# Patient Record
Sex: Male | Born: 1985
Health system: Southern US, Community
[De-identification: ages and names within clinical notes are randomized; demographics above are authoritative.]

## PROBLEM LIST (undated history)

## (undated) ENCOUNTER — Ambulatory Visit: Admission: EM | Payer: BC Managed Care – PPO | Source: Home / Self Care

## (undated) DIAGNOSIS — J189 Pneumonia, unspecified organism: Secondary | ICD-10-CM

## (undated) DIAGNOSIS — F419 Anxiety disorder, unspecified: Secondary | ICD-10-CM

## (undated) DIAGNOSIS — I10 Essential (primary) hypertension: Secondary | ICD-10-CM

## (undated) DIAGNOSIS — R740 Nonspecific elevation of levels of transaminase and lactic acid dehydrogenase [LDH]: Secondary | ICD-10-CM

## (undated) DIAGNOSIS — F909 Attention-deficit hyperactivity disorder, unspecified type: Secondary | ICD-10-CM

## (undated) DIAGNOSIS — E669 Obesity, unspecified: Secondary | ICD-10-CM

## (undated) DIAGNOSIS — E781 Pure hyperglyceridemia: Secondary | ICD-10-CM

## (undated) HISTORY — DX: Anxiety disorder, unspecified: F41.9

## (undated) HISTORY — PX: LEG SURGERY: SHX1003

## (undated) HISTORY — DX: Attention-deficit hyperactivity disorder, unspecified type: F90.9

## (undated) HISTORY — DX: Obesity, unspecified: E66.9

## (undated) HISTORY — DX: Essential (primary) hypertension: I10

## (undated) HISTORY — PX: WRIST ARTHROSCOPY: SUR100

## (undated) HISTORY — DX: Nonspecific elevation of levels of transaminase and lactic acid dehydrogenase (ldh): R74.0

---
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
## 2006-08-31 HISTORY — PX: FRACTURE SURGERY: SHX138

## 2006-12-27 ENCOUNTER — Emergency Department (HOSPITAL_COMMUNITY): Admission: EM | Admit: 2006-12-27 | Discharge: 2006-12-27 | Payer: Self-pay | Admitting: Emergency Medicine

## 2014-01-25 ENCOUNTER — Encounter: Payer: Self-pay | Admitting: Emergency Medicine

## 2014-01-25 ENCOUNTER — Emergency Department (INDEPENDENT_AMBULATORY_CARE_PROVIDER_SITE_OTHER)
Admission: EM | Admit: 2014-01-25 | Discharge: 2014-01-25 | Disposition: A | Discharge status: Home / Self Care | Payer: PRIVATE HEALTH INSURANCE | Source: Home / Self Care | Attending: Emergency Medicine | Admitting: Emergency Medicine

## 2014-01-25 DIAGNOSIS — J069 Acute upper respiratory infection, unspecified: Secondary | ICD-10-CM

## 2014-01-25 MED ORDER — GUAIFENESIN-CODEINE 100-10 MG/5ML PO SYRP: 5.0000 mL | ORAL_SOLUTION | Freq: Four times a day (QID) | ORAL | Status: DC | PRN

## 2014-01-25 MED ORDER — AMOXICILLIN-POT CLAVULANATE 875-125 MG PO TABS: 1.0000 | ORAL_TABLET | Freq: Two times a day (BID) | ORAL | Status: DC

## 2014-01-25 NOTE — ED Notes (Signed)
Dry cough x 3 weeks, body aches, LBP from coughing, hoarseness

## 2014-01-25 NOTE — ED Provider Notes (Signed)
CSN: [id number]     Arrival date & time 01/25/14  1853 History   First MD Initiated Contact with Patient 01/25/14 1857     Chief Complaint  Patient presents with  . Cough   (Consider location/radiation/quality/duration/timing/severity/associated sxs/prior Treatment) HPI Luke Wilkerson is a 28 y.o. male who complains of onset of cold symptoms for 3 weeks.  The symptoms are constant and mild-moderate in severity.  No history of allergic rhinitis.  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  Lots of sick contacts recently.  Using Sudafed which helps. No sore throat + dry cough  No pleuritic pain No wheezing + nasal congestion + post-nasal drainage + hoarseness + sinus pain/pressure +/- chest congestion No itchy/red eyes No earache No hemoptysis No SOB +/- chills/sweats No fever No nausea No vomiting No abdominal pain No diarrhea No skin rashes No fatigue No myalgias No headache     History reviewed. No pertinent past medical history. Past Surgical History  Procedure Laterality Date  . Wrist arthroscopy    . Leg surgery     Family History  Problem Relation Age of Onset  . Hypertension Mother   . Diabetes Father    History  Substance Use Topics  . Smoking status: Current Every Day Smoker  . Smokeless tobacco: Not on file  . Alcohol Use: Yes    Review of Systems  All other systems reviewed and are negative.   Allergies  Review of patient's allergies indicates no known allergies.  Home Medications   Prior to Admission medications   Medication Sig Start Date End Date Taking? Authorizing Provider  amoxicillin-clavulanate (AUGMENTIN) 875-125 MG per tablet Take 1 tablet by mouth 2 (two) times daily. 01/25/14   Marlaine Hind, MD  guaiFENesin-codeine [facility]) 100-10 MG/5ML syrup Take 5 mLs by mouth 4 (four) times daily as needed for cough or congestion. 01/25/14   Marlaine Hind, MD   BP 134/92  Pulse 92  Temp(Src) 98 F (36.7 C) (Oral)  Ht  5\' 11"  (1.803 m)  Wt 228 lb (103.42 kg)  BMI 31.81 kg/m2  SpO2 98% Physical Exam  Nursing note and vitals reviewed. Constitutional: He is oriented to person, place, and time. He appears well-developed and well-nourished.  HENT:  Head: Normocephalic and atraumatic.  Right Ear: Tympanic membrane, external ear and ear canal normal.  Left Ear: Tympanic membrane, external ear and ear canal normal.  Nose: Mucosal edema and rhinorrhea present.  Mouth/Throat: Posterior oropharyngeal erythema present. No oropharyngeal exudate or posterior oropharyngeal edema.  Eyes: No scleral icterus.  Neck: Neck supple.  Cardiovascular: Regular rhythm and normal heart sounds.   Pulmonary/Chest: Effort normal and breath sounds normal. No respiratory distress. He has no decreased breath sounds. He has no wheezes. He has no rhonchi.  Neurological: He is alert and oriented to person, place, and time.  Skin: Skin is warm and dry.  Psychiatric: He has a normal mood and affect. His speech is normal.    ED Course  Procedures (including critical care time) Labs Review Labs Reviewed - No data to display  Imaging Review No results found.   MDM   1. Acute upper respiratory infections of unspecified site    1)  Take the prescribed antibiotic as instructed.  If not improving, would suggest CXR since frequent exposure to fumes.  Pt understands. 2)  Use nasal saline solution (over the counter) at least 3 times a day. 3)  Use over the counter decongestants like Zyrtec-D every 12  hours as needed to help with congestion.  If you have hypertension, do not take medicines with sudafed.  4)  Can take tylenol every 6 hours or motrin every 8 hours for pain or fever. 5)  Follow up with your primary doctor if no improvement in 5-7 days, sooner if increasing pain, fever, or new symptoms.     Marlaine HindJeffrey H Bradd Merlos, MD 01/25/14 [phone]

## 2014-02-08 DIAGNOSIS — M549 Dorsalgia, unspecified: Secondary | ICD-10-CM | POA: Insufficient documentation

## 2014-08-01 ENCOUNTER — Ambulatory Visit
Admission: RE | Admit: 2014-08-01 | Discharge: 2014-08-01 | Disposition: A | Discharge status: Home / Self Care | Payer: Worker's Compensation | Source: Ambulatory Visit | Attending: Internal Medicine | Admitting: Internal Medicine

## 2014-08-01 ENCOUNTER — Other Ambulatory Visit: Payer: Self-pay | Admitting: Internal Medicine

## 2014-08-01 DIAGNOSIS — M79645 Pain in left finger(s): Secondary | ICD-10-CM

## 2014-08-01 DIAGNOSIS — M7989 Other specified soft tissue disorders: Secondary | ICD-10-CM

## 2014-08-31 DIAGNOSIS — J189 Pneumonia, unspecified organism: Secondary | ICD-10-CM

## 2014-08-31 HISTORY — PX: FRACTURE SURGERY: SHX138

## 2014-08-31 HISTORY — DX: Pneumonia, unspecified organism: J18.9

## 2014-12-06 ENCOUNTER — Emergency Department (HOSPITAL_BASED_OUTPATIENT_CLINIC_OR_DEPARTMENT_OTHER)
Admission: EM | Admit: 2014-12-06 | Discharge: 2014-12-06 | Disposition: A | Discharge status: Home / Self Care | Payer: 59 | Attending: Emergency Medicine | Admitting: Emergency Medicine

## 2014-12-06 ENCOUNTER — Emergency Department (HOSPITAL_BASED_OUTPATIENT_CLINIC_OR_DEPARTMENT_OTHER): Payer: 59

## 2014-12-06 ENCOUNTER — Encounter (HOSPITAL_BASED_OUTPATIENT_CLINIC_OR_DEPARTMENT_OTHER): Payer: Self-pay | Admitting: Emergency Medicine

## 2014-12-06 DIAGNOSIS — Y9389 Activity, other specified: Secondary | ICD-10-CM | POA: Insufficient documentation

## 2014-12-06 DIAGNOSIS — Z720 Tobacco use: Secondary | ICD-10-CM | POA: Diagnosis not present

## 2014-12-06 DIAGNOSIS — S82832A Other fracture of upper and lower end of left fibula, initial encounter for closed fracture: Secondary | ICD-10-CM | POA: Insufficient documentation

## 2014-12-06 DIAGNOSIS — X58XXXA Exposure to other specified factors, initial encounter: Secondary | ICD-10-CM | POA: Diagnosis not present

## 2014-12-06 DIAGNOSIS — Y9289 Other specified places as the place of occurrence of the external cause: Secondary | ICD-10-CM | POA: Diagnosis not present

## 2014-12-06 DIAGNOSIS — S99922A Unspecified injury of left foot, initial encounter: Secondary | ICD-10-CM | POA: Diagnosis present

## 2014-12-06 DIAGNOSIS — Z792 Long term (current) use of antibiotics: Secondary | ICD-10-CM | POA: Insufficient documentation

## 2014-12-06 DIAGNOSIS — Y998 Other external cause status: Secondary | ICD-10-CM | POA: Diagnosis not present

## 2014-12-06 DIAGNOSIS — S82402A Unspecified fracture of shaft of left fibula, initial encounter for closed fracture: Secondary | ICD-10-CM

## 2014-12-06 MED ORDER — OXYCODONE-ACETAMINOPHEN 5-325 MG PO TABS: 1.0000 | ORAL_TABLET | Freq: Four times a day (QID) | ORAL | Status: DC | PRN

## 2014-12-06 MED ORDER — KETOROLAC TROMETHAMINE 60 MG/2ML IM SOLN
60.0000 mg | Freq: Once | INTRAMUSCULAR | Status: AC
  Administered 2014-12-06: 60 mg via INTRAMUSCULAR
  Filled 2014-12-06: qty 2

## 2014-12-06 NOTE — ED Provider Notes (Signed)
CSN: [id number]     Arrival date & time 12/06/14  0131 History   First MD Initiated Contact with Patient 12/06/14 0147     Chief Complaint  Patient presents with  . Foot Injury     (Consider location/radiation/quality/duration/timing/severity/associated sxs/prior Treatment) Patient is a 29 y.o. male presenting with foot injury. The history is provided by the patient.  Foot Injury Location:  Ankle Time since incident:  1 hour Injury: yes   Mechanism of injury comment:  Slipped in mud twisting left ankle Ankle location:  L ankle Pain details:    Quality:  Aching   Radiates to:  Does not radiate   Severity:  Severe   Onset quality:  Sudden   Timing:  Constant   Progression:  Unchanged Chronicity:  New Dislocation: no   Foreign body present:  No foreign bodies Prior injury to area:  No Relieved by:  Nothing Worsened by:  Nothing tried Ineffective treatments:  None tried Associated symptoms: swelling   Associated symptoms: no back pain, no muscle weakness, no numbness, no stiffness and no tingling   Risk factors: no concern for non-accidental trauma     History reviewed. No pertinent past medical history. Past Surgical History  Procedure Laterality Date  . Wrist arthroscopy    . Leg surgery     Family History  Problem Relation Age of Onset  . Hypertension Mother   . Diabetes Father    History  Substance Use Topics  . Smoking status: Current Every Day Smoker -- 1.00 packs/day  . Smokeless tobacco: Not on file  . Alcohol Use: Yes    Review of Systems  Musculoskeletal: Negative for back pain and stiffness.  All other systems reviewed and are negative.     Allergies  Review of patient's allergies indicates no known allergies.  Home Medications   Prior to Admission medications   Medication Sig Start Date End Date Taking? Authorizing Provider  amoxicillin-clavulanate (AUGMENTIN) 875-125 MG per tablet Take 1 tablet by mouth 2 (two) times daily. 01/25/14   Marlaine HindJeffrey  H Henderson, MD  guaiFENesin-codeine [facility](ROBITUSSIN AC) 100-10 MG/5ML syrup Take 5 mLs by mouth 4 (four) times daily as needed for cough or congestion. 01/25/14   Marlaine HindJeffrey H Henderson, MD  oxyCODONE-acetaminophen (PERCOCET) 5-325 MG per tablet Take 1 tablet by mouth every 6 (six) hours as needed. 12/06/14   Meela Wareing, MD   BP 137/69 mmHg  Pulse 95  Temp(Src) 98.5 F (36.9 C) (Oral)  Resp 18  Ht 5\' 11"  (1.803 m)  Wt 215 lb (97.523 kg)  BMI 30.00 kg/m2  SpO2 100% Physical Exam  Constitutional: He is oriented to person, place, and time. He appears well-developed and well-nourished. No distress.  HENT:  Head: Normocephalic and atraumatic.  Mouth/Throat: Oropharynx is clear and moist.  Eyes: Conjunctivae are normal. Pupils are equal, round, and reactive to light.  Neck: Normal range of motion. Neck supple.  Cardiovascular: Normal rate, regular rhythm and intact distal pulses.   Pulmonary/Chest: Effort normal and breath sounds normal. No respiratory distress. He has no wheezes. He has no rales.  Abdominal: Soft. Bowel sounds are normal. There is no tenderness. There is no rebound and no guarding.  Musculoskeletal: Normal range of motion.       Left ankle: He exhibits normal range of motion, no swelling, no ecchymosis, no deformity, no laceration and normal pulse. Tenderness. Lateral malleolus tenderness found. No medial malleolus, no AITFL, no CF ligament, no posterior TFL, no head of 5th metatarsal and  no proximal fibula tenderness found. Achilles tendon normal.  Neurological: He is alert and oriented to person, place, and time. He has normal reflexes.  Skin: Skin is warm and dry.  Psychiatric: He has a normal mood and affect.    ED Course  Procedures (including critical care time) Labs Review Labs Reviewed - No data to display  Imaging Review Dg Ankle Complete Left  12/06/2014   CLINICAL DATA:  Fall into [place] with left ankle pain and swelling. Initial encounter.  EXAM: LEFT ANKLE  COMPLETE - 3+ VIEW  COMPARISON:  None.  FINDINGS: There is a coronal oblique fracture of the distal fibula which is essentially nondisplaced. The fracture is above the level of the ankle joint. There is no medial malleolus fracture or medial clear space widening. Hindfoot alignment is normal.  IMPRESSION: 1. Nondisplaced distal fibular fracture. 2. No medial malleolus fracture or ankle mortise widening.   Electronically Signed   By: Marnee Spring M.D.   On: 12/06/2014 02:08   Dg Foot Complete Left  12/06/2014   CLINICAL DATA:  Fall into [place] with left ankle pain and swelling. Initial encounter.  EXAM: LEFT FOOT - COMPLETE 3+ VIEW  COMPARISON:  None currently available  FINDINGS: There is a coronal oblique fracture of the distal fibula which is above the level of the ankle joint.  No evidence of foot fracture or malalignment.  IMPRESSION: 1. Distal fibular fracture, reference ankle imaging. 2. No foot fracture or malalignment.   Electronically Signed   By: Marnee Spring M.D.   On: 12/06/2014 02:06     EKG Interpretation None      MDM   Final diagnoses:  Left fibular fracture, closed, initial encounter   Patient has been drinking this evening.  Will write for percocet as patient states norco does not work, patient is not allowed to drink alcohol, drive or operate heavy machinery on this medication.  Expresses understanding.  Referral to orthopedics.    Cy Blamer, MD 12/06/14 1610

## 2014-12-06 NOTE — Discharge Instructions (Signed)

## 2014-12-06 NOTE — ED Notes (Signed)
Slipped and twisted left ankle  Heard pop  Swelling to ankle  Ice applied

## 2014-12-06 NOTE — ED Notes (Signed)
Pt [place] that [place] and heard pop in left foot

## 2014-12-17 ENCOUNTER — Ambulatory Visit
Admission: RE | Admit: 2014-12-17 | Discharge: 2014-12-17 | Disposition: A | Discharge status: Home / Self Care | Payer: PRIVATE HEALTH INSURANCE | Source: Ambulatory Visit | Attending: Orthopedic Surgery | Admitting: Orthopedic Surgery

## 2014-12-17 ENCOUNTER — Other Ambulatory Visit: Payer: Self-pay | Admitting: Orthopedic Surgery

## 2014-12-17 DIAGNOSIS — Z77018 Contact with and (suspected) exposure to other hazardous metals: Secondary | ICD-10-CM

## 2017-03-12 ENCOUNTER — Ambulatory Visit (INDEPENDENT_AMBULATORY_CARE_PROVIDER_SITE_OTHER): Payer: PRIVATE HEALTH INSURANCE | Admitting: Physician Assistant

## 2017-03-12 ENCOUNTER — Encounter: Payer: Self-pay | Admitting: Physician Assistant

## 2017-03-12 VITALS — BP 145/90 | HR 76 | Ht 71.0 in | Wt 239.0 lb

## 2017-03-12 DIAGNOSIS — Z789 Other specified health status: Secondary | ICD-10-CM | POA: Insufficient documentation

## 2017-03-12 DIAGNOSIS — F909 Attention-deficit hyperactivity disorder, unspecified type: Secondary | ICD-10-CM | POA: Diagnosis not present

## 2017-03-12 DIAGNOSIS — Z9189 Other specified personal risk factors, not elsewhere classified: Secondary | ICD-10-CM

## 2017-03-12 DIAGNOSIS — F4010 Social phobia, unspecified: Secondary | ICD-10-CM

## 2017-03-12 DIAGNOSIS — Z7689 Persons encountering health services in other specified circumstances: Secondary | ICD-10-CM

## 2017-03-12 DIAGNOSIS — R030 Elevated blood-pressure reading, without diagnosis of hypertension: Secondary | ICD-10-CM

## 2017-03-12 DIAGNOSIS — Z131 Encounter for screening for diabetes mellitus: Secondary | ICD-10-CM

## 2017-03-12 DIAGNOSIS — Z720 Tobacco use: Secondary | ICD-10-CM | POA: Diagnosis not present

## 2017-03-12 DIAGNOSIS — R0683 Snoring: Secondary | ICD-10-CM | POA: Diagnosis not present

## 2017-03-12 DIAGNOSIS — F331 Major depressive disorder, recurrent, moderate: Secondary | ICD-10-CM

## 2017-03-12 DIAGNOSIS — Z113 Encounter for screening for infections with a predominantly sexual mode of transmission: Secondary | ICD-10-CM

## 2017-03-12 DIAGNOSIS — Z13220 Encounter for screening for lipoid disorders: Secondary | ICD-10-CM

## 2017-03-12 MED ORDER — AMPHETAMINE-DEXTROAMPHETAMINE 20 MG PO TABS: 20.0000 mg | ORAL_TABLET | ORAL | 0 refills | Status: DC

## 2017-03-12 MED ORDER — ESCITALOPRAM OXALATE 20 MG PO TABS: ORAL_TABLET | ORAL | 1 refills | Status: DC

## 2017-03-12 NOTE — Patient Instructions (Addendum)
For your blood pressure: - Check blood pressure at home for the next 2 weeks - Check around the same time each day in a relaxed setting  - Limit salt. Follow DASH eating plan - Follow-up in 2 weeks  For mood: - start escitalopram (Lexapro) 1/2 tablet every morning for 1 week. Then increase to the full tablet every morning - cut back on alcohol to no more than 2 standard sized drinks per day - follow-up in 4 weeks  For ADHD: - Adderall 20mg  every morning. Monitor your blood pressure closely. Stop if you develop any symptoms of hypertensive emergency such as headache, vision change, chest pain, difficulty breathing    DASH Eating Plan DASH stands for "Dietary Approaches to Stop Hypertension." The DASH eating plan is a healthy eating plan that has been shown to reduce high blood pressure (hypertension). It may also reduce your risk for type 2 diabetes, heart disease, and stroke. The DASH eating plan may also help with weight loss. What are tips for following this plan? General guidelines  Avoid eating more than 2,300 mg (milligrams) of salt (sodium) a day. If you have hypertension, you may need to reduce your sodium intake to 1,500 mg a day.  Limit alcohol intake to no more than 1 drink a day for nonpregnant women and 2 drinks a day for men. One drink equals 12 oz of beer, 5 oz of wine, or 1 oz of hard liquor.  Work with your health care provider to maintain a healthy body weight or to lose weight. Ask what an ideal weight is for you.  Get at least 30 minutes of exercise that causes your heart to beat faster (aerobic exercise) most days of the week. Activities may include walking, swimming, or biking.  Work with your health care provider or diet and nutrition specialist (dietitian) to adjust your eating plan to your individual calorie needs. Reading food labels  Check food labels for the amount of sodium per serving. Choose foods with less than 5 percent of the Daily Value of sodium.  Generally, foods with less than 300 mg of sodium per serving fit into this eating plan.  To find whole grains, look for the word "whole" as the first word in the ingredient list. Shopping  Buy products labeled as "low-sodium" or "no salt added."  Buy fresh foods. Avoid canned foods and premade or frozen meals. Cooking  Avoid adding salt when cooking. Use salt-free seasonings or herbs instead of table salt or sea salt. Check with your health care provider or pharmacist before using salt substitutes.  Do not fry foods. Cook foods using healthy methods such as baking, boiling, grilling, and broiling instead.  Cook with heart-healthy oils, such as olive, canola, soybean, or sunflower oil. Meal planning   Eat a balanced diet that includes: ? 5 or more servings of fruits and vegetables each day. At each meal, try to fill half of your plate with fruits and vegetables. ? Up to 6-8 servings of whole grains each day. ? Less than 6 oz of lean meat, poultry, or fish each day. A 3-oz serving of meat is about the same size as a deck of cards. One egg equals 1 oz. ? 2 servings of low-fat dairy each day. ? A serving of nuts, seeds, or beans 5 times each week. ? Heart-healthy fats. Healthy fats called Omega-3 fatty acids are found in foods such as flaxseeds and coldwater fish, like sardines, salmon, and mackerel.  Limit how much you eat  of the following: ? Canned or prepackaged foods. ? Food that is high in trans fat, such as fried foods. ? Food that is high in saturated fat, such as fatty meat. ? Sweets, desserts, sugary drinks, and other foods with added sugar. ? Full-fat dairy products.  Do not salt foods before eating.  Try to eat at least 2 vegetarian meals each week.  Eat more home-cooked food and less restaurant, buffet, and fast food.  When eating at a restaurant, ask that your food be prepared with less salt or no salt, if possible. What foods are recommended? The items listed may  not be a complete list. Talk with your dietitian about what dietary choices are best for you. Grains Whole-grain or whole-wheat bread. Whole-grain or whole-wheat pasta. Brown rice. [place]. Bulgur. Whole-grain and low-sodium cereals. Pita bread. Low-fat, low-sodium crackers. Whole-wheat flour tortillas. Vegetables Fresh or frozen vegetables (raw, steamed, roasted, or grilled). Low-sodium or reduced-sodium tomato and vegetable juice. Low-sodium or reduced-sodium tomato sauce and tomato paste. Low-sodium or reduced-sodium canned vegetables. Fruits All fresh, dried, or frozen fruit. Canned fruit in natural juice (without added sugar). Meat and other protein foods Skinless chicken or [place]. Ground chicken or [place]. Pork with fat trimmed off. Fish and seafood. Egg whites. Dried beans, peas, or lentils. Unsalted nuts, nut butters, and seeds. Unsalted canned beans. Lean cuts of beef with fat trimmed off. Low-sodium, lean deli meat. Dairy Low-fat (1%) or fat-free (skim) milk. Fat-free, low-fat, or reduced-fat cheeses. Nonfat, low-sodium ricotta or cottage cheese. Low-fat or nonfat yogurt. Low-fat, low-sodium cheese. Fats and oils Soft margarine without trans fats. Vegetable oil. Low-fat, reduced-fat, or light mayonnaise and salad dressings (reduced-sodium). Canola, safflower, olive, soybean, and sunflower oils. Avocado. Seasoning and other foods Herbs. Spices. Seasoning mixes without salt. Unsalted popcorn and pretzels. Fat-free sweets. What foods are not recommended? The items listed may not be a complete list. Talk with your dietitian about what dietary choices are best for you. Grains Baked goods made with fat, such as croissants, muffins, or some breads. Dry pasta or rice meal packs. Vegetables Creamed or fried vegetables. Vegetables in a cheese sauce. Regular canned vegetables (not low-sodium or reduced-sodium). Regular canned tomato sauce and paste (not low-sodium or reduced-sodium).  Regular tomato and vegetable juice (not low-sodium or reduced-sodium). Angie Fava. Olives. Fruits Canned fruit in a light or heavy syrup. Fried fruit. Fruit in cream or butter sauce. Meat and other protein foods Fatty cuts of meat. Ribs. Fried meat. Berniece Salines. Sausage. Bologna and other processed lunch meats. Salami. Fatback. Hotdogs. Bratwurst. Salted nuts and seeds. Canned beans with added salt. Canned or smoked fish. Whole eggs or egg yolks. Chicken or [place] with skin. Dairy Whole or 2% milk, cream, and half-and-half. Whole or full-fat cream cheese. Whole-fat or sweetened yogurt. Full-fat cheese. Nondairy creamers. Whipped toppings. Processed cheese and cheese spreads. Fats and oils Butter. Stick margarine. Lard. Shortening. Ghee. Bacon fat. Tropical oils, such as coconut, palm kernel, or palm oil. Seasoning and other foods Salted popcorn and pretzels. Onion salt, garlic salt, seasoned salt, table salt, and sea salt. Worcestershire sauce. Tartar sauce. Barbecue sauce. Teriyaki sauce. Soy sauce, including reduced-sodium. Steak sauce. Canned and packaged gravies. Fish sauce. Oyster sauce. Cocktail sauce. Horseradish that you find on the shelf. Ketchup. Mustard. Meat flavorings and tenderizers. Bouillon cubes. Hot sauce and Tabasco sauce. Premade or packaged marinades. Premade or packaged taco seasonings. Relishes. Regular salad dressings. Where to find more information:  National Heart, Lung, and Blood Institute: https://wilson-eaton.com/  American Heart  Association: www.heart.org Summary  The DASH eating plan is a healthy eating plan that has been shown to reduce high blood pressure (hypertension). It may also reduce your risk for type 2 diabetes, heart disease, and stroke.  With the DASH eating plan, you should limit salt (sodium) intake to 2,300 mg a day. If you have hypertension, you may need to reduce your sodium intake to 1,500 mg a day.  When on the DASH eating plan, aim to eat more fresh fruits and  vegetables, whole grains, lean proteins, low-fat dairy, and heart-healthy fats.  Work with your health care provider or diet and nutrition specialist (dietitian) to adjust your eating plan to your individual calorie needs. This information is not intended to replace advice given to you by your health care provider. Make sure you discuss any questions you have with your health care provider. Document Released: 08/06/2011 Document Revised: 08/10/2016 Document Reviewed: 08/10/2016 Elsevier Interactive Patient Education  2017 [employer]

## 2017-03-12 NOTE — Progress Notes (Signed)
HPI:                                                                Luke Wilkerson is a 31 y.o. male who presents to [facility]: Primary Care Sports Medicine today to establish care  Current Concerns include ADHD  ADHD: documented history. Has been on Adderall 20mg  immediate release in the past. Has not seen in a doctor in 2 years, so has been off of his medication. States he is starting a new job next week and is worried about poor concentration.  Depression/Anxiety: patient reports history of social anxiety. States he has been avoiding social situations more than usual lately. Endorses depressed mood and anhedonia for a few months. Denies symptoms of mania/hypomania. Denies suicidal thinking. Denies auditory/visual hallucinations.   Patient is also requesting STI screening today. He is sexually active with 1 male partner. They do not use condoms.  Health Maintenance Health Maintenance  Topic Date Due  . HIV Screening  02/22/2002  . INFLUENZA VACCINE  03/31/2017  . TETANUS/TDAP  03/02/2019    Past Medical History:  Diagnosis Date  . ADHD   . Anxiety   . Hypertension   . Obesity    Past Surgical History:  Procedure Laterality Date  . LEG SURGERY    . WRIST ARTHROSCOPY     Social History  Substance Use Topics  . Smoking status: Current Every Day Smoker    Packs/day: 1.00  . Smokeless tobacco: Never Used  . Alcohol use Yes   family history includes Diabetes in his father; Hyperlipidemia in his mother; Hypertension in his mother.  ROS: Review of Systems  Constitutional: Positive for diaphoresis.  HENT: Positive for hearing loss.   Eyes: Positive for blurred vision.  Respiratory: Positive for wheezing ("while sleeping").        + snoring  Genitourinary: Positive for flank pain (right).  Neurological: Positive for dizziness ("after sexual intercourse").  Psychiatric/Behavioral: The patient is nervous/anxious and has insomnia.      Medications: Current Outpatient Prescriptions  Medication Sig Dispense Refill  . amphetamine-dextroamphetamine (ADDERALL) 20 MG tablet Take 1 tablet (20 mg total) by mouth every morning. 30 tablet 0  . escitalopram (LEXAPRO) 20 MG tablet One half tab daily for a week then one tab by mouth daily 30 tablet 1   No current facility-administered medications for this visit.    No Known Allergies   Objective:  BP (!) 145/90   Pulse 76   Ht 5\' 11"  (1.803 m)   Wt 239 lb (108.4 kg)   BMI 33.33 kg/m  Gen: well-groomed, cooperative, not ill-appearing, no distress HEENT: normal conjunctiva, TM's clear, oropharynx clear, moist mucus membranes, no thyromegaly or tenderness Pulm: Normal work of breathing, normal phonation, clear to auscultation bilaterally CV: Normal rate, regular rhythm, s1 and s2 distinct, no murmurs, clicks or rubs, no carotid bruit GI: abdomen soft, nondistended, nontender, no masses Neuro: alert and oriented x 3, EOM's intact, PERRLA, DTR's intact, normal tone, no tremor MSK: moving all extremities, normal gait and station, no peripheral edema Skin: warm and dry, no rashes or lesions on exposed skin Psych: normal affect, euthymic mood, normal speech and thought content    No results found.  Depression screen [facility] 2/9 03/12/2017  Decreased  Interest 3  Down, Depressed, Hopeless 1  PHQ - 2 Score 4  Altered sleeping 3  Tired, decreased energy 3  Change in appetite 3  Feeling bad or failure about yourself  1  Trouble concentrating 1  Moving slowly or fidgety/restless 0  Suicidal thoughts 0  PHQ-9 Score 15   GAD 7 : Generalized Anxiety Score 03/13/2017  Nervous, Anxious, on Edge 3  Control/stop worrying 3  Worry too much - different things 3  Trouble relaxing 3  Restless 3  Easily annoyed or irritable 3  Afraid - awful might happen 3  Total GAD 7 Score 21    Assessment and Plan: 31 y.o. male with   1. Encounter to establish care - reviewed PMH and social  history - reviewed health maintenance  2. Adult ADHD - will monitor HR and blood pressure closely - amphetamine-dextroamphetamine (ADDERALL) 20 MG tablet; Take 1 tablet (20 mg total) by mouth every morning.  Dispense: 30 tablet; Refill: 0  3. Routine screening for STI (sexually transmitted infection) - HIV antibody - Hepatitis C antibody - RPR - GC/Chlamydia Probe Amp  4. Alcohol consumption of more than two drinks per day - CAGE score of 1 - instructed to cut back to 2 standard sized drinks per day  5. Moderate episode of recurrent major depressive disorder (HCC) - PHQ9 score 15 - escitalopram (LEXAPRO) 20 MG tablet; One half tab daily for a week then one tab by mouth daily  Dispense: 30 tablet; Refill: 1  6. Encounter for screening for lipid disorder - Lipid Panel w/reflex Direct LDL  7. Encounter for screening for diabetes mellitus - Hemoglobin A1c  8. Elevated blood pressure reading - patient to check BP's at home for the next 2 weeks - follow-up in 2 weeks for a nurse visit BP check. Bring log - CBC - Comprehensive metabolic panel  9. Social anxiety disorder - GAD7 score 21, severe - starting Lexapro, titrating to 20mg  daily - follow-up in 4 weeks  10. Snoring  11. At risk for obstructive sleep apnea + STOPBANG - will propose sleep study at blood pressure follow-up  12. Tobacco use - patient is not ready to consider smoking cessation   Patient education and anticipatory guidance given Patient agrees with treatment plan Follow-up in 2 weeks or sooner as needed  Levonne Hubertharley E. Cummings PA-C

## 2017-03-13 LAB — GC/CHLAMYDIA PROBE AMP
CT Probe RNA: NOT DETECTED
GC Probe RNA: NOT DETECTED

## 2017-03-24 NOTE — Progress Notes (Signed)
Test negative for gonorrhea and chlamydia Patient did not have his blood drawn

## 2017-03-26 ENCOUNTER — Ambulatory Visit: Payer: Self-pay

## 2017-04-09 ENCOUNTER — Ambulatory Visit (INDEPENDENT_AMBULATORY_CARE_PROVIDER_SITE_OTHER): Payer: 59 | Admitting: Physician Assistant

## 2017-04-09 ENCOUNTER — Encounter: Payer: Self-pay | Admitting: Physician Assistant

## 2017-04-09 VITALS — BP 149/96 | HR 81 | Temp 97.8°F | Wt 239.0 lb

## 2017-04-09 DIAGNOSIS — F331 Major depressive disorder, recurrent, moderate: Secondary | ICD-10-CM | POA: Diagnosis not present

## 2017-04-09 DIAGNOSIS — I10 Essential (primary) hypertension: Secondary | ICD-10-CM

## 2017-04-09 DIAGNOSIS — F4010 Social phobia, unspecified: Secondary | ICD-10-CM

## 2017-04-09 DIAGNOSIS — F909 Attention-deficit hyperactivity disorder, unspecified type: Secondary | ICD-10-CM | POA: Diagnosis not present

## 2017-04-09 MED ORDER — CANDESARTAN CILEXETIL-HCTZ 16-12.5 MG PO TABS: 1.0000 | ORAL_TABLET | Freq: Every day | ORAL | 0 refills | Status: DC

## 2017-04-09 MED ORDER — AMPHETAMINE-DEXTROAMPHETAMINE 20 MG PO TABS: 20.0000 mg | ORAL_TABLET | ORAL | 0 refills | Status: DC

## 2017-04-09 MED ORDER — BUSPIRONE HCL 7.5 MG PO TABS: 7.5000 mg | ORAL_TABLET | Freq: Two times a day (BID) | ORAL | 1 refills | Status: DC

## 2017-04-09 NOTE — Progress Notes (Signed)
HPI:                                                                Bader Stubblefield is a 31 y.o. male who presents to [facility]: [facility] today for anxiety / depression / blood pressure follow-up  HTN: Patient has been monitoring his BP's at home. BP range 140's/80's-90's with a high DBP of 116. Endorses one severe headache that has resolved. Denies vision change, chest pain with exertion, orthopnea, lightheadedness, syncope and edema. Risk factors include: obesity, male sex  Depression/Anxiety: taking Lexapro 20mg  daily without difficulty for 4 weeks. Reports he has noticed no change in his mood. Continues to endorse excessive worry, difficulty relaxing, and irritability. Has panic attacks triggered by social interaction with customers. Continues to endorse anhedonia most days and depressed mood some days. Denies symptoms of mania/hypomania. Denies suicidal thinking. Denies auditory/visual hallucinations.   ADHD: has been taking Adderall 20mg  daily. 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 Denies palpitations and insomnia.   Past Medical History:  Diagnosis Date  . ADHD   . Anxiety   . Hypertension   . Obesity    Past Surgical History:  Procedure Laterality Date  . LEG SURGERY    . WRIST ARTHROSCOPY     Social History  Substance Use Topics  . Smoking status: Current Every Day Smoker    Packs/day: 1.00  . Smokeless tobacco: Never Used  . Alcohol use Yes   family history includes Diabetes in his father; Hyperlipidemia in his mother; Hypertension in his mother.  ROS: negative except as noted in the HPI  Medications: Current Outpatient Prescriptions  Medication Sig Dispense Refill  . amphetamine-dextroamphetamine (ADDERALL) 20 MG tablet Take 1 tablet (20 mg total) by mouth every morning. 30 tablet 0  . escitalopram (LEXAPRO) 20 MG tablet One half tab daily for a week then one tab by mouth  daily 30 tablet 1   No current facility-administered medications for this visit.    No Known Allergies     Objective:  BP (!) 150/96   Pulse 81   Temp 97.8 F (36.6 C)   Wt 239 lb (108.4 kg)   SpO2 97%   BMI 33.33 kg/m  Gen:  alert, not ill-appearing, no distress, appropriate for age HEENT: head normocephalic without obvious abnormality, conjunctiva and cornea clear, trachea midline CV: Normal rate, regular rhythm, s1 and s2 distinct, no murmurs, clicks or rubs  Pulm: Normal work of breathing, normal phonation Neuro: alert and oriented x 3, no tremor MSK: extremities atraumatic, normal gait and station, no peripheral edema Skin: intact, no rashes on exposed skin, no jaundice, no cyanosis Psych: well-groomed, cooperative, good eye contact, euthymic mood, affect mood-congruent, speech is articulate, and thought processes clear and goal-directed    No results found for this or any previous visit (from the past 72 hour(s)). No results found.  Depression screen [facility] 2/9 04/09/2017 03/12/2017  Decreased Interest 3 3  Down, Depressed, Hopeless 1 1  PHQ - 2 Score 4 4  Altered sleeping 0 3  Tired, decreased energy 0 3  Change in appetite 3 3  Feeling bad or failure about yourself  0 1  Trouble concentrating 2 1  Moving slowly or fidgety/restless 0 0  Suicidal thoughts 0 0  PHQ-9 Score 9 15   GAD 7 : Generalized Anxiety Score 03/13/2017  Nervous, Anxious, on Edge 3  Control/stop worrying 3  Worry too much - different things 3  Trouble relaxing 3  Restless 3  Easily annoyed or irritable 3  Afraid - awful might happen 3  Total GAD 7 Score 21      Assessment and Plan: 31 y.o. male with   1. Hypertension goal BP (blood pressure) < 130/80 BP Readings from Last 3 Encounters:  04/09/17 (!) 149/96  03/12/17 (!) 145/90  12/06/14 137/69  - stage II hypertension - therapeutic lifestyle changes - candesartan-hydrochlorothiazide (ATACAND HCT) 16-12.5 MG tablet; Take 1 tablet  by mouth daily.  Dispense: 30 tablet; Refill: 0  2. Moderate episode of recurrent major depressive disorder (HCC) - PHQ9 score 9, mildly moderate, improved from 15, 4 weeks ago; no SI - cont Lexapro 20mg  daily - follow-up in 4 weeks  3. Social anxiety disorder - augmenting Lexapro with Buspar - busPIRone (BUSPAR) 7.5 MG tablet; Take 1 tablet (7.5 mg total) by mouth 2 (two) times daily.  Dispense: 60 tablet; Refill: 1 - follow-up in 4 weeks  4. Adult ADHD - amphetamine-dextroamphetamine (ADDERALL) 20 MG tablet; Take 1 tablet (20 mg total) by mouth every morning.  Dispense: 30 tablet; Refill: 0 - continue to monitor BP closely  Patient education and anticipatory guidance given Patient agrees with treatment plan Follow-up in 1 week for nurse visit BP check or sooner as needed  Levonne Hubertharley E. Cummings PA-C

## 2017-04-09 NOTE — Patient Instructions (Addendum)
For blood pressure: - start candesartan-hctz every morning  - Check your BP everyday and log your readings - Bring your log to your nurse visit in 1 week - Limit salt to less than 1500 mg per day - DASH eating plan - limit alcohol to 1 drink per day - try to cut back on smoking  For anxiety: - Buspirone twice a day - Continue your Lexapro every morning - follow-up in 4 weeks

## 2017-04-16 ENCOUNTER — Ambulatory Visit: Payer: Self-pay

## 2017-05-05 ENCOUNTER — Other Ambulatory Visit: Payer: Self-pay | Admitting: Physician Assistant

## 2017-05-05 DIAGNOSIS — F331 Major depressive disorder, recurrent, moderate: Secondary | ICD-10-CM

## 2017-05-06 ENCOUNTER — Ambulatory Visit: Payer: Self-pay | Admitting: Physician Assistant

## 2017-05-07 ENCOUNTER — Ambulatory Visit: Payer: Self-pay | Admitting: Physician Assistant

## 2017-05-08 ENCOUNTER — Other Ambulatory Visit: Payer: Self-pay | Admitting: Physician Assistant

## 2017-05-08 DIAGNOSIS — I10 Essential (primary) hypertension: Secondary | ICD-10-CM

## 2017-05-10 ENCOUNTER — Encounter: Payer: Self-pay | Admitting: Physician Assistant

## 2017-05-10 ENCOUNTER — Ambulatory Visit (INDEPENDENT_AMBULATORY_CARE_PROVIDER_SITE_OTHER): Payer: 59 | Admitting: Physician Assistant

## 2017-05-10 VITALS — BP 124/84 | HR 116 | Ht 71.0 in | Wt 238.0 lb

## 2017-05-10 DIAGNOSIS — F321 Major depressive disorder, single episode, moderate: Secondary | ICD-10-CM | POA: Diagnosis not present

## 2017-05-10 DIAGNOSIS — R1011 Right upper quadrant pain: Secondary | ICD-10-CM

## 2017-05-10 DIAGNOSIS — R000 Tachycardia, unspecified: Secondary | ICD-10-CM

## 2017-05-10 DIAGNOSIS — F4010 Social phobia, unspecified: Secondary | ICD-10-CM

## 2017-05-10 DIAGNOSIS — I10 Essential (primary) hypertension: Secondary | ICD-10-CM | POA: Diagnosis not present

## 2017-05-10 DIAGNOSIS — F909 Attention-deficit hyperactivity disorder, unspecified type: Secondary | ICD-10-CM | POA: Diagnosis not present

## 2017-05-10 MED ORDER — ESCITALOPRAM OXALATE 10 MG PO TABS: ORAL_TABLET | ORAL | 0 refills | Status: DC

## 2017-05-10 MED ORDER — CYCLOBENZAPRINE HCL 5 MG PO TABS: 5.0000 mg | ORAL_TABLET | Freq: Three times a day (TID) | ORAL | 1 refills | Status: DC | PRN

## 2017-05-10 MED ORDER — BUSPIRONE HCL 7.5 MG PO TABS: 15.0000 mg | ORAL_TABLET | Freq: Two times a day (BID) | ORAL | 1 refills | Status: DC

## 2017-05-10 MED ORDER — AMPHETAMINE-DEXTROAMPHETAMINE 20 MG PO TABS: 20.0000 mg | ORAL_TABLET | ORAL | 0 refills | Status: DC

## 2017-05-10 MED ORDER — SERTRALINE HCL 50 MG PO TABS: 50.0000 mg | ORAL_TABLET | Freq: Every day | ORAL | 5 refills | Status: DC

## 2017-05-10 MED ORDER — CANDESARTAN CILEXETIL-HCTZ 32-12.5 MG PO TABS: 1.0000 | ORAL_TABLET | Freq: Every day | ORAL | 5 refills | Status: DC

## 2017-05-10 NOTE — Progress Notes (Signed)
HPI:                                                                Luke Wilkerson is a 31 y.o. male who presents to [facility]: Primary Care Sports Medicine today for ADHD, HTN follow-up  Patient reports right-sided abdominal pain. Pain is intermittent, moderate, sharp. Reports it is triggered and worsened by physical activity. Denies association with food/eating. Denies change in bowel or bladder function. No hematochezia or melena. Reports father has IBS and is wondering if this could be the cause.   ADHD: doing well on Adderall  daily. Denies headache, palpitations, insomnia.  HTN: not currently on antihypertensive medication. No outside BP's to report. Denies vision change, headache, chest pain with exertion, orthopnea, lightheadedness, syncope and edema. Risk factors include: male sex, obesity, tobacco use  Depression/Anxiety: taking Lexapro  daily without difficulty. He is only taking Buspar as needed. Reports he does not feel like the medication is helping with mood or anxiety. Denies symptoms of mania/hypomania. Denies suicidal thinking. Denies auditory/visual hallucinations.   Past Medical History:  Diagnosis Date  . ADHD   . Anxiety   . Elevated ALT measurement 05/11/2017   AST:ALT 0.5  . Hypertension   . Obesity    Past Surgical History:  Procedure Laterality Date  . LEG SURGERY    . WRIST ARTHROSCOPY     Social History  Substance Use Topics  . Smoking status: Current Every Day Smoker    Packs/day: 1.00  . Smokeless tobacco: Never Used  . Alcohol use Yes   family history includes Diabetes in his father; Hyperlipidemia in his mother; Hypertension in his mother.  ROS: negative except as noted in the HPI  Medications: Current Outpatient Prescriptions  Medication Sig Dispense Refill  . amphetamine-dextroamphetamine (ADDERALL) 20 MG tablet Take 1 tablet (20 mg total) by mouth every morning. 30 tablet 0  . busPIRone (BUSPAR) 7.5 MG tablet  Take 2 tablets (15 mg total) by mouth 2 (two) times daily. 60 tablet 1  . candesartan-hydrochlorothiazide (ATACAND HCT) 32-12.5 MG tablet Take 1 tablet by mouth daily. 30 tablet 5  . cyclobenzaprine (FLEXERIL) 5 MG tablet Take 1 tablet (5 mg total) by mouth 3 (three) times daily as needed for muscle spasms. 30 tablet 1  . escitalopram (LEXAPRO) 10 MG tablet 1 tab PO daily for 1 week, then 1/2 tab for 1 week, then stop 20 tablet 0  . sertraline (ZOLOFT) 50 MG tablet Take 1 tablet (50 mg total) by mouth daily. 30 tablet 5   No current facility-administered medications for this visit.    No Known Allergies     Objective:  BP 124/84 (BP Location: Right Arm, Cuff Size: Large)   Pulse (!) 116   Ht  (1.803 m)   Wt 238 lb (108 kg)   BMI 33.19 kg/m  Gen:  alert, not ill-appearing, no distress, appropriate for age HEENT: head normocephalic without obvious abnormality, conjunctiva and cornea clear, trachea midline Pulm: Normal work of breathing, normal phonation, clear to auscultation  CV: Tachycardic, regular rhythm, s1 and s2 distinct, no murmurs, clicks or rubs  Abdomen: obese, soft, nontender, negative Murphy's sign, exam limited by body habitus Neuro: alert and oriented x 3, no tremor MSK: extremities atraumatic, normal  gait and station Skin: intact, no rashes on exposed skin, no jaundice, no cyanosis Psych: well-groomed, cooperative, good eye contact, depressed mood, affect mood-congruent, speech is articulate, and thought processes clear and goal-directed   [facility] 05/10/2017 04/09/2017 03/12/2017  Decreased Interest 3 3 3   Down, Depressed, Hopeless 2 1 1   PHQ - 2 Score 5 4 4   Altered sleeping 3 0 3  Tired, decreased energy 1 0 3  Change in appetite 3 3 3   Feeling bad or failure about yourself  0 0 1  Trouble concentrating 3 2 1   Moving slowly or fidgety/restless 1 0 0  Suicidal thoughts 0 0 0  PHQ-9 Score 16 9 15   Difficult doing work/chores Somewhat  difficult - -   GAD 7 : Generalized Anxiety Score 05/10/2017 03/13/2017  Nervous, Anxious, on Edge 1 3  Control/stop worrying 3 3  Worry too much - different things 3 3  Trouble relaxing 3 3  Restless 1 3  Easily annoyed or irritable 3 3  Afraid - awful might happen 3 3  Total GAD 7 Score 17 21  Anxiety Difficulty Somewhat difficult -    No results found for this or any previous visit (from the past 72 hour(s)). No results found.  ECG 05/10/2017 Vent Rate 86 bpm PR-I 140 ms QRS 98 ms QT/QTc 366/437 Normal sinus rhythm Left axis deviation  Assessment and Plan: 31 y.o. male with   1. Adult ADHD - will need to continue to monitor BP and HR. Treating hypertension with ARB-thiazide combo. Normal ECG today - amphetamine-dextroamphetamine (ADDERALL) 20 MG tablet; Take 1 tablet (20 mg total) by mouth every morning.  Dispense: 30 tablet; Refill: 0  2. Hypertension goal BP (blood pressure) < 130/80 BP Readings from Last 3 Encounters:  05/10/17 124/84  04/09/17 (!) 149/96  03/12/17 (!) 145/90  - 4 cardiac risk factors - increasing candesartan-hctz from 16-12.5 to 32-12.5 - therapeutic lifestyle changes - candesartan-hydrochlorothiazide (ATACAND HCT) 32-12.5 MG tablet; Take 1 tablet by mouth daily.  Dispense: 30 tablet; Refill: 5  3. Social anxiety disorder, MDD - GAD7 score 17, severe. PHQ9 16, moderately severe, worsened from 1 month ago. No improvement with 3 months of Lexapro.  - Switching to Zoloft. Recommend scheduled Buspar for better anxiety control. Follow-up in 4 weeks - sertraline (ZOLOFT) 50 MG tablet; Take 1 tablet (50 mg total) by mouth daily.  Dispense: 30 tablet; Refill: 5 - busPIRone (BUSPAR) 7.5 MG tablet; Take 2 tablets (15 mg total) by mouth 2 (two) times daily.  Dispense: 60 tablet; Refill: 1  4. Tachycardia with heart rate 100-120 beats per minute - personally reviewed ECG, which was normal sinus rhythm without ischemic changes, dysrhythmias or conduction  abnormalities. There was evidence of left axis deviation - TSH - T3, free - T4, free - EKG 12-Lead  5. RUQ abdominal pain - suspect this is musculoskeletal. Differential also includes gallbladder pathology, NAFL, alcohol-induced hepatitis - trial Flexeril as needed - Comprehensive metabolic panel - Lipase - Amylase - DG Ribs Unilateral W/Chest Right - US Abdomen Limited RUQ; Future - cyclobenzaprine (FLEXERIL) 5 MG tablet; Take 1 tablet (5 mg total) by mouth 3 (three) times daily as needed for muscle spasms.  Dispense: 30 tablet; Refill: 1   Patient education and anticipatory guidance given Patient agrees with treatment plan Follow-up in 4 weeks or sooner as needed if symptoms worsen or fail to improve  I spent 40 minutes with this patient, greater than 50% was face-to-face time  counseling regarding the above diagnoses   Darlyne Russian PA-C

## 2017-05-10 NOTE — Patient Instructions (Addendum)
For your blood pressure: - Start new blood pressure medicine Candesartan  with HCTZ 12.5mg  - [place]ine to monitor and log BP's at home. Goal <130/80 - Limit salt to <1500 mg/day - Follow DASH eating plan - limit alcohol to 2 standard drinks per day - avoid tobacco products - weight loss: 7% of current body weight - Follow-up in 4 weeks  For mood/anxiety: - Take Lexapro  daily for 1 week, then  daily, then stop - At the same time start Sertraline 1 tablet daily - Take Buspar  twice daily for anxiety  For right upper abdominal pain - X-ray and labs today - You will be contacted to schedule an ultrasound - Flexeril up to three times daily as needed for spasm/pain - Avoid activities that provoke pain. Ice as needed

## 2017-05-11 ENCOUNTER — Encounter: Payer: Self-pay | Admitting: Physician Assistant

## 2017-05-11 DIAGNOSIS — R7401 Elevation of levels of liver transaminase levels: Secondary | ICD-10-CM

## 2017-05-11 DIAGNOSIS — R740 Nonspecific elevation of levels of transaminase and lactic acid dehydrogenase [LDH]: Secondary | ICD-10-CM

## 2017-05-11 HISTORY — DX: Elevation of levels of liver transaminase levels: R74.01

## 2017-05-11 LAB — COMPREHENSIVE METABOLIC PANEL
AG Ratio: 1.7 (calc) (ref 1.0–2.5)
ALT: 68 U/L — ABNORMAL HIGH (ref 9–46)
AST: 35 U/L (ref 10–40)
Albumin: 4.7 g/dL (ref 3.6–5.1)
Alkaline phosphatase (APISO): 79 U/L (ref 40–115)
BUN: 17 mg/dL (ref 7–25)
CO2: 28 mmol/L (ref 20–32)
Calcium: 9.9 mg/dL (ref 8.6–10.3)
Chloride: 98 mmol/L (ref 98–110)
Creat: 0.89 mg/dL (ref 0.60–1.35)
GLOBULIN: 2.8 g/dL (ref 1.9–3.7)
GLUCOSE: 91 mg/dL (ref 65–99)
Potassium: 3.9 mmol/L (ref 3.5–5.3)
SODIUM: 137 mmol/L (ref 135–146)
TOTAL PROTEIN: 7.5 g/dL (ref 6.1–8.1)
Total Bilirubin: 0.6 mg/dL (ref 0.2–1.2)

## 2017-05-11 LAB — T3, FREE: T3, Free: 3.6 pg/mL (ref 2.3–4.2)

## 2017-05-11 LAB — TSH: TSH: 0.98 mIU/L (ref 0.40–4.50)

## 2017-05-11 LAB — AMYLASE: Amylase: 49 U/L (ref 21–101)

## 2017-05-11 LAB — T4, FREE: FREE T4: 0.9 ng/dL (ref 0.8–1.8)

## 2017-05-11 LAB — LIPASE: Lipase: 34 U/L (ref 7–60)

## 2017-05-11 NOTE — Progress Notes (Signed)
Labs show elevated liver enzyme. This can be caused by a number of things including fatty liver disease and alcohol. It can cause right upper abdominal pain. Recommend proceeding with the ultrasound Recommend tapering off of alcohol. Start with reducing by 1 drink per day

## 2017-05-21 ENCOUNTER — Other Ambulatory Visit: Payer: Self-pay

## 2017-05-25 ENCOUNTER — Ambulatory Visit (INDEPENDENT_AMBULATORY_CARE_PROVIDER_SITE_OTHER): Payer: 59

## 2017-05-25 DIAGNOSIS — R1011 Right upper quadrant pain: Secondary | ICD-10-CM

## 2017-05-25 DIAGNOSIS — K760 Fatty (change of) liver, not elsewhere classified: Secondary | ICD-10-CM | POA: Diagnosis not present

## 2017-05-25 NOTE — Progress Notes (Signed)
Ultrasound does show fatty liver disease Recommendations are limiting alcohol to 2 drinks per day, preferably abstaining from alcohol altogether Low-fat diet Weight loss Repeat your labs in 6 months

## 2017-06-03 ENCOUNTER — Other Ambulatory Visit: Payer: Self-pay | Admitting: Physician Assistant

## 2017-06-03 DIAGNOSIS — F331 Major depressive disorder, recurrent, moderate: Secondary | ICD-10-CM

## 2017-06-09 ENCOUNTER — Ambulatory Visit: Payer: Self-pay | Admitting: Physician Assistant

## 2017-06-09 DIAGNOSIS — Z0189 Encounter for other specified special examinations: Secondary | ICD-10-CM

## 2017-06-21 ENCOUNTER — Ambulatory Visit (INDEPENDENT_AMBULATORY_CARE_PROVIDER_SITE_OTHER): Payer: 59 | Admitting: Physician Assistant

## 2017-06-21 VITALS — BP 125/83 | HR 97 | Temp 98.1°F | Wt 242.0 lb

## 2017-06-21 DIAGNOSIS — Z09 Encounter for follow-up examination after completed treatment for conditions other than malignant neoplasm: Secondary | ICD-10-CM

## 2017-06-21 DIAGNOSIS — K610 Anal abscess: Secondary | ICD-10-CM

## 2017-06-21 MED ORDER — DOXYCYCLINE HYCLATE 100 MG PO TABS: 100.0000 mg | ORAL_TABLET | Freq: Two times a day (BID) | ORAL | 0 refills | Status: DC

## 2017-06-21 NOTE — Patient Instructions (Addendum)
- Stop Keflex - Start Doxycycline twice a day - Warm compresses to left flank 4 times daily - Follow-up in 1 week   Perianal Abscess An abscess is an infected area that is filled with pus. A perianal abscess occurs in the perineum, which is the area between the anus and the scrotum in males and between the anus and the vagina in females. Perianal abscesses can vary in size. Without treatment, a perianal abscess can become larger and cause other problems. What are the causes? This condition is caused by:  Waste from damaged or dead tissue (debris) that plugs up glands in the perineum. When this happens, an abscess may form.  Infections of the perineum.  What are the signs or symptoms? Common symptoms of this condition include:  Swelling and redness in the area of the abscess. The redness may go beyond the abscess and appear as a red streak on the skin.  Pain in the area of the abscess, including pain when sitting, walking, or passing stool.  Other possible symptoms include:  A visible, painful lump, or a lump that can be felt when touched.  Bleeding or pus-like discharge from the area.  Fever.  General weakness.  How is this diagnosed? This condition is diagnosed based on your medical history and a physical exam of the affected area.  This may involve examining the rectal area with a gloved hand (digital rectal exam).  Sometimes, the health care provider needs to look into the rectum using a probe or a scope.  For women, it may require a careful vaginal exam.  How is this treated? Treatment for this condition may include:  Making a cut (incision) in the abscess to drain the pus. This can sometimes be done in your health care provider's office or an emergency department after you are given medicine to numb the area (local anesthetic).  Surgery to drain the abscess. This is for larger or deeper abscesses.  Antibiotic medicines, if there is infection in the surrounding  tissue (cellulitis).  Having gauze packed into the abscess to continue draining the area.  Frequent baths in warm water that is deep enough to cover your hips and buttocks (sitz baths). These help the wound heal and they make the abscess less likely to come back.  Follow these instructions at home: Medicines  Take over-the-counter and prescription medicines for pain, fever, or discomfort only as told by your health care provider.  If you were prescribed an antibiotic medicine, use it as told by your health care provider. Do not stop using the antibiotic even if you start to feel better.  Do not drive or use heavy machinery while taking prescription pain medicine. Wound care   Keep the skin around the wound clean and dry. Avoid cleaning the area too much.  Avoid scratching the wound.  Avoid using colored or perfumed toilet papers.  Take a sitz bath 3-4 times a day and after bowel movements. This will help reduce pain and swelling.  If directed, apply ice to the injured area: ? Put ice in a plastic bag. ? Place a towel between your skin and the bag. ? Leave the ice on for 20 minutes, 2-3 times a day.  Check your incision area every day for signs of infection. Check for: ? More redness, swelling, or pain. ? More fluid or blood. ? Warmth. ? Pus or a bad smell. Gauze  If gauze was used in the abscess, follow instructions from your health care provider about  removing or changing the gauze. It can usually be removed in 2-3 days.  Wash your hands with soap and water before you remove or change your gauze. If soap and water are not available, use hand sanitizer.  If one or more drains were placed in the abscess cavity, be careful not to pull at them. Your health care provider will tell you how long they need to remain in place. General instructions  Keep all follow-up visits as told by your health care provider. This is important. Contact a health care provider if:  You have  trouble passing stool or passing urine.  Your pain or swelling in the affected area does not seem to be getting better.  The gauze packing or the drains come out before the planned time. Get help right away if:  You have problems moving or using your legs.  You have severe or increasing pain.  Your swelling in the affected area suddenly gets worse.  You have a large increase in bleeding or passing of pus.  You have chills or a fever. This information is not intended to replace advice given to you by your health care provider. Make sure you discuss any questions you have with your health care provider. Document Released: 09/23/2006 Document Revised: 03/06/2016 Document Reviewed: 01/27/2016 Elsevier Interactive Patient Education  [employer].

## 2017-06-21 NOTE — Progress Notes (Signed)
HPI:                                                                Luke Wilkerson is a 31 y.o. male [name] today for hospital follow-up  [facility] for a perianal abscess. He had a CT abdomen/pelvis, which was negative for fistula. He was also found to have a mild leukocytosis of 16.1.  Abscess was incised and drained under local anesthesia in the ED, packed with 1/4" iodoform, and he was discharged on Keflex. Gram stain showed few gram positive cocci in pairs and chains. Patient reports he removed the packing accidentally today. He is taking the Keflex. Reports area is still painful, but improved. Denies fever, chills, malaise.  Past Medical History:  Diagnosis Date  . ADHD   . Anxiety   . Elevated ALT measurement 05/11/2017   AST:ALT 0.5  . Hypertension   . Obesity    Past Surgical History:  Procedure Laterality Date  . LEG SURGERY    . WRIST ARTHROSCOPY     Social History  Substance Use Topics  . Smoking status: Current Every Day Smoker    Packs/day: 1.00  . Smokeless tobacco: Never Used  . Alcohol use Yes   family history includes Diabetes in his father; Hyperlipidemia in his mother; Hypertension in his mother.  ROS: negative except as noted in the HPI  Medications: Current Outpatient Prescriptions  Medication Sig Dispense Refill  . amphetamine-dextroamphetamine (ADDERALL) 20 MG tablet Take 1 tablet (20 mg total) by mouth every morning. 30 tablet 0  . busPIRone (BUSPAR) 7.5 MG tablet Take 2 tablets (15 mg total) by mouth 2 (two) times daily. 60 tablet 1  . candesartan-hydrochlorothiazide (ATACAND HCT) 32-12.5 MG tablet Take 1 tablet by mouth daily. 30 tablet 5  . cyclobenzaprine (FLEXERIL) 5 MG tablet Take 1 tablet (5 mg total) by mouth 3 (three) times daily as needed for muscle spasms. 30 tablet 1  . doxycycline (VIBRA-TABS) 100 MG tablet Take 1 tablet (100 mg  total) by mouth 2 (two) times daily. 14 tablet 0  . escitalopram (LEXAPRO) 20 MG tablet TAKE 1 TABLET (20 MG TOTAL) BY MOUTH DAILY. 30 tablet 0  . sertraline (ZOLOFT) 50 MG tablet Take 1 tablet (50 mg total) by mouth daily. 30 tablet 5   No current facility-administered medications for this visit.    No Known Allergies     Objective:  BP 125/83   Pulse 97   Temp 98.1 F (36.7 C)   Wt 242 lb (109.8 kg)   BMI 33.75 kg/m  Gen:  alert, not ill-appearing, no distress, appropriate for age HEENT: head normocephalic without obvious abnormality, conjunctiva and cornea clear, trachea midline Pulm: Normal work of breathing, normal phonation  Neuro: alert and oriented x 3, no tremor MSK: extremities atraumatic, normal gait and station Skin: medial aspect of left buttock with approximately 1cm open wound with surrounding erythema, no purulent drainage, no induration     No results found for this or any previous visit (from the past 72 hour(s)). No results found.    Assessment and Plan: 31 y.o. male with   1. Hospital discharge follow-up - personally reviewed Care Everywhere including  CT abdomen/pelvis report from 06/19/17  2. Perianal abscess - reviewed wound culture, which suggests Streptococcus or Enterococcus. No susceptibility report - discontinuing Keflex and switching to Doxy for MRSA coverage - continue to keep area clean and dry. Did not repack today. - doxycycline (VIBRA-TABS) 100 MG tablet; Take 1 tablet (100 mg total) by mouth 2 (two) times daily.  Dispense: 14 tablet; Refill: 0   Patient education and anticipatory guidance given Patient agrees with treatment plan Follow-up in 1 week or sooner as needed if symptoms worsen or fail to improve  Levonne Hubert PA-C

## 2017-06-23 ENCOUNTER — Encounter: Payer: Self-pay | Admitting: Physician Assistant

## 2017-06-28 ENCOUNTER — Ambulatory Visit (INDEPENDENT_AMBULATORY_CARE_PROVIDER_SITE_OTHER): Payer: 59 | Admitting: Physician Assistant

## 2017-06-28 ENCOUNTER — Encounter: Payer: Self-pay | Admitting: Physician Assistant

## 2017-06-28 VITALS — BP 143/86 | HR 94 | Wt 249.0 lb

## 2017-06-28 DIAGNOSIS — I10 Essential (primary) hypertension: Secondary | ICD-10-CM | POA: Diagnosis not present

## 2017-06-28 DIAGNOSIS — K760 Fatty (change of) liver, not elsewhere classified: Secondary | ICD-10-CM | POA: Diagnosis not present

## 2017-06-28 DIAGNOSIS — K610 Anal abscess: Secondary | ICD-10-CM

## 2017-06-28 MED ORDER — LISINOPRIL-HYDROCHLOROTHIAZIDE 20-25 MG PO TABS: 1.0000 | ORAL_TABLET | Freq: Every day | ORAL | 5 refills | Status: DC

## 2017-06-28 NOTE — Progress Notes (Signed)
HPI:                                                                Luke Wilkerson is a 31 y.o. male who presents to [name] today for follow-up of perianal abscess  Patient was seen at [facility] on 06/19/2017 for a perianal abscess. He had a CT abdomen/pelvis, which was negative for fistula. He was also found to have a mild leukocytosis of 16.1.  Abscess was incised and drained under local anesthesia in the ED, packed with 1/4" iodoform, and he was discharged on Keflex. Gram stain showed few gram positive cocci in pairs and chains. Patient reports he removed the packing accidentally today.   He completed Doxycycline. Reports abscess is no longer draining or painful. Denies fever, chills, malaise.  HTN: taking candesartan-hctz daily. Compliant with medications. Checks BP's at home. BP range 116-140/80's-90's. Denies vision change, headache, chest pain with exertion, orthopnea, lightheadedness, syncope and edema. Risk factors include: male sex, obesity   Past Medical History:  Diagnosis Date  . ADHD   . Anxiety   . Elevated ALT measurement 05/11/2017   AST:ALT 0.5  . Hypertension   . Obesity    Past Surgical History:  Procedure Laterality Date  . LEG SURGERY    . WRIST ARTHROSCOPY     Social History  Substance Use Topics  . Smoking status: Current Every Day Smoker    Packs/day: 1.00  . Smokeless tobacco: Never Used  . Alcohol use Yes   family history includes Diabetes in his father; Hyperlipidemia in his mother; Hypertension in his mother.  ROS: negative except as noted in the HPI  Medications: Current Outpatient Prescriptions  Medication Sig Dispense Refill  . amphetamine-dextroamphetamine (ADDERALL) 20 MG tablet Take 1 tablet (20 mg total) by mouth every morning. 30 tablet 0  . busPIRone (BUSPAR) 7.5 MG tablet Take 2 tablets (15 mg total) by mouth 2 (two) times daily. 60 tablet 1  .  candesartan-hydrochlorothiazide (ATACAND HCT) 32-12.5 MG tablet Take 1 tablet by mouth daily. 30 tablet 5  . cyclobenzaprine (FLEXERIL) 5 MG tablet Take 1 tablet (5 mg total) by mouth 3 (three) times daily as needed for muscle spasms. 30 tablet 1  . escitalopram (LEXAPRO) 20 MG tablet TAKE 1 TABLET (20 MG TOTAL) BY MOUTH DAILY. 30 tablet 0  . sertraline (ZOLOFT) 50 MG tablet Take 1 tablet (50 mg total) by mouth daily. 30 tablet 5   No current facility-administered medications for this visit.    No Known Allergies     Objective:  BP (!) 146/92   Pulse 94   Wt 249 lb (112.9 kg)   BMI 34.73 kg/m  Gen:  alert, not ill-appearing, no distress, appropriate for age HEENT: head normocephalic without obvious abnormality, conjunctiva and cornea clear, trachea midline Pulm: Normal work of breathing, normal phonation, clear to auscultation bilaterally, no wheezes, rales or rhonchi CV: Normal rate, regular rhythm, s1 and s2 distinct, no murmurs, clicks or rubs  Neuro: alert and oriented x 3, no tremor MSK: extremities atraumatic, normal gait and station Skin: medial aspect of left buttock with approximately 1cm healing incision without surrounding erythema, no purulent drainage, no induration     No results found for this or  any previous visit (from the past 72 hour(s)). No results found.    Assessment and Plan: 31 y.o. male with   1. Perianal abscess - clinically improved  2. Hypertension goal BP (blood pressure) < 130/80 BP Readings from Last 3 Encounters:  06/28/17 (!) 143/86  06/21/17 125/83  05/10/17 124/84  - increasing HCTZ, switching from candesartan to Lisinopril due to cost - therapeutic lifestyle changes - lisinopril-hydrochlorothiazide (PRINZIDE,ZESTORETIC) 20-25 MG tablet; Take 1 tablet by mouth daily.  Dispense: 30 tablet; Refill: 5  3. NAFL (nonalcoholic fatty liver) Lab Results  Component Value Date   ALT 68 (H) 05/10/2017   AST 35 05/10/2017   BILITOT 0.6  05/10/2017  - fatty infiltration of liver on RUQ ultrasound - recheck hepatic function panel today - counseled on low-fat diet, abstaining/limiting alcohol, and weight loss through decreasing calories and increasing exercise  Patient education and anticipatory guidance given Patient agrees with treatment plan Follow-up in 2 weeks for nurse visit BP check or sooner as needed if symptoms worsen or fail to improve  Levonne Hubert PA-C

## 2017-06-28 NOTE — Patient Instructions (Addendum)
For your blood pressure: - Switch to Lisinopril-HCTZ every morning - Check blood pressure at home for the next 2 weeks - Check around the same time each day in a relaxed setting - Limit salt to <2000 mg/day - Follow DASH eating plan - limit alcohol to 2 standard drinks per day - avoid tobacco products - weight loss: 7% of current body weight - Follow-up in 2 weeks for a nurse visit BP check   For liver: - low-fat diet - avoid/limit alcohol - weight loss - recheck liver enzymes (blood work) this week

## 2017-07-12 ENCOUNTER — Ambulatory Visit (INDEPENDENT_AMBULATORY_CARE_PROVIDER_SITE_OTHER): Payer: 59 | Admitting: Physician Assistant

## 2017-07-12 VITALS — BP 151/87 | HR 97

## 2017-07-12 DIAGNOSIS — R0683 Snoring: Secondary | ICD-10-CM | POA: Diagnosis not present

## 2017-07-12 DIAGNOSIS — I10 Essential (primary) hypertension: Secondary | ICD-10-CM | POA: Diagnosis not present

## 2017-07-12 DIAGNOSIS — Z9189 Other specified personal risk factors, not elsewhere classified: Secondary | ICD-10-CM

## 2017-07-12 MED ORDER — METOPROLOL SUCCINATE ER 50 MG PO TB24: 50.0000 mg | ORAL_TABLET | Freq: Every day | ORAL | 3 refills | Status: DC

## 2017-07-12 NOTE — Patient Instructions (Addendum)
Blood pressure is not responding well to medication. This is likely due to uncontrolled sleep apnea. Recommend a sleep study asap.  You also need to work on quitting smoking. We can prescribe medications to help you with this. Please make an appointment for smoking cessation when you are ready  For your blood pressure: - Continue current meds - Start Metoprolol daily  - Limit salt to <2000 mg/day - Follow DASH eating plan - limit alcohol to 2 standard drinks per day - start smoking cessation - weight loss: 7% of current body weight - Follow-up in 2 weeks   Sleep Apnea Sleep apnea is a condition in which breathing pauses or becomes shallow during sleep. Episodes of sleep apnea usually last 10 seconds or longer, and they may occur as many as 20 times an hour. Sleep apnea disrupts your sleep and keeps your body from getting the rest that it needs. This condition can increase your risk of certain health problems, including:  Heart attack.  Stroke.  Obesity.  Diabetes.  Heart failure.  Irregular heartbeat.  There are three kinds of sleep apnea:  Obstructive sleep apnea. This kind is caused by a blocked or collapsed airway.  Central sleep apnea. This kind happens when the part of the brain that controls breathing does not send the correct signals to the muscles that control breathing.  Mixed sleep apnea. This is a combination of obstructive and central sleep apnea.  What are the causes? The most common cause of this condition is a collapsed or blocked airway. An airway can collapse or become blocked if:  Your throat muscles are abnormally relaxed.  Your tongue and tonsils are larger than normal.  You are overweight.  Your airway is smaller than normal.  What increases the risk? This condition is more likely to develop in people who:  Are overweight.  Smoke.  Have a smaller than normal airway.  Are elderly.  Are male.  Drink alcohol.  Take sedatives or  tranquilizers.  Have a family history of sleep apnea.  What are the signs or symptoms? Symptoms of this condition include:  Trouble staying asleep.  Daytime sleepiness and tiredness.  Irritability.  Loud snoring.  Morning headaches.  Trouble concentrating.  Forgetfulness.  Decreased interest in sex.  Unexplained sleepiness.  Mood swings.  Personality changes.  Feelings of depression.  Waking up often during the night to urinate.  Dry mouth.  Sore throat.  How is this diagnosed? This condition may be diagnosed with:  A medical history.  A physical exam.  A series of tests that are done while you are sleeping (sleep study). These tests are usually done in a sleep lab, but they may also be done at home.  How is this treated? Treatment for this condition aims to restore normal breathing and to ease symptoms during sleep. It may involve managing health issues that can affect breathing, such as high blood pressure or obesity. Treatment may include:  Sleeping on your side.  Using a decongestant if you have nasal congestion.  Avoiding the use of depressants, including alcohol, sedatives, and narcotics.  Losing weight if you are overweight.  Making changes to your diet.  Quitting smoking.  Using a device to open your airway while you sleep, such as: ? An oral appliance. This is a custom-made mouthpiece that shifts your lower jaw forward. ? A continuous positive airway pressure (CPAP) device. This device delivers oxygen to your airway through a mask. ? A nasal expiratory positive airway pressure (  EPAP) device. This device has valves that you put into each nostril. ? A bi-level positive airway pressure (BPAP) device. This device delivers oxygen to your airway through a mask.  Surgery if other treatments do not work. During surgery, excess tissue is removed to create a wider airway.  It is important to get treatment for sleep apnea. Without treatment, this  condition can lead to:  High blood pressure.  Coronary artery disease.  (Men) An inability to achieve or maintain an erection (impotence).  Reduced thinking abilities.  Follow these instructions at home:  Make any lifestyle changes that your health care provider recommends.  Eat a healthy, well-balanced diet.  Take over-the-counter and prescription medicines only as told by your health care provider.  Avoid using depressants, including alcohol, sedatives, and narcotics.  Take steps to lose weight if you are overweight.  If you were given a device to open your airway while you sleep, use it only as told by your health care provider.  Do not use any tobacco products, such as cigarettes, chewing tobacco, and e-cigarettes. If you need help quitting, ask your health care provider.  Keep all follow-up visits as told by your health care provider. This is important. Contact a health care provider if:  The device that you received to open your airway during sleep is uncomfortable or does not seem to be working.  Your symptoms do not improve.  Your symptoms get worse. Get help right away if:  You develop chest pain.  You develop shortness of breath.  You develop discomfort in your back, arms, or stomach.  You have trouble speaking.  You have weakness on one side of your body.  You have drooping in your face. These symptoms may represent a serious problem that is an emergency. Do not wait to see if the symptoms will go away. Get medical help right away. Call your local emergency services (911 in the [place] Do not drive yourself to the hospital. This information is not intended to replace advice given to you by your health care provider. Make sure you discuss any questions you have with your health care provider. Document Released: 08/07/2002 Document Revised: 04/12/2016 Document Reviewed: 05/27/2015 Elsevier Interactive Patient Education  [employer]2018 [employer]

## 2017-07-12 NOTE — Progress Notes (Signed)
Pt came into clinic today for BP check. At last OV he was elevated. Today he was above goal. Spoke with PCP, new Rx added and sleep study ordered. Pt advised to return in 2 weeks.

## 2017-07-15 ENCOUNTER — Telehealth: Payer: Self-pay | Admitting: *Deleted

## 2017-07-15 DIAGNOSIS — I10 Essential (primary) hypertension: Secondary | ICD-10-CM

## 2017-07-15 DIAGNOSIS — Z9189 Other specified personal risk factors, not elsewhere classified: Secondary | ICD-10-CM

## 2017-07-15 DIAGNOSIS — R0683 Snoring: Secondary | ICD-10-CM

## 2017-07-15 NOTE — Telephone Encounter (Signed)
[facility] that [facility] pt's ins will only cover an in-lab sleep study so I placed a new order.

## 2017-07-26 ENCOUNTER — Ambulatory Visit: Payer: Self-pay

## 2017-07-31 ENCOUNTER — Ambulatory Visit (HOSPITAL_BASED_OUTPATIENT_CLINIC_OR_DEPARTMENT_OTHER): Payer: 59 | Attending: Physician Assistant

## 2017-08-22 DIAGNOSIS — X58XXXA Exposure to other specified factors, initial encounter: Secondary | ICD-10-CM | POA: Diagnosis not present

## 2017-08-22 DIAGNOSIS — J189 Pneumonia, unspecified organism: Secondary | ICD-10-CM | POA: Diagnosis not present

## 2017-08-22 DIAGNOSIS — S299XXA Unspecified injury of thorax, initial encounter: Secondary | ICD-10-CM | POA: Diagnosis not present

## 2017-08-22 DIAGNOSIS — R509 Fever, unspecified: Secondary | ICD-10-CM | POA: Diagnosis not present

## 2017-08-23 ENCOUNTER — Other Ambulatory Visit: Payer: Self-pay

## 2017-08-23 ENCOUNTER — Emergency Department (INDEPENDENT_AMBULATORY_CARE_PROVIDER_SITE_OTHER)
Admission: EM | Admit: 2017-08-23 | Discharge: 2017-08-23 | Disposition: A | Discharge status: Home / Self Care | Payer: 59 | Source: Home / Self Care | Attending: Family Medicine | Admitting: Family Medicine

## 2017-08-23 DIAGNOSIS — R0789 Other chest pain: Secondary | ICD-10-CM

## 2017-08-23 DIAGNOSIS — J209 Acute bronchitis, unspecified: Secondary | ICD-10-CM

## 2017-08-23 DIAGNOSIS — R062 Wheezing: Secondary | ICD-10-CM

## 2017-08-23 MED ORDER — METHYLPREDNISOLONE SODIUM SUCC 125 MG IJ SOLR
125.0000 mg | Freq: Once | INTRAMUSCULAR | Status: AC
  Administered 2017-08-23: 125 mg via INTRAMUSCULAR

## 2017-08-23 MED ORDER — BENZONATATE 100 MG PO CAPS: 100.0000 mg | ORAL_CAPSULE | Freq: Three times a day (TID) | ORAL | 0 refills | Status: DC

## 2017-08-23 MED ORDER — CYCLOBENZAPRINE HCL 5 MG PO TABS: 5.0000 mg | ORAL_TABLET | Freq: Two times a day (BID) | ORAL | 0 refills | Status: DC | PRN

## 2017-08-23 MED ORDER — PREDNISONE 20 MG PO TABS: ORAL_TABLET | ORAL | 0 refills | Status: DC

## 2017-08-23 NOTE — ED Provider Notes (Signed)
[facility]    CSN: [id number] Arrival date & time: 08/23/17  0935     History   Chief Complaint Chief Complaint  Patient presents with  . Cough  . Chest Pain    left    HPI Luke Wilkerson is a 31 y.o. male.   HPI  Luke Wilkerson is a 31 y.o. male presenting to UC with c/o continued Left side chest pain that is worse with cough or deep inspiration. [facility] [place] [facility] for same. He was given a shot of Rocephin and discharged home with Augmentin, Azithromycin, Ventolin inhaler, and Norco.  He believes he was told he has a few broken ribs and is worried he will run out of pain medication over Christmas tomorrow.  Denies fever, chills, n/v/d. Denies hx of asthma.    Past Medical History:  Diagnosis Date  . ADHD   . Anxiety   . Elevated ALT measurement 05/11/2017   AST:ALT 0.5  . Hypertension   . Obesity     Patient Active Problem List   Diagnosis Date Noted  . Perianal abscess 06/21/2017  . NAFL (nonalcoholic fatty liver) 05/25/2017  . Elevated ALT measurement 05/11/2017  . RUQ abdominal pain 05/10/2017  . Hypertension goal BP (blood pressure) < 130/80 04/09/2017  . Alcohol consumption of more than two drinks per day 03/12/2017  . Adult ADHD 03/12/2017  . Elevated blood pressure reading 03/12/2017  . Moderate episode of recurrent major depressive disorder (HCC) 03/12/2017  . Social anxiety disorder 03/12/2017  . Snoring 03/12/2017  . At risk for obstructive sleep apnea 03/12/2017  . Tobacco use 03/12/2017  . Back pain 02/08/2014    Past Surgical History:  Procedure Laterality Date  . LEG SURGERY    . WRIST ARTHROSCOPY         Home Medications    Prior to Admission medications   Medication Sig Start Date End Date Taking? Authorizing Provider  albuterol (PROVENTIL HFA;VENTOLIN HFA) 108 (90 Base) MCG/ACT inhaler Inhale into the lungs every 6 (six) hours as needed for wheezing or shortness of breath.   Yes [provider]   amoxicillin-clavulanate (AUGMENTIN) 875-125 MG tablet Take 1 tablet by mouth 2 (two) times daily.   Yes [provider]  azithromycin (ZITHROMAX) 1 g powder Take 1 g by mouth once.   Yes [provider]  HYDROcodone-acetaminophen (NORCO/VICODIN) 5-325 MG tablet Take 1 tablet by mouth every 6 (six) hours as needed for moderate pain.   Yes [provider]  amphetamine-dextroamphetamine (ADDERALL) 20 MG tablet Take 1 tablet (20 mg total) by mouth every morning. 05/10/17   Carlis Stableummings, Charley Elizabeth, PA-C  benzonatate (TESSALON) 100 MG capsule Take 1-2 capsules (100-200 mg total) by mouth every 8 (eight) hours. 08/23/17   Lurene ShadowPhelps, Ridhi Hoffert O, PA-C  busPIRone (BUSPAR) 7.5 MG tablet Take 2 tablets (15 mg total) by mouth 2 (two) times daily. 05/10/17   Carlis Stableummings, Charley Elizabeth, PA-C  cyclobenzaprine (FLEXERIL) 5 MG tablet Take 1-2 tablets (5-10 mg total) by mouth 2 (two) times daily as needed for muscle spasms. 08/23/17   Lurene ShadowPhelps, Casey Maxfield O, PA-C  lisinopril-hydrochlorothiazide (PRINZIDE,ZESTORETIC) 20-25 MG tablet Take 1 tablet by mouth daily. 06/28/17   Carlis Stableummings, Charley Elizabeth, PA-C  metoprolol succinate (TOPROL-XL) 50 MG 24 hr tablet Take 1 tablet (50 mg total) daily by mouth. Take with or immediately following a meal. 07/12/17   Carlis Stableummings, Charley Elizabeth, PA-C  predniSONE (DELTASONE) 20 MG tablet 3 tabs po day one, then 2  po daily x 4 days 08/23/17   Lurene ShadowPhelps, Mesiah Manzo O, PA-C  sertraline (ZOLOFT) 50 MG tablet Take 1 tablet (50 mg total) by mouth daily. 05/10/17   Carlis Stableummings, Charley Elizabeth, PA-C    Family History Family History  Problem Relation Age of Onset  . Hypertension Mother   . Hyperlipidemia Mother   . Diabetes Father     Social History Social History   Tobacco Use  . Smoking status: Current Every Day Smoker    Packs/day: 1.00  . Smokeless tobacco: Never Used  Substance Use Topics  . Alcohol use: Yes  . Drug use: No     Allergies   Patient has no known  allergies.   Review of Systems Review of Systems  Constitutional: Negative for chills and fever.  HENT: Positive for congestion. Negative for ear pain, sore throat, trouble swallowing and voice change.   Respiratory: Positive for cough, chest tightness and wheezing. Negative for shortness of breath.   Cardiovascular: Positive for chest pain ( Left side). Negative for palpitations.  Gastrointestinal: Negative for abdominal pain, diarrhea, nausea and vomiting.  Musculoskeletal: Positive for arthralgias, back pain and myalgias.       Body aches  Skin: Negative for rash.     Physical Exam Triage Vital Signs ED Triage Vitals  Enc Vitals Group     BP 08/23/17 1004 125/87     Pulse Rate 08/23/17 1004 (!) 101     Resp --      Temp 08/23/17 1004 98.9 F (37.2 C)     Temp Source 08/23/17 1004 Oral     SpO2 08/23/17 1004 95 %     Weight 08/23/17 1005 250 lb (113.4 kg)     Height 08/23/17 1005 5\' 11"  (1.803 m)     Head Circumference --      Peak Flow --      Pain Score 08/23/17 1005 10     Pain Loc --      Pain Edu? --      Excl. in GC? --    No data found.  Updated Vital Signs BP 125/87 (BP Location: Right Arm)   Pulse (!) 101   Temp 98.9 F (37.2 C) (Oral)   Ht 5\' 11"  (1.803 m)   Wt 250 lb (113.4 kg)   SpO2 95%   BMI 34.87 kg/m   Visual Acuity Right Eye Distance:   Left Eye Distance:   Bilateral Distance:    Right Eye Near:   Left Eye Near:    Bilateral Near:     Physical Exam  Constitutional: He is oriented to person, place, and time. He appears well-developed and well-nourished.  Non-toxic appearance. He does not appear ill. No distress.  HENT:  Head: Normocephalic and atraumatic.  Eyes: EOM are normal.  Neck: Normal range of motion.  Cardiovascular: Normal rate and regular rhythm.  Pulmonary/Chest: Effort normal. No respiratory distress. He has no decreased breath sounds. He has wheezes. He has rhonchi. He exhibits tenderness.  Diffuse wheeze and rhonchi but  able to speak in full sentences.    Abdominal: Soft. He exhibits no distension. There is no tenderness.  Musculoskeletal: Normal range of motion.  Neurological: He is alert and oriented to person, place, and time.  Skin: Skin is warm and dry.  Psychiatric: He has a normal mood and affect. His behavior is normal.  Nursing note and vitals reviewed.    UC Treatments / Results  Labs (all labs ordered are listed, but only abnormal  results are displayed) Labs Reviewed - No data to display  EKG  EKG Interpretation None       Radiology No results found.  Procedures Procedures (including critical care time)  Medications Ordered in UC Medications  methylPREDNISolone sodium succinate (SOLU-MEDROL) 125 mg/2 mL injection 125 mg (125 mg Intramuscular Given 08/23/17 1045)     Initial Impression / Assessment and Plan / UC Course  I have reviewed the triage vital signs and the nursing notes.  Pertinent labs & imaging results that were available during my care of the patient were reviewed by me and considered in my medical decision making (see chart for details).     Pt was given a 3 day supply of Vicodin yesterday for left side chest pain along with appropriate antibiotics.  Will give Solumedrol 125mg  IM in UC to help with inflammation in lungs and also inflammation in chest wall muscles. Prednisone, tessalon, and flexeril prescribed today Encouraged to keep taking medication as prescribed yesterday and today. Call PCP Wednesday morning if not improving.  Discussed symptoms that warrant emergent care in the ED.    Final Clinical Impressions(s) / UC Diagnoses   Final diagnoses:  Acute bronchitis, unspecified organism  Wheeze  Left-sided chest wall pain    ED Discharge Orders        Ordered    predniSONE (DELTASONE) 20 MG tablet     08/23/17 1043    benzonatate (TESSALON) 100 MG capsule  Every 8 hours     08/23/17 1043    cyclobenzaprine (FLEXERIL) 5 MG tablet  2 times  daily PRN     08/23/17 1043       Controlled Substance Prescriptions [place] Controlled Substance Registry consulted? Pt was given 3 days supply of Vicodin yesterday. will hold off on additional narcotic pain medication as pt should be able to make it until Wednesday if taking medication as prescribed.  if in worsening pain or SOB, advised pt to go to emergency department.    Lurene Shadow, [place] 08/23/17 1242

## 2017-08-23 NOTE — Discharge Instructions (Signed)
°  Flexeril (cyclobenzaprine) is a muscle relaxer and may cause drowsiness. Do not drink alcohol, drive, or operate heavy machinery while taking.  You were given a shot of solumedrol (a steroid) today to help with inflammation in your lungs to help you breath better and to help with your cough.  You have been prescribed 5 days of prednisone, an oral steroid.  You may start this medication tomorrow with breakfast.    Do not take more of your pain medication, Vicodine (hydrocodone-acetaminophen) than prescribed. You may take 600-800mg  ibuprofen every 6-8 hours to help with pain and inflammation.  You may also alternate cool and warm compresses to help with pain.    Please take antibiotics as prescribed and be sure to complete entire course even if you start to feel better to ensure infection does not come back.

## 2017-08-23 NOTE — ED Triage Notes (Signed)
Pt has had a cough x 3 days, and 2 nights ago, he was coughing and felt a pop in left side.  [place] seen in [place] yesterday.  Was started on Augmentin, Norco, Ventolin, and z-pack.  Had an xray, but not sure of what that showed.

## 2017-08-26 ENCOUNTER — Telehealth: Payer: Self-pay

## 2017-08-26 NOTE — Telephone Encounter (Signed)
Left message to call UC if any questions or concerns.  Contact information given. 

## 2018-11-24 ENCOUNTER — Emergency Department (INDEPENDENT_AMBULATORY_CARE_PROVIDER_SITE_OTHER)
Admission: EM | Admit: 2018-11-24 | Discharge: 2018-11-24 | Disposition: A | Discharge status: Home / Self Care | Payer: BLUE CROSS/BLUE SHIELD | Source: Home / Self Care

## 2018-11-24 ENCOUNTER — Other Ambulatory Visit: Payer: Self-pay

## 2018-11-24 DIAGNOSIS — L02416 Cutaneous abscess of left lower limb: Secondary | ICD-10-CM | POA: Diagnosis not present

## 2018-11-24 DIAGNOSIS — I10 Essential (primary) hypertension: Secondary | ICD-10-CM

## 2018-11-24 MED ORDER — DOXYCYCLINE HYCLATE 100 MG PO CAPS: 100.0000 mg | ORAL_CAPSULE | Freq: Two times a day (BID) | ORAL | 0 refills | Status: DC

## 2018-11-24 MED ORDER — TRAMADOL HCL 50 MG PO TABS: 50.0000 mg | ORAL_TABLET | Freq: Four times a day (QID) | ORAL | 0 refills | Status: DC | PRN

## 2018-11-24 NOTE — ED Triage Notes (Signed)
Pt c/o cyst in groin area x 1-2 weeks. States its the size of a golf ball. Pain 6/10. Started taking an old antibiotic script from a previous infection.

## 2018-11-24 NOTE — ED Provider Notes (Signed)
[facility]    CSN: [id number] Arrival date & time: 11/24/18  1832     History   Chief Complaint Chief Complaint  Patient presents with   Cyst    HPI Luke Wilkerson is a 33 y.o. male.   HPI Luke Wilkerson is a 33 y.o. male presenting to UC with c/o 1 week of gradually worsening mass on his Left medial thigh that became painful this morning. Pain is 6/10, worse when the area rubs on his clothing. He notes it feels the size of a golf ball. Hx of abscess about 1.5 years ago in another location on his buttock that required I&D at that time.  He has started taking cephalexin this morning that he had from a prior infection. He states it is expired but he has about 10 tabs left.  Denies fever, chills, n/v/d.   BP elevated in triage. Hx of HTN but he does not currently have a PCP because he does not like going to the doctor. He is not currently taking any BP medication. Denise HA, chest pain, dizziness.   Past Medical History:  Diagnosis Date   ADHD    Anxiety    Elevated ALT measurement 05/11/2017   AST:ALT 0.5   Hypertension    Obesity     Patient Active Problem List   Diagnosis Date Noted   Perianal abscess 06/21/2017   NAFL (nonalcoholic fatty liver) 05/25/2017   Elevated ALT measurement 05/11/2017   RUQ abdominal pain 05/10/2017   Hypertension goal BP (blood pressure) < 130/80 04/09/2017   Alcohol consumption of more than two drinks per day 03/12/2017   Adult ADHD 03/12/2017   Elevated blood pressure reading 03/12/2017   Moderate episode of recurrent major depressive disorder (HCC) 03/12/2017   Social anxiety disorder 03/12/2017   Snoring 03/12/2017   At risk for obstructive sleep apnea 03/12/2017   Tobacco use 03/12/2017   Back pain 02/08/2014    Past Surgical History:  Procedure Laterality Date   LEG SURGERY     WRIST ARTHROSCOPY         Home Medications    Prior to Admission medications   Medication Sig Start Date End Date  Taking? Authorizing Provider  albuterol (PROVENTIL HFA;VENTOLIN HFA) 108 (90 Base) MCG/ACT inhaler Inhale into the lungs every 6 (six) hours as needed for wheezing or shortness of breath.    [provider]  amphetamine-dextroamphetamine (ADDERALL) 20 MG tablet Take 1 tablet (20 mg total) by mouth every morning. 05/10/17   Carlis Stable, PA-C  azithromycin [facility]) 1 g powder Take 1 g by mouth once.    [provider]  benzonatate (TESSALON) 100 MG capsule Take 1-2 capsules (100-200 mg total) by mouth every 8 (eight) hours. 08/23/17   Lurene Shadow, PA-C  busPIRone (BUSPAR) 7.5 MG tablet Take 2 tablets (15 mg total) by mouth 2 (two) times daily. 05/10/17   Carlis Stable, PA-C  cyclobenzaprine (FLEXERIL) 5 MG tablet Take 1-2 tablets (5-10 mg total) by mouth 2 (two) times daily as needed for muscle spasms. 08/23/17   Lurene Shadow, PA-C  doxycycline (VIBRAMYCIN) 100 MG capsule Take 1 capsule (100 mg total) by mouth 2 (two) times daily. One po bid x 7 days 11/24/18   Lurene Shadow, PA-C  HYDROcodone-acetaminophen (NORCO/VICODIN) 5-325 MG tablet Take 1 tablet by mouth every 6 (six) hours as needed for moderate pain.    [provider]  lisinopril-hydrochlorothiazide (PRINZIDE,ZESTORETIC) 20-25 MG tablet Take 1 tablet by mouth  daily. 06/28/17   Carlis Stable, PA-C  metoprolol succinate (TOPROL-XL) 50 MG 24 hr tablet Take 1 tablet (50 mg total) daily by mouth. Take with or immediately following a meal. 07/12/17   Carlis Stable, PA-C  predniSONE (DELTASONE) 20 MG tablet 3 tabs po day one, then 2 po daily x 4 days 08/23/17   Lurene Shadow, PA-C  sertraline (ZOLOFT) 50 MG tablet Take 1 tablet (50 mg total) by mouth daily. 05/10/17   Carlis Stable, PA-C  traMADol (ULTRAM) 50 MG tablet Take 1 tablet (50 mg total) by mouth every 6 (six) hours as needed. 11/24/18   Lurene Shadow, PA-C    Family History Family  History  Problem Relation Age of Onset   Hypertension Mother    Hyperlipidemia Mother    Diabetes Father     Social History Social History   Tobacco Use   Smoking status: Current Every Day Smoker    Packs/day: 1.00   Smokeless tobacco: Never Used  Substance Use Topics   Alcohol use: Yes   Drug use: No     Allergies   Patient has no known allergies.   Review of Systems Review of Systems  Constitutional: Negative for chills and fever.  Skin: Positive for color change. Negative for wound.     Physical Exam Triage Vital Signs ED Triage Vitals  Enc Vitals Group     BP 11/24/18 1853 (!) 153/101     Pulse Rate 11/24/18 1853 86     Resp 11/24/18 1853 18     Temp 11/24/18 1853 98.5 F (36.9 C)     Temp Source 11/24/18 1853 Oral     SpO2 11/24/18 1853 99 %     Weight --      Height --      Head Circumference --      Peak Flow --      Pain Score 11/24/18 1854 6     Pain Loc --      Pain Edu? --      Excl. in GC? --    No data found.  Updated Vital Signs BP (!) 162/103    Pulse 86    Temp 98.5 F (36.9 C) (Oral)    Resp 18    SpO2 99%   Visual Acuity Right Eye Distance:   Left Eye Distance:   Bilateral Distance:    Right Eye Near:   Left Eye Near:    Bilateral Near:     Physical Exam Vitals signs and nursing note reviewed. Exam conducted with a chaperone present.  Constitutional:      Appearance: Normal appearance. He is well-developed.  HENT:     Head: Normocephalic and atraumatic.  Neck:     Musculoskeletal: Normal range of motion.  Cardiovascular:     Rate and Rhythm: Normal rate.  Pulmonary:     Effort: Pulmonary effort is normal.  Genitourinary:      Comments: Left medial thigh: 2cm erythematous indurated tender mass with centralized fluctuance. 2-3cm surrounding erythema w/o induration or tenderness.  Musculoskeletal: Normal range of motion.  Skin:    General: Skin is warm and dry.  Neurological:     Mental Status: He is alert and  oriented to person, place, and time.  Psychiatric:        Behavior: Behavior normal.      UC Treatments / Results  Labs (all labs ordered are listed, but only abnormal results are displayed) Labs Reviewed  WOUND  CULTURE    EKG None  Radiology No results found.  Procedures Incision and Drainage Date/Time: 11/24/2018 7:17 PM Performed by: Lurene Shadow, PA-C Authorized by: Lurene Shadow, PA-C   Consent:    Consent obtained:  Verbal   Consent given by:  Patient   Risks discussed:  Bleeding, incomplete drainage and pain   Alternatives discussed:  No treatment and delayed treatment Location:    Type:  Abscess   Size:  2   Location:  Lower extremity   Lower extremity location:  Leg   Leg location:  L upper leg Pre-procedure details:    Skin preparation:  Betadine Anesthesia (see MAR for exact dosages):    Anesthesia method:  Local infiltration   Local anesthetic:  Lidocaine 2% WITH epi Procedure type:    Complexity:  Simple Procedure details:    Needle aspiration: no     Incision types:  Single straight   Incision depth:  Subcutaneous   Scalpel blade:  11   Wound management:  Probed and deloculated   Drainage:  Purulent and bloody   Drainage amount:  Moderate   Wound treatment:  Wound left open   Packing materials:  None Post-procedure details:    Patient tolerance of procedure:  Tolerated well, no immediate complications   (including critical care time)  Medications Ordered in UC Medications - No data to display  Initial Impression / Assessment and Plan / UC Course  I have reviewed the triage vital signs and the nursing notes.  Pertinent labs & imaging results that were available during my care of the patient were reviewed by me and considered in my medical decision making (see chart for details).     Hx and exam c/w abscess and surrounding cellulitis I&D performed as noted above Wound culture sent Pt may continue the cephalexin, however, will add  doxycycline Home care info provided  BP elevated, stressed importance of f/u with PCP for his BP  Final Clinical Impressions(s) / UC Diagnoses   Final diagnoses:  Abscess of left thigh  Uncontrolled hypertension     Discharge Instructions      Please take your antibiotics as prescribed. You may continue to take the cephalexin one tablet twice daily for 5 days in addition to the newly prescribed doxycycline. Please take antibiotics as prescribed and be sure to complete entire course even if you start to feel better to ensure infection does not come back.  Tramadol is strong pain medication. While taking, do not drink alcohol, drive, or perform any other activities that requires focus while taking these medications.   You may take 500mg  acetaminophen every 4-6 hours or in combination with ibuprofen 400-600mg  every 6-8 hours as needed for pain and inflammation.  Please return for a wound check in 2-3 days if not improving, sooner if significantly worsening.  It is also very important to reestablish care with a primary care provider for ongoing healthcare needs including monitoring and maintenance of your blood pressure.     ED Prescriptions    Medication Sig Dispense Auth. Provider   doxycycline (VIBRAMYCIN) 100 MG capsule Take 1 capsule (100 mg total) by mouth 2 (two) times daily. One po bid x 7 days 14 capsule Stephanee Barcomb O, PA-C   traMADol (ULTRAM) 50 MG tablet Take 1 tablet (50 mg total) by mouth every 6 (six) hours as needed. 10 tablet Lurene Shadow, PA-C     Controlled Substance Prescriptions [place] Controlled Substance Registry consulted? Yes, I have consulted  the [place] Controlled Substances Registry for this patient, and feel the risk/benefit ratio today is favorable for proceeding with this prescription for a controlled substance.   Lurene Shadow, [place] 11/24/18 1921

## 2018-11-24 NOTE — Discharge Instructions (Signed)
°  Please take your antibiotics as prescribed. You may continue to take the cephalexin one tablet twice daily for 5 days in addition to the newly prescribed doxycycline. Please take antibiotics as prescribed and be sure to complete entire course even if you start to feel better to ensure infection does not come back.  Tramadol is strong pain medication. While taking, do not drink alcohol, drive, or perform any other activities that requires focus while taking these medications.   You may take 500mg  acetaminophen every 4-6 hours or in combination with ibuprofen 400-600mg  every 6-8 hours as needed for pain and inflammation.  Please return for a wound check in 2-3 days if not improving, sooner if significantly worsening.  It is also very important to reestablish care with a primary care provider for ongoing healthcare needs including monitoring and maintenance of your blood pressure.

## 2018-11-27 ENCOUNTER — Telehealth: Payer: Self-pay | Admitting: Emergency Medicine

## 2018-11-27 LAB — WOUND CULTURE
MICRO NUMBER:: 355687
RESULT:: NO GROWTH
SPECIMEN QUALITY:: ADEQUATE

## 2018-11-27 NOTE — Telephone Encounter (Signed)
Spoke with patient, doing better.  Lab results given.  Will follow up as needed.

## 2019-04-06 ENCOUNTER — Telehealth: Payer: Self-pay | Admitting: General Practice

## 2019-04-07 ENCOUNTER — Encounter: Payer: Self-pay | Admitting: Physician Assistant

## 2019-04-07 ENCOUNTER — Other Ambulatory Visit: Payer: Self-pay

## 2019-04-07 ENCOUNTER — Ambulatory Visit (INDEPENDENT_AMBULATORY_CARE_PROVIDER_SITE_OTHER): Payer: BC Managed Care – PPO | Admitting: Physician Assistant

## 2019-04-07 VITALS — BP 150/97 | HR 91 | Temp 98.3°F | Wt 240.0 lb

## 2019-04-07 DIAGNOSIS — N5089 Other specified disorders of the male genital organs: Secondary | ICD-10-CM

## 2019-04-07 DIAGNOSIS — I10 Essential (primary) hypertension: Secondary | ICD-10-CM | POA: Diagnosis not present

## 2019-04-07 DIAGNOSIS — Z13220 Encounter for screening for lipoid disorders: Secondary | ICD-10-CM | POA: Diagnosis not present

## 2019-04-07 DIAGNOSIS — K760 Fatty (change of) liver, not elsewhere classified: Secondary | ICD-10-CM

## 2019-04-07 MED ORDER — AMLODIPINE BESYLATE 10 MG PO TABS: 10.0000 mg | ORAL_TABLET | Freq: Every day | ORAL | 0 refills | Status: DC

## 2019-04-07 NOTE — Telephone Encounter (Signed)
error 

## 2019-04-07 NOTE — Progress Notes (Signed)
HPI:                                                                Luke Wilkerson is a 33 y.o. male who presents to [facility] today for hypertension and rectal bleeding  Patient has been lost to follow-up since 07/2017.   He states for the last 2 years he has had stinging pain and intermittent bleeding from his gluteal area that is worse activity. He states pain is in the same location of his prior I&D of a perineal abscess in 2018. CT Abdomen 05/2017 Fat stranding identified within the medial aspect of the left gluteal region along the left posterior lateral aspect of the anus with more  focal fluid collection centrally. This area measures approximately 2.8 x 2.6 x 1.7 cm. No visualized intrapelvic extension.  HTN: self-discontinued his medications and was lost to follow-up. Denies vision change, headache, chest pain with exertion, orthopnea, lightheadedness, syncope and edema.  Risk factors include: male sex, tobacco use, obesity  Adult ADHD: he would like to re-start Adderall.   Past Medical History:  Diagnosis Date  . ADHD   . Anxiety   . Elevated ALT measurement 05/11/2017   AST:ALT 0.5  . Hypertension   . Obesity    Past Surgical History:  Procedure Laterality Date  . LEG SURGERY    . WRIST ARTHROSCOPY     Social History   Tobacco Use  . Smoking status: Current Every Day Smoker    Packs/day: 1.00  . Smokeless tobacco: Never Used  Substance Use Topics  . Alcohol use: Yes   family history includes Diabetes in his father; Hyperlipidemia in his mother; Hypertension in his mother.    ROS: negative except as noted in the HPI  Medications: Current Outpatient Medications  Medication Sig Dispense Refill  . amLODipine (NORVASC) 10 MG tablet Take 1 tablet (10 mg total) by mouth daily. 30 tablet 0   No current facility-administered medications for this visit.    No Known Allergies     Objective:  BP (!) 152/96    Pulse 91   Temp 98.3 F (36.8 C) (Oral)   Wt 240 lb (108.9 kg)   BMI 33.47 kg/m   Vitals:   04/07/19 1542 04/07/19 1609  BP: (!) 162/100 (!) 152/96  Pulse: 91   Temp: 98.3 F (36.8 C)    Wt Readings from Last 3 Encounters:  04/07/19 240 lb (108.9 kg)  08/23/17 250 lb (113.4 kg)  06/28/17 249 lb (112.9 kg)   Temp Readings from Last 3 Encounters:  04/07/19 98.3 F (36.8 C) (Oral)  11/24/18 98.5 F (36.9 C) (Oral)  08/23/17 98.9 F (37.2 C) (Oral)   BP Readings from Last 3 Encounters:  04/07/19 (!) 152/96  11/24/18 (!) 162/103  08/23/17 125/87   Pulse Readings from Last 3 Encounters:  04/07/19 91  11/24/18 86  08/23/17 (!) 101    Gen:  alert, not ill-appearing, no distress, appropriate for age HEENT: head normocephalic without obvious abnormality, conjunctiva and cornea clear, trachea midline Pulm: Normal work of breathing, normal phonation, clear to auscultation bilaterally, no wheezes, rales or rhonchi CV: Normal rate, regular rhythm, s1 and s2 distinct, no murmurs, clicks or rubs  Neuro: alert and  oriented x 3, no tremor MSK: extremities atraumatic, normal gait and station, no peripheral edema Skin: left medial buttock/perineum there is approx 0.5 cm cystic nodule that is tender, non-friable without fluctuance, induration or redness Psych: well-groomed, cooperative, good eye contact, euthymic mood, affect mood-congruent, speech is articulate, and thought processes clear and goal-directed  Acute Interface, Incoming Rad Results - 06/19/2017 12:51 PM EDT TECHNIQUE: Following the intravenous administration of iodinated contrast, multiphase axial imaging through the abdomen and pelvis performed.  Radiation dose reduction was utilized (automated exposure control, mA or kV adjustment based on patient size,  or iterative image reconstruction).   Contrast: 80cc Iso-vue 370  COMPARISON:  None  INDICATION: rectal abscess  FINDINGS: CHEST: Mediastinum: Unremarkable.   Heart/vessels: Normal cardiac size.   Lungs/Pleura: Unremarkable.  Abdomen: Spleen: Normal Adrenals: Normal Pancreas: Normal Liver: Diffuse low-attenuation. 17.9 cm parapelvic dimensions. Gallbladder: Normal Biliary: Normal Kidneys and ureters: Unremarkable. Stomach, small bowel, and large bowel: No wall thickening, mass, dilatation, or obstruction.  Normal appendix. Fat stranding identified within the medial aspect of the left gluteal region along the left posterior lateral aspect of the anus with more  focal fluid collection centrally. This area measures approximately 2.8 x 2.6 x 1.7 cm. No visualized intrapelvic extension. Peritoneum/extraperitoneum: No free fluid or free air. No pathologically enlarged lymph nodes. PELVIS: Bladder: Unremarkable. Reproductive: Unremarkable.  VESSELS: Unremarkable.  MUSCULOSKELETAL: No aggressive lesions or fractures. Mild degenerative changes L5/S1.   Additional comments:None   IMPRESSION: Small perianal abscess.  Hepatic steatosis.  Electronically Signed by: Levy Sjogren   Assessment and Plan: 33 y.o. male with   .[name] was seen today for hypertension.  Diagnoses and all orders for this visit:  Perineal cyst in male -     Ambulatory referral to General Surgery  Uncontrolled stage 2 hypertension -     COMPLETE METABOLIC PANEL WITH GFR -     TSH + free T4 -     Urinalysis, Routine w reflex microscopic -     amLODipine (NORVASC) 10 MG tablet; Take 1 tablet (10 mg total) by mouth daily.  NAFL (nonalcoholic fatty liver) -     COMPLETE METABOLIC PANEL WITH GFR -     Lipid Profile  Screening for lipid disorders -     Lipid Profile  Hypertension goal BP (blood pressure) < 130/80   HTN BP Readings from Last 3 Encounters:  04/07/19 (!) 150/97  11/24/18 (!) 162/103  08/23/17 125/87  Severe, uncontrolled stage 2 HTN, asymptomatic CMP, TSH, UA pending Start Amlodipine 10 mg QD Counseled patient on therapeutic lifestyle  changes and home monitoring Declines smoking cessation  Perineal cyst Hx of perineal abscess with persistent non-healing cyst of the left medial buttock. No evidence of secondary infection/abscess on exam today Referral to Gen Surg to discuss excision  Adult ADHD Discussed that we cannot start stimulants until BP is controlled  Patient education and anticipatory guidance given Patient agrees with treatment plan Follow-up in 2 weeks for HTN/ADHD or sooner as needed if symptoms worsen or fail to improve  Darlyne Russian PA-C

## 2019-04-07 NOTE — Patient Instructions (Signed)
For your blood pressure: - Goal <130/80 (Ideally 120's/70's) - take your blood pressure medication in the morning (unless instructed differently) - monitor and log blood pressures at home - check around the same time each day in a relaxed setting - Limit salt to <2500 mg/day - Follow DASH (Dietary Approach to Stopping Hypertension) eating plan - Try to get at least 150 minutes of aerobic exercise per week - Aim to go on a brisk walk 30 minutes per day at least 5 days per week. If you're not active, gradually increase how long you walk by 5 minutes each week - limit alcohol: 2 standard drinks per day for men and 1 per day for women - avoid tobacco/nicotine products. Consider smoking cessation if you smoke - weight loss: 7% of current body weight can reduce your blood pressure by 5-10 points - follow-up at least every 6 months for your blood pressure. Follow-up sooner if your BP is not controlled    Hypertension, Adult Hypertension is another name for high blood pressure. High blood pressure forces your heart to work harder to pump blood. This can cause problems over time. There are two numbers in a blood pressure reading. There is a top number (systolic) over a bottom number (diastolic). It is best to have a blood pressure that is below 120/80. Healthy choices can help lower your blood pressure, or you may need medicine to help lower it. What are the causes? The cause of this condition is not known. Some conditions may be related to high blood pressure. What increases the risk?  Smoking.  Having type 2 diabetes mellitus, high cholesterol, or both.  Not getting enough exercise or physical activity.  Being overweight.  Having too much fat, sugar, calories, or salt (sodium) in your diet.  Drinking too much alcohol.  Having long-term (chronic) kidney disease.  Having a family history of high blood pressure.  Age. Risk increases with age.  Race. You may be at higher risk if you are  African American.  Gender. Men are at higher risk than women before age 90. After age 51, women are at higher risk than men.  Having obstructive sleep apnea.  Stress. What are the signs or symptoms?  High blood pressure may not cause symptoms. Very high blood pressure (hypertensive crisis) may cause: ? Headache. ? Feelings of worry or nervousness (anxiety). ? Shortness of breath. ? Nosebleed. ? A feeling of being sick to your stomach (nausea). ? Throwing up (vomiting). ? Changes in how you see. ? Very bad chest pain. ? Seizures. How is this treated?  This condition is treated by making healthy lifestyle changes, such as: ? Eating healthy foods. ? Exercising more. ? Drinking less alcohol.  Your health care provider may prescribe medicine if lifestyle changes are not enough to get your blood pressure under control, and if: ? Your top number is above 130. ? Your bottom number is above 80.  Your personal target blood pressure may vary. Follow these instructions at home: Eating and drinking   If told, follow the DASH eating plan. To follow this plan: ? Fill one half of your plate at each meal with fruits and vegetables. ? Fill one fourth of your plate at each meal with whole grains. Whole grains include whole-wheat pasta, brown rice, and whole-grain bread. ? Eat or drink low-fat dairy products, such as skim milk or low-fat yogurt. ? Fill one fourth of your plate at each meal with low-fat (lean) proteins. Low-fat proteins include fish,  chicken without skin, eggs, beans, and tofu. ? Avoid fatty meat, cured and processed meat, or chicken with skin. ? Avoid pre-made or processed food.  Eat less than 1,500 mg of salt each day.  Do not drink alcohol if: ? Your doctor tells you not to drink. ? You are pregnant, may be pregnant, or are planning to become pregnant.  If you drink alcohol: ? Limit how much you use to:  0-1 drink a day for women.  0-2 drinks a day for men. ? Be  aware of how much alcohol is in your drink. In the [place] one drink equals one 12 oz bottle of beer (355 mL), one 5 oz glass of wine (148 mL), or one 1 oz glass of hard liquor (44 mL). Lifestyle   Work with your doctor to stay at a healthy weight or to lose weight. Ask your doctor what the best weight is for you.  Get at least 30 minutes of exercise most days of the week. This may include walking, swimming, or biking.  Get at least 30 minutes of exercise that strengthens your muscles (resistance exercise) at least 3 days a week. This may include lifting weights or doing Pilates.  Do not use any products that contain nicotine or tobacco, such as cigarettes, e-cigarettes, and chewing tobacco. If you need help quitting, ask your doctor.  Check your blood pressure at home as told by your doctor.  Keep all follow-up visits as told by your doctor. This is important. Medicines  Take over-the-counter and prescription medicines only as told by your doctor. Follow directions carefully.  Do not skip doses of blood pressure medicine. The medicine does not work as well if you skip doses. Skipping doses also puts you at risk for problems.  Ask your doctor about side effects or reactions to medicines that you should watch for. Contact a doctor if you:  Think you are having a reaction to the medicine you are taking.  Have headaches that keep coming back (recurring).  Feel dizzy.  Have swelling in your ankles.  Have trouble with your vision. Get help right away if you:  Get a very bad headache.  Start to feel mixed up (confused).  Feel weak or numb.  Feel faint.  Have very bad pain in your: ? Chest. ? Belly (abdomen).  Throw up more than once.  Have trouble breathing. Summary  Hypertension is another name for high blood pressure.  High blood pressure forces your heart to work harder to pump blood.  For most people, a normal blood pressure is less than 120/80.  Making healthy  choices can help lower blood pressure. If your blood pressure does not get lower with healthy choices, you may need to take medicine. This information is not intended to replace advice given to you by your health care provider. Make sure you discuss any questions you have with your health care provider. Document Released: 02/03/2008 Document Revised: 04/27/2018 Document Reviewed: 04/27/2018 Elsevier Patient Education  2020 [employer]   DASH Eating Plan DASH stands for "Dietary Approaches to Stop Hypertension." The DASH eating plan is a healthy eating plan that has been shown to reduce high blood pressure (hypertension). It may also reduce your risk for type 2 diabetes, heart disease, and stroke. The DASH eating plan may also help with weight loss. What are tips for following this plan?  General guidelines  Avoid eating more than 2,300 mg (milligrams) of salt (sodium) a day. If you have hypertension, you  may need to reduce your sodium intake to 1,500 mg a day.  Limit alcohol intake to no more than 1 drink a day for nonpregnant women and 2 drinks a day for men. One drink equals 12 oz of beer, 5 oz of wine, or 1 oz of hard liquor.  Work with your health care provider to maintain a healthy body weight or to lose weight. Ask what an ideal weight is for you.  Get at least 30 minutes of exercise that causes your heart to beat faster (aerobic exercise) most days of the week. Activities may include walking, swimming, or biking.  Work with your health care provider or diet and nutrition specialist (dietitian) to adjust your eating plan to your individual calorie needs. Reading food labels   Check food labels for the amount of sodium per serving. Choose foods with less than 5 percent of the Daily Value of sodium. Generally, foods with less than 300 mg of sodium per serving fit into this eating plan.  To find whole grains, look for the word "whole" as the first word in the ingredient list.  Shopping  Buy products labeled as "low-sodium" or "no salt added."  Buy fresh foods. Avoid canned foods and premade or frozen meals. Cooking  Avoid adding salt when cooking. Use salt-free seasonings or herbs instead of table salt or sea salt. Check with your health care provider or pharmacist before using salt substitutes.  Do not fry foods. Cook foods using healthy methods such as baking, boiling, grilling, and broiling instead.  Cook with heart-healthy oils, such as olive, canola, soybean, or sunflower oil. Meal planning  Eat a balanced diet that includes: ? 5 or more servings of fruits and vegetables each day. At each meal, try to fill half of your plate with fruits and vegetables. ? Up to 6-8 servings of whole grains each day. ? Less than 6 oz of lean meat, poultry, or fish each day. A 3-oz serving of meat is about the same size as a deck of cards. One egg equals 1 oz. ? 2 servings of low-fat dairy each day. ? A serving of nuts, seeds, or beans 5 times each week. ? Heart-healthy fats. Healthy fats called Omega-3 fatty acids are found in foods such as flaxseeds and coldwater fish, like sardines, salmon, and mackerel.  Limit how much you eat of the following: ? Canned or prepackaged foods. ? Food that is high in trans fat, such as fried foods. ? Food that is high in saturated fat, such as fatty meat. ? Sweets, desserts, sugary drinks, and other foods with added sugar. ? Full-fat dairy products.  Do not salt foods before eating.  Try to eat at least 2 vegetarian meals each week.  Eat more home-cooked food and less restaurant, buffet, and fast food.  When eating at a restaurant, ask that your food be prepared with less salt or no salt, if possible. What foods are recommended? The items listed may not be a complete list. Talk with your dietitian about what dietary choices are best for you. Grains Whole-grain or whole-wheat bread. Whole-grain or whole-wheat pasta. Brown rice.  Orpah Cobbatmeal. Quinoa. Bulgur. Whole-grain and low-sodium cereals. Pita bread. Low-fat, low-sodium crackers. Whole-wheat flour tortillas. Vegetables Fresh or frozen vegetables (raw, steamed, roasted, or grilled). Low-sodium or reduced-sodium tomato and vegetable juice. Low-sodium or reduced-sodium tomato sauce and tomato paste. Low-sodium or reduced-sodium canned vegetables. Fruits All fresh, dried, or frozen fruit. Canned fruit in natural juice (without added sugar). Meat and other  protein foods Skinless chicken or [place]key. Ground chicken or [place]key. Pork with fat trimmed off. Fish and seafood. Egg whites. Dried beans, peas, or lentils. Unsalted nuts, nut butters, and seeds. Unsalted canned beans. Lean cuts of beef with fat trimmed off. Low-sodium, lean deli meat. Dairy Low-fat (1%) or fat-free (skim) milk. Fat-free, low-fat, or reduced-fat cheeses. Nonfat, low-sodium ricotta or cottage cheese. Low-fat or nonfat yogurt. Low-fat, low-sodium cheese. Fats and oils Soft margarine without trans fats. Vegetable oil. Low-fat, reduced-fat, or light mayonnaise and salad dressings (reduced-sodium). Canola, safflower, olive, soybean, and sunflower oils. Avocado. Seasoning and other foods Herbs. Spices. Seasoning mixes without salt. Unsalted popcorn and pretzels. Fat-free sweets. What foods are not recommended? The items listed may not be a complete list. Talk with your dietitian about what dietary choices are best for you. Grains Baked goods made with fat, such as croissants, muffins, or some breads. Dry pasta or rice meal packs. Vegetables Creamed or fried vegetables. Vegetables in a cheese sauce. Regular canned vegetables (not low-sodium or reduced-sodium). Regular canned tomato sauce and paste (not low-sodium or reduced-sodium). Regular tomato and vegetable juice (not low-sodium or reduced-sodium). Rosita FirePickles. Olives. Fruits Canned fruit in a light or heavy syrup. Fried fruit. Fruit in cream or butter sauce. Meat  and other protein foods Fatty cuts of meat. Ribs. Fried meat. Tomasa BlaseBacon. Sausage. Bologna and other processed lunch meats. Salami. Fatback. Hotdogs. Bratwurst. Salted nuts and seeds. Canned beans with added salt. Canned or smoked fish. Whole eggs or egg yolks. Chicken or [place]key with skin. Dairy Whole or 2% milk, cream, and half-and-half. Whole or full-fat cream cheese. Whole-fat or sweetened yogurt. Full-fat cheese. Nondairy creamers. Whipped toppings. Processed cheese and cheese spreads. Fats and oils Butter. Stick margarine. Lard. Shortening. Ghee. Bacon fat. Tropical oils, such as coconut, palm kernel, or palm oil. Seasoning and other foods Salted popcorn and pretzels. Onion salt, garlic salt, seasoned salt, table salt, and sea salt. Worcestershire sauce. Tartar sauce. Barbecue sauce. Teriyaki sauce. Soy sauce, including reduced-sodium. Steak sauce. Canned and packaged gravies. Fish sauce. Oyster sauce. Cocktail sauce. Horseradish that you find on the shelf. Ketchup. Mustard. Meat flavorings and tenderizers. Bouillon cubes. Hot sauce and Tabasco sauce. Premade or packaged marinades. Premade or packaged taco seasonings. Relishes. Regular salad dressings. Where to find more information:  National Heart, Lung, and Blood Institute: PopSteam.iswww.nhlbi.nih.gov  American Heart Association: www.heart.org Summary  The DASH eating plan is a healthy eating plan that has been shown to reduce high blood pressure (hypertension). It may also reduce your risk for type 2 diabetes, heart disease, and stroke.  With the DASH eating plan, you should limit salt (sodium) intake to 2,300 mg a day. If you have hypertension, you may need to reduce your sodium intake to 1,500 mg a day.  When on the DASH eating plan, aim to eat more fresh fruits and vegetables, whole grains, lean proteins, low-fat dairy, and heart-healthy fats.  Work with your health care provider or diet and nutrition specialist (dietitian) to adjust your eating  plan to your individual calorie needs. This information is not intended to replace advice given to you by your health care provider. Make sure you discuss any questions you have with your health care provider. Document Released: 08/06/2011 Document Revised: 07/30/2017 Document Reviewed: 08/10/2016 Elsevier Patient Education  2020 [employer]

## 2019-04-08 LAB — URINALYSIS, ROUTINE W REFLEX MICROSCOPIC
Bilirubin Urine: NEGATIVE
Glucose, UA: NEGATIVE
Hgb urine dipstick: NEGATIVE
Ketones, ur: NEGATIVE
Leukocytes,Ua: NEGATIVE
Nitrite: NEGATIVE
Protein, ur: NEGATIVE
Specific Gravity, Urine: 1.025 (ref 1.001–1.03)
pH: <=5 (ref 5.0–8.0)

## 2019-04-08 LAB — COMPLETE METABOLIC PANEL WITH GFR
AG Ratio: 2.3 (calc) (ref 1.0–2.5)
ALT: 56 U/L — ABNORMAL HIGH (ref 9–46)
AST: 34 U/L (ref 10–40)
Albumin: 5 g/dL (ref 3.6–5.1)
Alkaline phosphatase (APISO): 74 U/L (ref 36–130)
BUN: 13 mg/dL (ref 7–25)
CO2: 26 mmol/L (ref 20–32)
Calcium: 10 mg/dL (ref 8.6–10.3)
Chloride: 104 mmol/L (ref 98–110)
Creat: 0.99 mg/dL (ref 0.60–1.35)
GFR, Est African American: 116 mL/min/{1.73_m2} (ref >=60)
GFR, Est Non African American: 100 mL/min/{1.73_m2} (ref >=60)
Globulin: 2.2 g/dL (calc) (ref 1.9–3.7)
Glucose, Bld: 86 mg/dL (ref 65–99)
Potassium: 4.1 mmol/L (ref 3.5–5.3)
Sodium: 139 mmol/L (ref 135–146)
Total Bilirubin: 0.9 mg/dL (ref 0.2–1.2)
Total Protein: 7.2 g/dL (ref 6.1–8.1)

## 2019-04-08 LAB — LIPID PANEL
Cholesterol: 184 mg/dL (ref <200)
HDL: 38 mg/dL — ABNORMAL LOW (ref >=40)
LDL Cholesterol (Calc): 106 mg/dL (calc) — ABNORMAL HIGH
Non-HDL Cholesterol (Calc): 146 mg/dL (calc) — ABNORMAL HIGH (ref <130)
Total CHOL/HDL Ratio: 4.8 (calc) (ref <5.0)
Triglycerides: 280 mg/dL — ABNORMAL HIGH (ref <150)

## 2019-04-08 LAB — TSH+FREE T4: TSH W/REFLEX TO FT4: 1.29 mIU/L (ref 0.40–4.50)

## 2019-04-10 ENCOUNTER — Encounter: Payer: Self-pay | Admitting: Physician Assistant

## 2019-04-10 DIAGNOSIS — E781 Pure hyperglyceridemia: Secondary | ICD-10-CM | POA: Insufficient documentation

## 2019-04-10 HISTORY — DX: Pure hyperglyceridemia: E78.1

## 2019-04-24 ENCOUNTER — Ambulatory Visit (INDEPENDENT_AMBULATORY_CARE_PROVIDER_SITE_OTHER): Payer: BC Managed Care – PPO | Admitting: Physician Assistant

## 2019-04-24 ENCOUNTER — Ambulatory Visit: Payer: Self-pay | Admitting: Surgery

## 2019-04-24 ENCOUNTER — Encounter: Payer: Self-pay | Admitting: Physician Assistant

## 2019-04-24 ENCOUNTER — Other Ambulatory Visit: Payer: Self-pay

## 2019-04-24 VITALS — BP 138/81 | HR 75 | Temp 98.0°F | Wt 238.0 lb

## 2019-04-24 DIAGNOSIS — K603 Anal fistula: Secondary | ICD-10-CM | POA: Diagnosis not present

## 2019-04-24 DIAGNOSIS — F909 Attention-deficit hyperactivity disorder, unspecified type: Secondary | ICD-10-CM

## 2019-04-24 DIAGNOSIS — I10 Essential (primary) hypertension: Secondary | ICD-10-CM | POA: Diagnosis not present

## 2019-04-24 DIAGNOSIS — F4010 Social phobia, unspecified: Secondary | ICD-10-CM

## 2019-04-24 DIAGNOSIS — Z720 Tobacco use: Secondary | ICD-10-CM | POA: Diagnosis not present

## 2019-04-24 MED ORDER — AMLODIPINE BESYLATE 10 MG PO TABS: 10.0000 mg | ORAL_TABLET | Freq: Every day | ORAL | 0 refills | Status: DC

## 2019-04-24 MED ORDER — AMPHETAMINE-DEXTROAMPHET ER 10 MG PO CP24: 10.0000 mg | ORAL_CAPSULE | Freq: Every day | ORAL | 0 refills | Status: DC

## 2019-04-24 NOTE — Progress Notes (Signed)
HPI:                                                                Luke Wilkerson is a 33 y.o. male who presents to [facility]: [facility] today for HTN follow-up  HTN: diagnosed in 2018. He had self-discontinued his medications and was lost to follow-up. He was started on Amlodipine 10 mg on 8/07 and has been monitoring BP at home Home BP readings 8/08 - 8/15 161/82 147/92 147/74 141/84 160/75 8/16 - 8/24 125/62 111/69 125/71 120/68 129/71 137/82 135/68 143/79 Denies vision change, headache, chest pain with exertion, orthopnea, lightheadedness, syncope and edema.  Risk factors include: male sex, tobacco use, obesity  ADHD: reports longstanding history since kindergarten. He was treated with Adderall in high school and again in August 2018. Reports medication helped him to engage in social activities. Reports he is having a hard time concentrating while working from home, and is easily distracted and has difficulty finishing a single task. He feels frustrated  Anxiety: he was treated for social anxiety with Lexapro and Buspar in 2018. He states he took the medication for several months and did not notice any difference in symptoms.    Adult ADHD Self Report Scale (most recent)    Adult ADHD Self-Report Scale (ASRS-v1.1) Symptom Checklist - 04/24/19 1134      Part A   1. How often do you have trouble wrapping up the final details of a project, once the challenging parts have been done?  (!) Sometimes  2. How often do you have difficulty getting things done in order when you have to do a task that requires organization?  (!) Often    3. How often do you have problems remembering appointments or obligations?  (!) Very Often  4. When you have a task that requires a lot of thought, how often do you avoid or delay getting started?  Sometimes    5. How often do you fidget or squirm with your hands or feet when you have to sit down for a long time?   Sometimes  6. How often do you feel overly active and compelled to do things, like you were driven by a motor?  Sometimes      Part B   7. How often do you make careless mistakes when you have to work on a boring or difficult project?  (!) Very Often  8. How often do you have difficulty keeping your attention when you are doing boring or repetitive work?  (!) Very Often    9. How often do you have difficulty concentrating on what people say to you, even when they are speaking to you directly?  (!) Often  10. How often do you misplace or have difficulty finding things at home or at work?  (!) Very Often    11. How often are you distracted by activity or noise around you?  (!) Very Often  12. How often do you leave your seat in meetings or other situations in which you are expected to remain seated?  Rarely    13. How often do you feel restless or fidgety?  Sometimes  14. How often do you have difficulty unwinding and relaxing when you have time  to yourself?  (!) Often    15. How often do you find yourself talking too much when you are in social situations?  Never  16. When you are in a conversation, how often do you find yourself finishing the sentences of the people you are talking to, before they can finish them themselves?  (!) Often    17. How often do you have difficulty waiting your turn in situations when turn taking is required?  (!) Often  18. How often do you interrupt others when they are busy?  (!) Often      Comment   How old were you when these problems first began to occur?  6         Depression screen [facility] 2/9 04/24/2019 05/10/2017 04/09/2017 03/12/2017  Decreased Interest 0 3 3 3   Down, Depressed, Hopeless 0 2 1 1   PHQ - 2 Score 0 5 4 4   Altered sleeping 0 3 0 3  Tired, decreased energy 0 1 0 3  Change in appetite 0 3 3 3   Feeling bad or failure about yourself  0 0 0 1  Trouble concentrating 3 3 2 1   Moving slowly or fidgety/restless 0 1 0 0  Suicidal thoughts 0 0 0 0  PHQ-9  Score 3 16 9 15   Difficult doing work/chores Somewhat difficult Somewhat difficult - -    GAD 7 : Generalized Anxiety Score 04/24/2019 05/10/2017 03/13/2017  Nervous, Anxious, on Edge 3 1 3   Control/stop worrying 3 3 3   Worry too much - different things 3 3 3   Trouble relaxing 3 3 3   Restless 2 1 3   Easily annoyed or irritable 3 3 3   Afraid - awful might happen 2 3 3   Total GAD 7 Score 19 17 21   Anxiety Difficulty Somewhat difficult Somewhat difficult -      Past Medical History:  Diagnosis Date  . ADHD   . Anxiety   . Elevated ALT measurement 05/11/2017   AST:ALT 0.5  . Hypertension   . Hypertriglyceridemia 04/10/2019  . Obesity    Past Surgical History:  Procedure Laterality Date  . LEG SURGERY    . WRIST ARTHROSCOPY     Social History   Tobacco Use  . Smoking status: Current Every Day Smoker    Packs/day: 1.00  . Smokeless tobacco: Never Used  Substance Use Topics  . Alcohol use: Yes   family history includes Diabetes in his father; Hyperlipidemia in his mother; Hypertension in his mother.    ROS: negative except as noted in the HPI  Medications: Current Outpatient Medications  Medication Sig Dispense Refill  . amLODipine (NORVASC) 10 MG tablet Take 1 tablet (10 mg total) by mouth daily. 90 tablet 0  . amphetamine-dextroamphetamine (ADDERALL XR) 10 MG 24 hr capsule Take 1 capsule (10 mg total) by mouth daily. 30 capsule 0   No current facility-administered medications for this visit.    No Known Allergies     Objective:  BP 138/81   Pulse 75   Temp 98 F (36.7 C) (Oral)   Wt 238 lb (108 kg)   BMI 33.19 kg/m   Wt Readings from Last 3 Encounters:  04/24/19 238 lb (108 kg)  04/07/19 240 lb (108.9 kg)  08/23/17 250 lb (113.4 kg)   Temp Readings from Last 3 Encounters:  04/24/19 98 F (36.7 C) (Oral)  04/07/19 98.3 F (36.8 C) (Oral)  11/24/18 98.5 F (36.9 C) (Oral)   BP Readings from Last 3  Encounters:  04/24/19 138/81  04/07/19 (!)  150/97  11/24/18 (!) 162/103   Pulse Readings from Last 3 Encounters:  04/24/19 75  04/07/19 91  11/24/18 86    Gen:  alert, not ill-appearing, no distress, appropriate for age, obese male HEENT: head normocephalic without obvious abnormality, conjunctiva and cornea clear, trachea midline Pulm: Normal work of breathing, normal phonation, clear to auscultation bilaterally, no wheezes, rales or rhonchi CV: Normal rate, regular rhythm, s1 and s2 distinct, no murmurs, clicks or rubs  Neuro: alert and oriented x 3, no tremor MSK: extremities atraumatic, normal gait and station, trace peripheral edema Skin: intact, no rashes on exposed skin, no jaundice, no cyanosis Psych: well-groomed, cooperative, good eye contact, euthymic mood, affect mood-congruent, speech is articulate, and thought processes clear and goal-directed  Recent Results (from the past 2160 hour(s))  COMPLETE METABOLIC PANEL WITH GFR     Status: Abnormal   Collection Time: 04/07/19  4:16 PM  Result Value Ref Range   Glucose, Bld 86 65 - 99 mg/dL    Comment: .            Fasting reference interval .    BUN 13 7 - 25 mg/dL   Creat 4.690.99 6.290.60 - 5.281.35 mg/dL   GFR, Est Non African American 100 > OR = 60 mL/min/1.3573m2   GFR, Est African American 116 > OR = 60 mL/min/1.5773m2   BUN/Creatinine Ratio NOT APPLICABLE 6 - 22 (calc)   Sodium 139 135 - 146 mmol/L   Potassium 4.1 3.5 - 5.3 mmol/L   Chloride 104 98 - 110 mmol/L   CO2 26 20 - 32 mmol/L   Calcium 10.0 8.6 - 10.3 mg/dL   Total Protein 7.2 6.1 - 8.1 g/dL   Albumin 5.0 3.6 - 5.1 g/dL   Globulin 2.2 1.9 - 3.7 g/dL (calc)   AG Ratio 2.3 1.0 - 2.5 (calc)   Total Bilirubin 0.9 0.2 - 1.2 mg/dL   Alkaline phosphatase (APISO) 74 36 - 130 U/L   AST 34 10 - 40 U/L   ALT 56 (H) 9 - 46 U/L  TSH + free T4     Status: None   Collection Time: 04/07/19  4:16 PM  Result Value Ref Range   TSH W/REFLEX TO FT4 1.29 0.40 - 4.50 mIU/L  Urinalysis, Routine w reflex microscopic      Status: None   Collection Time: 04/07/19  4:16 PM  Result Value Ref Range   Color, Urine DARK YELLOW YELLOW   APPearance CLEAR CLEAR   Specific Gravity, Urine 1.025 1.001 - 1.03   pH < OR = 5.0 5.0 - 8.0   Glucose, UA NEGATIVE NEGATIVE   Bilirubin Urine NEGATIVE NEGATIVE   Ketones, ur NEGATIVE NEGATIVE   Hgb urine dipstick NEGATIVE NEGATIVE   Protein, ur NEGATIVE NEGATIVE   Nitrite NEGATIVE NEGATIVE   Leukocytes,Ua NEGATIVE NEGATIVE  Lipid Profile     Status: Abnormal   Collection Time: 04/07/19  4:16 PM  Result Value Ref Range   Cholesterol 184 <200 mg/dL   HDL 38 (L) > OR = 40 mg/dL   Triglycerides 413280 (H) <150 mg/dL    Comment: . If a non-fasting specimen was collected, consider repeat triglyceride testing on a fasting specimen if clinically indicated.  Perry MountJacobson et al. J. of Clin. Lipidol. 2015;9:129-169. Marland Kitchen.    LDL Cholesterol (Calc) 106 (H) mg/dL (calc)    Comment: Reference range: <100 . Desirable range <100 mg/dL for primary prevention;   <70 mg/dL  for patients with CHD or diabetic patients  with > or = 2 CHD risk factors. Marland Kitchen. LDL-C is now calculated using the Martin-Hopkins  calculation, which is a validated novel method providing  better accuracy than the Friedewald equation in the  estimation of LDL-C.  Horald PollenMartin SS et al. Lenox AhrJAMA. 4540;981(192013;310(19): 2061-2068  (http://education.[facility].com/faq/FAQ164)    Total CHOL/HDL Ratio 4.8 <5.0 (calc)   Non-HDL Cholesterol (Calc) 146 (H) <130 mg/dL (calc)    Comment: For patients with diabetes plus 1 major ASCVD risk  factor, treating to a non-HDL-C goal of <100 mg/dL  (LDL-C of <14<70 mg/dL) is considered a therapeutic  option.      No results found for this or any previous visit (from the past 72 hour(s)). No results found.    Assessment and Plan: 33 y.o. male with   .Luke Wilkerson was seen today for hypertension.  Diagnoses and all orders for this visit:  Adult ADHD -     amphetamine-dextroamphetamine (ADDERALL XR)  10 MG 24 hr capsule; Take 1 capsule (10 mg total) by mouth daily.  Social anxiety disorder  Hypertension goal BP (blood pressure) < 130/80 -     amLODipine (NORVASC) 10 MG tablet; Take 1 tablet (10 mg total) by mouth daily.  HTN Home BP are mostly in range Cont Amlodipine 10 mg Normal renal function Counseled on therapeutic lifestyle changes  Adult ADHD, Social anxiety Positive ASRS, self-reported diagnosis of ADHD in childhood PHQ2=0 GAD7=19 Declines treatment for anxiety. Did not respond to Lexapro or Buspar Re-start Adderall XR 10 mg. Counseled on potential AE/risks of stimulant Closely monitor BP at home and in office   Patient education and anticipatory guidance given Patient agrees with treatment plan Follow-up in 1 month or sooner as needed if symptoms worsen or fail to improve  Levonne Hubertharley E. Phyllis Abelson PA-C

## 2019-04-24 NOTE — H&P (View-Only) (Signed)
Luke Wilkerson Documented: 04/24/2019 3:27 PM Location: [facility] Patient #: [id number] DOB: [birth year] Single / Language: [place] / Race: White Male  History of Present Illness Luke Wilkerson(Lateia Fraser C. Wess Baney MD; 04/24/2019 3:53 PM) The patient is a 33 year old male who presents with a complaint of anal problems. Note for "Anal problems": ` ` ` Patient sent for surgical consultation at the request of Gena Frayharley Cummings, PA-C  Chief Complaint: Perianal pain and bleeding. ` ` The patient is a young smoking male. About 2 years ago he had episode of pain and swelling and had an abscess lanced near his anus. He believes it was in [facility] and 1 in the emergency room was. Since then he gets intermittent bleeding when he wipes and occasional intermittent pain. Concerned him and mentioned to primary care physician. Concern is some type of atypical cyst. Surgical consultation recommended. Patient comes today by himself. He usually moves his bowels once or twice a day. No bad bouts of constipation or diarrhea. He does smoke but less than a pack a day. He works from home. He is not a diabetic.  No personal nor family history of GI/colon cancer, inflammatory bowel disease, irritable bowel syndrome, allergy such as Celiac Sprue, dietary/dairy problems, colitis, ulcers nor gastritis. No recent sick contacts/gastroenteritis. No travel outside the country. No changes in diet. No dysphagia to solids or liquids. No significant heartburn or reflux. No hematochezia, hematemesis, coffee ground emesis. No evidence of prior gastric/peptic ulceration.  (Review of systems as stated in this history (HPI) or in the review of systems. Otherwise all other 12 point ROS are negative) ` ` `   Past Surgical History Ethlyn Gallery(Alisha Spillers, CMA; 04/24/2019 3:28 PM) Knee Surgery Right.  Diagnostic Studies History Ethlyn Gallery(Alisha Spillers, CMA; 04/24/2019 3:28 PM) Colonoscopy never  Allergies Elease Hashimoto(Alisha Spillers, CMA;  04/24/2019 3:29 PM) No Known Drug Allergies [04/24/2019]:  Medication History (Alisha Spillers, CMA; 04/24/2019 3:29 PM) Norvasc (10MG  Tablet, Oral) Active. Medications Reconciled  Social History Ethlyn Gallery(Alisha Spillers, CMA; 04/24/2019 3:28 PM) Alcohol use Recently quit alcohol use. No caffeine use No drug use Tobacco use Never smoker.  Family History Ethlyn Gallery(Alisha Spillers, CMA; 04/24/2019 3:28 PM) Cerebrovascular Accident Father. Diabetes Mellitus Father. Hypertension Father. Migraine Headache Sister.  Other Problems Ethlyn Gallery(Alisha Spillers, CMA; 04/24/2019 3:28 PM) Anxiety Disorder Back Pain High blood pressure     Review of Systems (Alisha Spillers CMA; 04/24/2019 3:28 PM) General Not Present- Appetite Loss, Chills, Fatigue, Fever, Night Sweats, Weight Gain and Weight Loss. Skin Not Present- Change in Wart/Mole, Dryness, Hives, Jaundice, New Lesions, Non-Healing Wounds, Rash and Ulcer. HEENT Not Present- Earache, Hearing Loss, Hoarseness, Nose Bleed, Oral Ulcers, Ringing in the Ears, Seasonal Allergies, Sinus Pain, Sore Throat, Visual Disturbances, Wears glasses/contact lenses and Yellow Eyes. Respiratory Present- Snoring. Not Present- Bloody sputum, Chronic Cough, Difficulty Breathing and Wheezing. Cardiovascular Not Present- Chest Pain, Difficulty Breathing Lying Down, Leg Cramps, Palpitations, Rapid Heart Rate, Shortness of Breath and Swelling of Extremities. Gastrointestinal Not Present- Abdominal Pain, Bloating, Bloody Stool, Change in Bowel Habits, Chronic diarrhea, Constipation, Difficulty Swallowing, Excessive gas, Gets full quickly at meals, Hemorrhoids, Indigestion, Nausea, Rectal Pain and Vomiting. Male Genitourinary Not Present- Blood in Urine, Change in Urinary Stream, Frequency, Impotence, Nocturia, Painful Urination, Urgency and Urine Leakage. Musculoskeletal Present- Back Pain and Joint Pain. Not Present- Joint Stiffness, Muscle Pain, Muscle Weakness and Swelling of  Extremities. Neurological Not Present- Decreased Memory, Fainting, Headaches, Numbness, Seizures, Tingling, Tremor, Trouble walking and Weakness. Psychiatric Present- Anxiety. Not Present- Bipolar, Change in  Sleep Pattern, Depression, Fearful and Frequent crying. Endocrine Not Present- Cold Intolerance, Excessive Hunger, Hair Changes, Heat Intolerance, Hot flashes and New Diabetes. Hematology Not Present- Blood Thinners, Easy Bruising, Excessive bleeding, Gland problems, HIV and Persistent Infections.  Vitals (Alisha Spillers CMA; 04/24/2019 3:28 PM) 04/24/2019 3:28 PM Weight: 239 lb Height: 71in Body Surface Area: 2.27 m Body Mass Index: 33.33 kg/m  Temp.: 97.1F(Oral)  Pulse: 89 (Regular)  BP: 116/80 (Sitting, Left Arm, Standard)        Physical Exam (Tiegan Terpstra C. Tyan Dy MD; 04/24/2019 3:38 PM)  General Mental Status-Alert. General Appearance-Not in acute distress, Not Sickly. Orientation-Oriented X3. Hydration-Well hydrated. Voice-Normal.  Integumentary Global Assessment Upon inspection and palpation of skin surfaces of the - Axillae: non-tender, no inflammation or ulceration, no drainage. and Distribution of scalp and body hair is normal. General Characteristics Temperature - normal warmth is noted. Note: Some folliculitis along the right lateral chest wall/lower axilla and perianal/perineal regions. not consistent with hidradenitis  Head and Neck Head-normocephalic, atraumatic with no lesions or palpable masses. Face Global Assessment - atraumatic, no absence of expression. Neck Global Assessment - no abnormal movements, no bruit auscultated on the right, no bruit auscultated on the left, no decreased range of motion, non-tender. Trachea-midline. Thyroid Gland Characteristics - non-tender.  Eye Eyeball - Left-Extraocular movements intact, No Nystagmus - Left. Eyeball - Right-Extraocular movements intact, No Nystagmus - Right. Cornea -  Left-No Hazy - Left. Cornea - Right-No Hazy - Right. Sclera/Conjunctiva - Left-No scleral icterus, No Discharge - Left. Sclera/Conjunctiva - Right-No scleral icterus, No Discharge - Right. Pupil - Left-Direct reaction to light normal. Pupil - Right-Direct reaction to light normal.  ENMT Ears Pinna - Left - no drainage observed, no generalized tenderness observed. Pinna - Right - no drainage observed, no generalized tenderness observed. Nose and Sinuses External Inspection of the Nose - no destructive lesion observed. Inspection of the nares - Left - quiet respiration. Inspection of the nares - Right - quiet respiration. Mouth and Throat Lips - Upper Lip - no fissures observed, no pallor noted. Lower Lip - no fissures observed, no pallor noted. Nasopharynx - no discharge present. Oral Cavity/Oropharynx - Tongue - no dryness observed. Oral Mucosa - no cyanosis observed. Hypopharynx - no evidence of airway distress observed.  Chest and Lung Exam Inspection Movements - Normal and Symmetrical. Accessory muscles - No use of accessory muscles in breathing. Palpation Palpation of the chest reveals - Non-tender. Auscultation Breath sounds - Normal and Clear.  Cardiovascular Auscultation Rhythm - Regular. Murmurs & Other Heart Sounds - Auscultation of the heart reveals - No Murmurs and No Systolic Clicks.  Abdomen Inspection Inspection of the abdomen reveals - No Visible peristalsis and No Abnormal pulsations. Umbilicus - No Bleeding, No Urine drainage. Palpation/Percussion Palpation and Percussion of the abdomen reveal - Soft, Non Tender, No Rebound tenderness, No Rigidity (guarding) and No Cutaneous hyperesthesia. Note: Abdomen soft. Nontender. Not distended. No umbilical or incisional hernias. No guarding.  Male Genitourinary Sexual Maturity Tanner 5 - Adult hair pattern and Adult penile size and shape. Note: No inguinal hernias. Normal external genitalia. Epididymi,  testes, and spermatic cords normal without any masses.  Rectal Note: Left lateral perianal sinus about 2 cm from anal verge. Cord felt to sphincter complex. Consistent with anorectal fistula. No active abscess. Tolerates digital exam. The enlarged hemorrhoids internally. No drainage with digital rectal wall pressure. Prostate smooth and not enlarged. No condyloma. No pilonidal disease. No external hemorrhoids. No fissure.  Peripheral Vascular Upper Extremity   Inspection - Left - No Cyanotic nailbeds - Left, Not Ischemic. Inspection - Right - No Cyanotic nailbeds - Right, Not Ischemic.  Neurologic Neurologic evaluation reveals -normal attention span and ability to concentrate, able to name objects and repeat phrases. Appropriate fund of knowledge , normal sensation and normal coordination. Mental Status Affect - not angry, not paranoid. Cranial Nerves-Normal Bilaterally. Gait-Normal.  Neuropsychiatric Mental status exam performed with findings of-able to articulate well with normal speech/language, rate, volume and coherence, thought content normal with ability to perform basic computations and apply abstract reasoning and no evidence of hallucinations, delusions, obsessions or homicidal/suicidal ideation.  Musculoskeletal Global Assessment Spine, Ribs and Pelvis - no instability, subluxation or laxity. Right Upper Extremity - no instability, subluxation or laxity.  Lymphatic Head & Neck  General Head & Neck Lymphatics: Bilateral - Description - No Localized lymphadenopathy. Axillary  General Axillary Region: Bilateral - Description - No Localized lymphadenopathy. Femoral & Inguinal  Generalized Femoral & Inguinal Lymphatics: Left - Description - No Localized lymphadenopathy. Right - Description - No Localized lymphadenopathy.    Assessment & Plan Luke Wilkerson(Nanette Wirsing C. Eldene Plocher MD; 04/24/2019 3:49 PM)  ANAL FISTULA (K60.3) Impression: Classic story for anal fistula confirmed on exam.   I think he would benefit from examination under anesthesia. If it is superficial, anal fistulotomy. If deeper intersphincteric may required layered lift repair. I don't think that he will need a seton since it doesn't seem that complicated nor is a high risk. However we will see.  He seems somewhat surprising disappointed that he needs surgery. Also surprises and he may feel pain and not feeling get back to work right away. I tried to frame expectations. He seems resigned to proceeding since nonoperative options are not good  Current Plans Pt Education - [facility] Abscess/Fistula (AT): discussed with patient and provided information. The anatomy & physiology of the anorectal region was discussed. We discussed the pathophysiology of anorectal abscess and fistula. Differential diagnosis was discussed. Natural history progression was discussed. I stressed the importance of a bowel regimen to have daily soft bowel movements to minimize progression of disease.  The patient's condition is not adequately controlled. Non-operative treatment has not healed the fistula. Therefore, I recommended examination under anaesthesia to confirm the diagnosis and treat the fistula. I discussed techniques that may be required such as fistulotomy, ligation by LIFT technique, and/or seton placement. Benefits & alternatives discussed. I noted a good likelihood this will help address the problem, but sometimes repeat operations and prolonged healing times may occur. Risks such as bleeding, pain, recurrence, reoperation, incontinence, heart attack, death, and other risks were discussed.  Educational handouts further explaining the pathology, treatment options, and bowel regimen were given. The patient expressed understanding & wishes to proceed. We will work to coordinate surgery for a mutually convenient time.   ENCOUNTER FOR PREOPERATIVE EXAMINATION FOR GENERAL SURGICAL PROCEDURE (Z01.818)  Current Plans You are  being scheduled for surgery- Our schedulers will call you.  You should hear from our office's scheduling department within 5 working days about the location, date, and time of surgery. We try to make accommodations for patient's preferences in scheduling surgery, but sometimes the OR schedule or the surgeon's schedule prevents us from making those accommodations.  If you have not heard from our office [phone]) in 5 working days, call the office and ask for your surgeon's nurse.  If you have other questions about your diagnosis, plan, or surgery, call the office and ask for your surgeon's nurse.  The anatomy  and the physiology was discussed. The pathophysiology and natural history of the disease was discussed. Options were discussed and recommendations were made. Technique, risks, benefits, & alternatives were discussed. Risks such as stroke, heart attack, bleeding, indection, death, and other risks discussed. Questions answered. The patient agrees to proceed. Pt Education - [facility] Rectal Prep for Anorectal outpatient/office surgery: discussed with patient and provided information. Pt Education - [facility] Rectal Surgery HCI (Jarmon Javid): discussed with patient and provided information. Pt Education - [facility] Good Bowel Health (Asmar Brozek)  TOBACCO ABUSE (Z72.0)  Current Plans Pt Education - [facility] STOP SMOKING! STOP SMOKING! We talked to the patient about the dangers of smoking.  We stressed that tobacco use dramatically increases the risk of peri-operative complications such as infection, tissue necrosis leaving to problems with incision/wound and organ healing, hernia, chronic pain, heart attack, stroke, DVT, pulmonary embolism, and death.  We noted there are programs in our community to help stop smoking.  Information was available.   Adin Hector, MD, FACS, MASCRS Gastrointestinal and Minimally Invasive Surgery    [address] [place], [place] [address] [phone] Main /  Paging [phone] Fax

## 2019-04-24 NOTE — Patient Instructions (Signed)

## 2019-04-24 NOTE — H&P (Signed)
Luke Wilkerson C Crumble Documented: 04/24/2019 3:27 PM Location: [facility] Patient #: [id number] DOB: [birth year] Single / Language: [place] / Race: White Male  History of Present Illness Luke Wilkerson(Luke Mcgaughey C. Laasia Arcos MD; 04/24/2019 3:53 PM) The patient is a 33 year old male who presents with a complaint of anal problems. Note for "Anal problems": ` ` ` Patient sent for surgical consultation at the request of Luke Frayharley Cummings, PA-C  Chief Complaint: Perianal pain and bleeding. ` ` The patient is a young smoking male. About 2 years ago he had episode of pain and swelling and had an abscess lanced near his anus. He believes it was in [facility] and 1 in the emergency room was. Since then he gets intermittent bleeding when he wipes and occasional intermittent pain. Concerned him and mentioned to primary care physician. Concern is some type of atypical cyst. Surgical consultation recommended. Patient comes today by himself. He usually moves his bowels once or twice a day. No bad bouts of constipation or diarrhea. He does smoke but less than a pack a day. He works from home. He is not a diabetic.  No personal nor family history of GI/colon cancer, inflammatory bowel disease, irritable bowel syndrome, allergy such as Celiac Sprue, dietary/dairy problems, colitis, ulcers nor gastritis. No recent sick contacts/gastroenteritis. No travel outside the country. No changes in diet. No dysphagia to solids or liquids. No significant heartburn or reflux. No hematochezia, hematemesis, coffee ground emesis. No evidence of prior gastric/peptic ulceration.  (Review of systems as stated in this history (HPI) or in the review of systems. Otherwise all other 12 point ROS are negative) ` ` `   Past Surgical History Ethlyn Gallery(Alisha Spillers, CMA; 04/24/2019 3:28 PM) Knee Surgery Right.  Diagnostic Studies History Ethlyn Gallery(Alisha Spillers, CMA; 04/24/2019 3:28 PM) Colonoscopy never  Allergies Luke Wilkerson(Alisha Spillers, CMA;  04/24/2019 3:29 PM) No Known Drug Allergies [04/24/2019]:  Medication History (Alisha Spillers, CMA; 04/24/2019 3:29 PM) Norvasc (10MG  Tablet, Oral) Active. Medications Reconciled  Social History Ethlyn Gallery(Alisha Spillers, CMA; 04/24/2019 3:28 PM) Alcohol use Recently quit alcohol use. No caffeine use No drug use Tobacco use Never smoker.  Family History Ethlyn Gallery(Alisha Spillers, CMA; 04/24/2019 3:28 PM) Cerebrovascular Accident Father. Diabetes Mellitus Father. Hypertension Father. Migraine Headache Sister.  Other Problems Ethlyn Gallery(Alisha Spillers, CMA; 04/24/2019 3:28 PM) Anxiety Disorder Back Pain High blood pressure     Review of Systems (Alisha Spillers CMA; 04/24/2019 3:28 PM) General Not Present- Appetite Loss, Chills, Fatigue, Fever, Night Sweats, Weight Gain and Weight Loss. Skin Not Present- Change in Wart/Mole, Dryness, Hives, Jaundice, New Lesions, Non-Healing Wounds, Rash and Ulcer. HEENT Not Present- Earache, Hearing Loss, Hoarseness, Nose Bleed, Oral Ulcers, Ringing in the Ears, Seasonal Allergies, Sinus Pain, Sore Throat, Visual Disturbances, Wears glasses/contact lenses and Yellow Eyes. Respiratory Present- Snoring. Not Present- Bloody sputum, Chronic Cough, Difficulty Breathing and Wheezing. Cardiovascular Not Present- Chest Pain, Difficulty Breathing Lying Down, Leg Cramps, Palpitations, Rapid Heart Rate, Shortness of Breath and Swelling of Extremities. Gastrointestinal Not Present- Abdominal Pain, Bloating, Bloody Stool, Change in Bowel Habits, Chronic diarrhea, Constipation, Difficulty Swallowing, Excessive gas, Gets full quickly at meals, Hemorrhoids, Indigestion, Nausea, Rectal Pain and Vomiting. Male Genitourinary Not Present- Blood in Urine, Change in Urinary Stream, Frequency, Impotence, Nocturia, Painful Urination, Urgency and Urine Leakage. Musculoskeletal Present- Back Pain and Joint Pain. Not Present- Joint Stiffness, Muscle Pain, Muscle Weakness and Swelling of  Extremities. Neurological Not Present- Decreased Memory, Fainting, Headaches, Numbness, Seizures, Tingling, Tremor, Trouble walking and Weakness. Psychiatric Present- Anxiety. Not Present- Bipolar, Change in  Sleep Pattern, Depression, Fearful and Frequent crying. Endocrine Not Present- Cold Intolerance, Excessive Hunger, Hair Changes, Heat Intolerance, Hot flashes and New Diabetes. Hematology Not Present- Blood Thinners, Easy Bruising, Excessive bleeding, Gland problems, HIV and Persistent Infections.  Vitals (Alisha Spillers CMA; 04/24/2019 3:28 PM) 04/24/2019 3:28 PM Weight: 239 lb Height: 71in Body Surface Area: 2.27 m Body Mass Index: 33.33 kg/m  Temp.: 97.87F(Oral)  Pulse: 89 (Regular)  BP: 116/80 (Sitting, Left Arm, Standard)        Physical Exam Luke Wilkerson(Lanyah Spengler C. Tanishka Drolet MD; 04/24/2019 3:38 PM)  General Mental Status-Alert. General Appearance-Not in acute distress, Not Sickly. Orientation-Oriented X3. Hydration-Well hydrated. Voice-Normal.  Integumentary Global Assessment Upon inspection and palpation of skin surfaces of the - Axillae: non-tender, no inflammation or ulceration, no drainage. and Distribution of scalp and body hair is normal. General Characteristics Temperature - normal warmth is noted. Note: Some folliculitis along the right lateral chest wall/lower axilla and perianal/perineal regions. not consistent with hidradenitis  Head and Neck Head-normocephalic, atraumatic with no lesions or palpable masses. Face Global Assessment - atraumatic, no absence of expression. Neck Global Assessment - no abnormal movements, no bruit auscultated on the right, no bruit auscultated on the left, no decreased range of motion, non-tender. Trachea-midline. Thyroid Gland Characteristics - non-tender.  Eye Eyeball - Left-Extraocular movements intact, No Nystagmus - Left. Eyeball - Right-Extraocular movements intact, No Nystagmus - Right. Cornea -  Left-No Hazy - Left. Cornea - Right-No Hazy - Right. Sclera/Conjunctiva - Left-No scleral icterus, No Discharge - Left. Sclera/Conjunctiva - Right-No scleral icterus, No Discharge - Right. Pupil - Left-Direct reaction to light normal. Pupil - Right-Direct reaction to light normal.  ENMT Ears Pinna - Left - no drainage observed, no generalized tenderness observed. Pinna - Right - no drainage observed, no generalized tenderness observed. Nose and Sinuses External Inspection of the Nose - no destructive lesion observed. Inspection of the nares - Left - quiet respiration. Inspection of the nares - Right - quiet respiration. Mouth and Throat Lips - Upper Lip - no fissures observed, no pallor noted. Lower Lip - no fissures observed, no pallor noted. Nasopharynx - no discharge present. Oral Cavity/Oropharynx - Tongue - no dryness observed. Oral Mucosa - no cyanosis observed. Hypopharynx - no evidence of airway distress observed.  Chest and Lung Exam Inspection Movements - Normal and Symmetrical. Accessory muscles - No use of accessory muscles in breathing. Palpation Palpation of the chest reveals - Non-tender. Auscultation Breath sounds - Normal and Clear.  Cardiovascular Auscultation Rhythm - Regular. Murmurs & Other Heart Sounds - Auscultation of the heart reveals - No Murmurs and No Systolic Clicks.  Abdomen Inspection Inspection of the abdomen reveals - No Visible peristalsis and No Abnormal pulsations. Umbilicus - No Bleeding, No Urine drainage. Palpation/Percussion Palpation and Percussion of the abdomen reveal - Soft, Non Tender, No Rebound tenderness, No Rigidity (guarding) and No Cutaneous hyperesthesia. Note: Abdomen soft. Nontender. Not distended. No umbilical or incisional hernias. No guarding.  Male Genitourinary Sexual Maturity Tanner 5 - Adult hair pattern and Adult penile size and shape. Note: No inguinal hernias. Normal external genitalia. Epididymi,  testes, and spermatic cords normal without any masses.  Rectal Note: Left lateral perianal sinus about 2 cm from anal verge. Cord felt to sphincter complex. Consistent with anorectal fistula. No active abscess. Tolerates digital exam. The enlarged hemorrhoids internally. No drainage with digital rectal wall pressure. Prostate smooth and not enlarged. No condyloma. No pilonidal disease. No external hemorrhoids. No fissure.  Peripheral Vascular Upper Extremity  Inspection - Left - No Cyanotic nailbeds - Left, Not Ischemic. Inspection - Right - No Cyanotic nailbeds - Right, Not Ischemic.  Neurologic Neurologic evaluation reveals -normal attention span and ability to concentrate, able to name objects and repeat phrases. Appropriate fund of knowledge , normal sensation and normal coordination. Mental Status Affect - not angry, not paranoid. Cranial Nerves-Normal Bilaterally. Gait-Normal.  Neuropsychiatric Mental status exam performed with findings of-able to articulate well with normal speech/language, rate, volume and coherence, thought content normal with ability to perform basic computations and apply abstract reasoning and no evidence of hallucinations, delusions, obsessions or homicidal/suicidal ideation.  Musculoskeletal Global Assessment Spine, Ribs and Pelvis - no instability, subluxation or laxity. Right Upper Extremity - no instability, subluxation or laxity.  Lymphatic Head & Neck  General Head & Neck Lymphatics: Bilateral - Description - No Localized lymphadenopathy. Axillary  General Axillary Region: Bilateral - Description - No Localized lymphadenopathy. Femoral & Inguinal  Generalized Femoral & Inguinal Lymphatics: Left - Description - No Localized lymphadenopathy. Right - Description - No Localized lymphadenopathy.    Assessment & Plan Luke Wilkerson(Smith Mcnicholas C. Kiyomi Pallo MD; 04/24/2019 3:49 PM)  ANAL FISTULA (K60.3) Impression: Classic story for anal fistula confirmed on exam.   I think he would benefit from examination under anesthesia. If it is superficial, anal fistulotomy. If deeper intersphincteric may required layered lift repair. I don't think that he will need a seton since it doesn't seem that complicated nor is a high risk. However we will see.  He seems somewhat surprising disappointed that he needs surgery. Also surprises and he may feel pain and not feeling get back to work right away. I tried to frame expectations. He seems resigned to proceeding since nonoperative options are not good  Current Plans Pt Education - CCS Abscess/Fistula (AT): discussed with patient and provided information. The anatomy & physiology of the anorectal region was discussed. We discussed the pathophysiology of anorectal abscess and fistula. Differential diagnosis was discussed. Natural history progression was discussed. I stressed the importance of a bowel regimen to have daily soft bowel movements to minimize progression of disease.  The patient's condition is not adequately controlled. Non-operative treatment has not healed the fistula. Therefore, I recommended examination under anaesthesia to confirm the diagnosis and treat the fistula. I discussed techniques that may be required such as fistulotomy, ligation by LIFT technique, and/or seton placement. Benefits & alternatives discussed. I noted a good likelihood this will help address the problem, but sometimes repeat operations and prolonged healing times may occur. Risks such as bleeding, pain, recurrence, reoperation, incontinence, heart attack, death, and other risks were discussed.  Educational handouts further explaining the pathology, treatment options, and bowel regimen were given. The patient expressed understanding & wishes to proceed. We will work to coordinate surgery for a mutually convenient time.   ENCOUNTER FOR PREOPERATIVE EXAMINATION FOR GENERAL SURGICAL PROCEDURE (Z01.818)  Current Plans You are  being scheduled for surgery- Our schedulers will call you.  You should hear from our office's scheduling department within 5 working days about the location, date, and time of surgery. We try to make accommodations for patient's preferences in scheduling surgery, but sometimes the OR schedule or the surgeon's schedule prevents us from making those accommodations.  If you have not heard from our office [phone]) in 5 working days, call the office and ask for your surgeon's nurse.  If you have other questions about your diagnosis, plan, or surgery, call the office and ask for your surgeon's nurse.  The anatomy  and the physiology was discussed. The pathophysiology and natural history of the disease was discussed. Options were discussed and recommendations were made. Technique, risks, benefits, & alternatives were discussed. Risks such as stroke, heart attack, bleeding, indection, death, and other risks discussed. Questions answered. The patient agrees to proceed. Pt Education - CCS Rectal Prep for Anorectal outpatient/office surgery: discussed with patient and provided information. Pt Education - CCS Rectal Surgery HCI (Chyler Creely): discussed with patient and provided information. Pt Education - CCS Good Bowel Health (Larri Yehle)  TOBACCO ABUSE (Z72.0)  Current Plans Pt Education - CCS STOP SMOKING! STOP SMOKING! We talked to the patient about the dangers of smoking.  We stressed that tobacco use dramatically increases the risk of peri-operative complications such as infection, tissue necrosis leaving to problems with incision/wound and organ healing, hernia, chronic pain, heart attack, stroke, DVT, pulmonary embolism, and death.  We noted there are programs in our community to help stop smoking.  Information was available.   Adin Hector, MD, FACS, MASCRS Gastrointestinal and Minimally Invasive Surgery    [address] [place], [place] [address] [phone] Main /  Paging [phone] Fax

## 2019-05-05 NOTE — Patient Instructions (Addendum)
DUE TO COVID-19 ONLY ONE VISITOR IS ALLOWED TO COME WITH YOU AND STAY IN THE WAITING ROOM ONLY DURING PRE OP AND PROCEDURE DAY OF SURGERY. THE 1 VISITOR MAY VISIT WITH YOU AFTER SURGERY IN YOUR PRIVATE ROOM DURING VISITING HOURS ONLY!  YOU NEED TO HAVE A COVID 19 TEST ON_9/8______ @_______ , THIS TEST MUST BE DONE BEFORE SURGERY, COME  [address], [place] , [phone].  [facility]) ONCE YOUR COVID TEST IS COMPLETED, PLEASE BEGIN THE QUARANTINE INSTRUCTIONS AS OUTLINED IN YOUR HANDOUT.                Luke BrilliantDaniel Wilkerson    Your procedure is scheduled on: Friday 05/12/19   [facility]   Report to admitting at   6:00 AM     Call this number if you have problems the morning of surgery [phone]    Remember: Do not eat food After Midnight  The night before surgery.  Clear liquids until 5:00 AM     CLEAR LIQUID DIET   Foods Allowed                                                                     Foods Excluded  Coffee and tea, regular and decaf                             liquids that you cannot  Plain Jell-O any favor except red or purple                                           see through such as: Fruit ices (not with fruit pulp)                                     milk, soups, orange juice  Iced Popsicles                                    All solid food Carbonated beverages, regular and diet                                    Cranberry, grape and apple juices Sports drinks like Gatorade Lightly seasoned clear broth or consume(fat free) Sugar, honey syrup  Sample Menu Breakfast                                Lunch                                     Supper Cranberry juice                    Beef broth  Chicken broth Jell-O                                     Grape juice                           Apple juice Coffee or tea                        Jell-O                                       Popsicle                                                Coffee or tea                        Coffee or tea  _____________________________________________________________________   . BRUSH YOUR TEETH MORNING OF SURGERY AND RINSE YOUR MOUTH OUT, NO CHEWING GUM CANDY OR MINTS.     Take these medicines the morning of surgery with A SIP OF WATER: Amlodipine              Please follow Drt. Gross's instructions for rectal prep                                   You may not have any metal on your body including piercings              Do not wear jewelry,  lotions, powders or deodorant                      Men may shave face and neck.   Do not bring valuables to the hospital. [facility] IS NOT             RESPONSIBLE   FOR VALUABLES.  Contacts, dentures or bridgework may not be worn into surgery.      Patients discharged the day of surgery will not be allowed to drive home.   IF YOU ARE HAVING SURGERY AND GOING HOME THE SAME DAY, YOU MUST HAVE AN ADULT TO DRIVE YOU HOME AND BE WITH YOU FOR 24 HOURS.  YOU MAY GO HOME BY TAXI OR UBER OR ORTHERWISE, BUT AN ADULT MUST ACCOMPANY YOU HOME AND STAY WITH YOU FOR 24 HOURS.  Name and phone number of your driver:  Special Instructions: N/A              Please read over the following fact sheets you were given: _____________________________________________________________________             [facility] - Preparing for Surgery  Before surgery, you can play an important role.   Because skin is not sterile, your skin needs to be as free of germs as possible.   You can reduce the number of germs on your skin by washing with CHG (chlorahexidine gluconate) soap before surgery.   CHG is an antiseptic cleaner which kills germs and bonds with the skin to continue killing germs even  after washing. Please DO NOT use if you have an allergy to CHG or antibacterial soaps.   If your skin becomes reddened/irritated stop using the CHG and inform your  nurse when you arrive at Short Stay. r.  You may shave your face/neck. Please follow these instructions carefully:  1.  Shower with CHG Soap the night before surgery and the  morning of Surgery.  2.  If you choose to wash your hair, wash your hair first as usual with your  normal  shampoo.  3.  After you shampoo, rinse your hair and body thoroughly to remove the  shampoo.                                        4.  Use CHG as you would any other liquid soap.  You can apply chg directly  to the skin and wash                       Gently with a scrungie or clean washcloth.  5.  Apply the CHG Soap to your body ONLY FROM THE NECK DOWN.   Do not use on face/ open                           Wound or open sores. Avoid contact with eyes, ears mouth and genitals (private parts).                       Wash face,  Genitals (private parts) with your normal soap.             6.  Wash thoroughly, paying special attention to the area where your surgery  will be performed.  7.  Thoroughly rinse your body with warm water from the neck down.  8.  DO NOT shower/wash with your normal soap after using and rinsing off  the CHG Soap.                9.  Pat yourself dry with a clean towel.            10.  Wear clean pajamas.            11.  Place clean sheets on your bed the night of your first shower and do not  sleep with pets. Day of Surgery : Do not apply any lotions/deodorants the morning of surgery.  Please wear clean clothes to the hospital/surgery center.  FAILURE TO FOLLOW THESE INSTRUCTIONS MAY RESULT IN THE CANCELLATION OF YOUR SURGERY PATIENT SIGNATURE_________________________________  NURSE SIGNATURE__________________________________  ________________________________________________________________________

## 2019-05-09 ENCOUNTER — Other Ambulatory Visit: Payer: Self-pay

## 2019-05-09 ENCOUNTER — Encounter (HOSPITAL_COMMUNITY): Payer: Self-pay

## 2019-05-09 ENCOUNTER — Other Ambulatory Visit (HOSPITAL_COMMUNITY)
Admission: RE | Admit: 2019-05-09 | Discharge: 2019-05-09 | Disposition: A | Discharge status: Home / Self Care | Payer: BC Managed Care – PPO | Source: Ambulatory Visit | Attending: Surgery | Admitting: Surgery

## 2019-05-09 ENCOUNTER — Encounter (HOSPITAL_COMMUNITY)
Admission: RE | Admit: 2019-05-09 | Discharge: 2019-05-09 | Disposition: A | Discharge status: Home / Self Care | Payer: BC Managed Care – PPO | Source: Ambulatory Visit | Attending: Surgery | Admitting: Surgery

## 2019-05-09 DIAGNOSIS — K603 Anal fistula: Secondary | ICD-10-CM | POA: Diagnosis not present

## 2019-05-09 DIAGNOSIS — I10 Essential (primary) hypertension: Secondary | ICD-10-CM | POA: Diagnosis not present

## 2019-05-09 DIAGNOSIS — Z20828 Contact with and (suspected) exposure to other viral communicable diseases: Secondary | ICD-10-CM | POA: Insufficient documentation

## 2019-05-09 DIAGNOSIS — Z01818 Encounter for other preprocedural examination: Secondary | ICD-10-CM | POA: Diagnosis not present

## 2019-05-09 LAB — CBC
HCT: 46.4 % (ref 39.0–52.0)
Hemoglobin: 16.3 g/dL (ref 13.0–17.0)
MCH: 35.1 pg — ABNORMAL HIGH (ref 26.0–34.0)
MCHC: 35.1 g/dL (ref 30.0–36.0)
MCV: 99.8 fL (ref 80.0–100.0)
Platelets: 195 10*3/uL (ref 150–400)
RBC: 4.65 MIL/uL (ref 4.22–5.81)
RDW: 11.5 % (ref 11.5–15.5)
WBC: 10.4 10*3/uL (ref 4.0–10.5)
nRBC: 0 % (ref 0.0–0.2)

## 2019-05-09 LAB — BASIC METABOLIC PANEL
Anion gap: 11 (ref 5–15)
BUN: 9 mg/dL (ref 6–20)
CO2: 24 mmol/L (ref 22–32)
Calcium: 9.4 mg/dL (ref 8.9–10.3)
Chloride: 104 mmol/L (ref 98–111)
Creatinine, Ser: 0.89 mg/dL (ref 0.61–1.24)
GFR calc Af Amer: >60 mL/min (ref >60)
GFR calc non Af Amer: >60 mL/min (ref >60)
Glucose, Bld: 100 mg/dL — ABNORMAL HIGH (ref 70–99)
Potassium: 4.5 mmol/L (ref 3.5–5.1)
Sodium: 139 mmol/L (ref 135–145)

## 2019-05-09 NOTE — Progress Notes (Signed)
PCP -Rob Bunting PA  Cardiologist -none   Chest x-ray - no EKG - no Stress Test - no ECHO - no Cardiac Cath - no  Sleep Study - yes can't remember results CPAP - no   Fasting Blood Sugar - NA Checks Blood Sugar _____ times a day  Blood Thinner Instructions:no Aspirin Instructions: Last Dose:  Anesthesia review:   Patient denies shortness of breath, fever, cough and chest pain at PAT appointment yes  Patient verbalized understanding of instructions that were given to them at the PAT appointment. Patient was also instructed that they will need to review over the PAT instructions again at home before surgery. Yes Pt has an open area near buttocks. He says it is "Staph". Konrad Felix PA instructed pt to call Dr. Johney Maine and let him know. Pt also had other questions to ask the Dr about pain and rectal prep.

## 2019-05-10 LAB — NOVEL CORONAVIRUS, NAA (HOSP ORDER, SEND-OUT TO REF LAB; TAT 18-24 HRS): SARS-CoV-2, NAA: NOT DETECTED

## 2019-05-11 MED ORDER — BUPIVACAINE LIPOSOME 1.3 % IJ SUSP
20.0000 mL | INTRAMUSCULAR | Status: DC
  Filled 2019-05-11: qty 20

## 2019-05-11 NOTE — Anesthesia Preprocedure Evaluation (Addendum)
Anesthesia Evaluation  Patient identified by MRN, date of birth, ID band Patient awake    Reviewed: Allergy & Precautions, NPO status , Patient's Chart, lab work & pertinent test results  Airway Mallampati: II       Dental no notable dental hx. (+) Teeth Intact   Pulmonary Current Smoker,    Pulmonary exam normal breath sounds clear to auscultation       Cardiovascular hypertension, Pt. on medications Normal cardiovascular exam Rhythm:Regular Rate:Normal     Neuro/Psych Anxiety    GI/Hepatic negative GI ROS, Neg liver ROS,   Endo/Other  negative endocrine ROS  Renal/GU negative Renal ROS  negative genitourinary   Musculoskeletal negative musculoskeletal ROS (+)   Abdominal (+) + obese,   Peds  Hematology negative hematology ROS (+)   Anesthesia Other Findings   Reproductive/Obstetrics                            Anesthesia Physical Anesthesia Plan  ASA: II  Anesthesia Plan: General   Post-op Pain Management:    Induction: Intravenous  PONV Risk Score and Plan: 2 and Ondansetron, Dexamethasone and Midazolam  Airway Management Planned: Oral ETT  Additional Equipment: None  Intra-op Plan:   Post-operative Plan: Extubation in OR  Informed Consent: I have reviewed the patients History and Physical, chart, labs and discussed the procedure including the risks, benefits and alternatives for the proposed anesthesia with the patient or authorized representative who has indicated his/her understanding and acceptance.     Dental advisory given  Plan Discussed with: CRNA  Anesthesia Plan Comments:        Anesthesia Quick Evaluation

## 2019-05-12 ENCOUNTER — Encounter (HOSPITAL_COMMUNITY)
Admission: RE | Disposition: A | Discharge status: Home / Self Care | Payer: Self-pay | Source: Ambulatory Visit | Attending: Surgery

## 2019-05-12 ENCOUNTER — Ambulatory Visit (HOSPITAL_COMMUNITY): Payer: BC Managed Care – PPO | Admitting: Physician Assistant

## 2019-05-12 ENCOUNTER — Ambulatory Visit (HOSPITAL_COMMUNITY)
Admission: RE | Admit: 2019-05-12 | Discharge: 2019-05-12 | Disposition: A | Discharge status: Home / Self Care | Payer: BC Managed Care – PPO | Source: Ambulatory Visit | Attending: Surgery | Admitting: Surgery

## 2019-05-12 ENCOUNTER — Encounter (HOSPITAL_COMMUNITY): Payer: Self-pay

## 2019-05-12 ENCOUNTER — Ambulatory Visit (HOSPITAL_COMMUNITY): Payer: BC Managed Care – PPO | Admitting: Registered Nurse

## 2019-05-12 DIAGNOSIS — I10 Essential (primary) hypertension: Secondary | ICD-10-CM | POA: Diagnosis not present

## 2019-05-12 DIAGNOSIS — E669 Obesity, unspecified: Secondary | ICD-10-CM | POA: Diagnosis not present

## 2019-05-12 DIAGNOSIS — Z833 Family history of diabetes mellitus: Secondary | ICD-10-CM | POA: Insufficient documentation

## 2019-05-12 DIAGNOSIS — F418 Other specified anxiety disorders: Secondary | ICD-10-CM | POA: Insufficient documentation

## 2019-05-12 DIAGNOSIS — Z79899 Other long term (current) drug therapy: Secondary | ICD-10-CM | POA: Diagnosis not present

## 2019-05-12 DIAGNOSIS — K603 Anal fistula, unspecified: Secondary | ICD-10-CM

## 2019-05-12 DIAGNOSIS — M549 Dorsalgia, unspecified: Secondary | ICD-10-CM | POA: Diagnosis not present

## 2019-05-12 DIAGNOSIS — F329 Major depressive disorder, single episode, unspecified: Secondary | ICD-10-CM | POA: Diagnosis not present

## 2019-05-12 DIAGNOSIS — Z6832 Body mass index (BMI) 32.0-32.9, adult: Secondary | ICD-10-CM | POA: Diagnosis not present

## 2019-05-12 DIAGNOSIS — Z8249 Family history of ischemic heart disease and other diseases of the circulatory system: Secondary | ICD-10-CM | POA: Insufficient documentation

## 2019-05-12 DIAGNOSIS — E781 Pure hyperglyceridemia: Secondary | ICD-10-CM | POA: Diagnosis not present

## 2019-05-12 DIAGNOSIS — K604 Rectal fistula: Secondary | ICD-10-CM | POA: Diagnosis not present

## 2019-05-12 DIAGNOSIS — F909 Attention-deficit hyperactivity disorder, unspecified type: Secondary | ICD-10-CM | POA: Insufficient documentation

## 2019-05-12 DIAGNOSIS — Z720 Tobacco use: Secondary | ICD-10-CM | POA: Diagnosis present

## 2019-05-12 DIAGNOSIS — F4010 Social phobia, unspecified: Secondary | ICD-10-CM | POA: Diagnosis present

## 2019-05-12 DIAGNOSIS — K760 Fatty (change of) liver, not elsewhere classified: Secondary | ICD-10-CM | POA: Diagnosis not present

## 2019-05-12 DIAGNOSIS — F17210 Nicotine dependence, cigarettes, uncomplicated: Secondary | ICD-10-CM | POA: Diagnosis not present

## 2019-05-12 DIAGNOSIS — J189 Pneumonia, unspecified organism: Secondary | ICD-10-CM | POA: Diagnosis not present

## 2019-05-12 HISTORY — PX: EVALUATION UNDER ANESTHESIA WITH HEMORRHOIDECTOMY: SHX5624

## 2019-05-12 SURGERY — EXAM UNDER ANESTHESIA WITH HEMORRHOIDECTOMY
Anesthesia: General | Site: Rectum

## 2019-05-12 MED ORDER — CELECOXIB 200 MG PO CAPS
200.0000 mg | ORAL_CAPSULE | ORAL | Status: AC
  Administered 2019-05-12: 07:00:00 200 mg via ORAL
  Filled 2019-05-12: qty 1

## 2019-05-12 MED ORDER — OXYCODONE HCL 5 MG PO TABS: 5.0000 mg | ORAL_TABLET | Freq: Four times a day (QID) | ORAL | 0 refills | Status: DC | PRN

## 2019-05-12 MED ORDER — KETOROLAC TROMETHAMINE 30 MG/ML IJ SOLN: 30.0000 mg | Freq: Once | INTRAMUSCULAR | Status: DC | PRN

## 2019-05-12 MED ORDER — METRONIDAZOLE IN NACL 5-0.79 MG/ML-% IV SOLN
500.0000 mg | INTRAVENOUS | Status: AC
  Administered 2019-05-12: 500 mg via INTRAVENOUS
  Filled 2019-05-12: qty 100

## 2019-05-12 MED ORDER — LACTATED RINGERS IV SOLN
INTRAVENOUS | Status: DC
  Administered 2019-05-12: 07:00:00 via INTRAVENOUS

## 2019-05-12 MED ORDER — FENTANYL CITRATE (PF) 100 MCG/2ML IJ SOLN: 25.0000 ug | INTRAMUSCULAR | Status: DC | PRN

## 2019-05-12 MED ORDER — SUGAMMADEX SODIUM 200 MG/2ML IV SOLN
INTRAVENOUS | Status: DC | PRN
  Administered 2019-05-12: 400 mg via INTRAVENOUS

## 2019-05-12 MED ORDER — CHLORHEXIDINE GLUCONATE CLOTH 2 % EX PADS: 6.0000 | MEDICATED_PAD | Freq: Once | CUTANEOUS | Status: DC

## 2019-05-12 MED ORDER — METHYLENE BLUE 1 % INJ SOLN
INTRAMUSCULAR | Status: DC | PRN
  Administered 2019-05-12: 2 mL

## 2019-05-12 MED ORDER — BUPIVACAINE LIPOSOME 1.3 % IJ SUSP
INTRAMUSCULAR | Status: DC | PRN
  Administered 2019-05-12: 20 mL

## 2019-05-12 MED ORDER — IBUPROFEN 200 MG PO TABS: 200.0000 mg | ORAL_TABLET | Freq: Four times a day (QID) | ORAL | Status: DC | PRN

## 2019-05-12 MED ORDER — SUCCINYLCHOLINE CHLORIDE 200 MG/10ML IV SOSY
PREFILLED_SYRINGE | INTRAVENOUS | Status: AC
  Filled 2019-05-12: qty 10

## 2019-05-12 MED ORDER — ROCURONIUM BROMIDE 10 MG/ML (PF) SYRINGE
PREFILLED_SYRINGE | INTRAVENOUS | Status: DC | PRN
  Administered 2019-05-12: 10 mg via INTRAVENOUS
  Administered 2019-05-12: 40 mg via INTRAVENOUS

## 2019-05-12 MED ORDER — ACETAMINOPHEN 500 MG PO TABS
1000.0000 mg | ORAL_TABLET | ORAL | Status: AC
  Administered 2019-05-12: 07:00:00 1000 mg via ORAL
  Filled 2019-05-12: qty 2

## 2019-05-12 MED ORDER — LIDOCAINE 2% (20 MG/ML) 5 ML SYRINGE
INTRAMUSCULAR | Status: DC | PRN
  Administered 2019-05-12: 100 mg via INTRAVENOUS

## 2019-05-12 MED ORDER — GABAPENTIN 300 MG PO CAPS
300.0000 mg | ORAL_CAPSULE | ORAL | Status: AC
  Administered 2019-05-12: 300 mg via ORAL
  Filled 2019-05-12: qty 1

## 2019-05-12 MED ORDER — FENTANYL CITRATE (PF) 250 MCG/5ML IJ SOLN
INTRAMUSCULAR | Status: DC | PRN
  Administered 2019-05-12: 50 ug via INTRAVENOUS
  Administered 2019-05-12: 100 ug via INTRAVENOUS
  Administered 2019-05-12 (×2): 50 ug via INTRAVENOUS

## 2019-05-12 MED ORDER — SODIUM CHLORIDE 0.9 % IV SOLN
2.0000 g | INTRAVENOUS | Status: AC
  Administered 2019-05-12: 2 g via INTRAVENOUS
  Filled 2019-05-12: qty 20

## 2019-05-12 MED ORDER — METHYLENE BLUE 0.5 % INJ SOLN
INTRAVENOUS | Status: AC
  Filled 2019-05-12: qty 10

## 2019-05-12 MED ORDER — PROMETHAZINE HCL 25 MG/ML IJ SOLN: 6.2500 mg | INTRAMUSCULAR | Status: DC | PRN

## 2019-05-12 MED ORDER — OXYCODONE HCL 5 MG/5ML PO SOLN: 5.0000 mg | Freq: Once | ORAL | Status: DC | PRN

## 2019-05-12 MED ORDER — PROPOFOL 10 MG/ML IV BOLUS
INTRAVENOUS | Status: DC | PRN
  Administered 2019-05-12: 200 mg via INTRAVENOUS

## 2019-05-12 MED ORDER — OXYCODONE HCL 5 MG PO TABS: 5.0000 mg | ORAL_TABLET | Freq: Once | ORAL | Status: DC | PRN

## 2019-05-12 MED ORDER — MEPERIDINE HCL 50 MG/ML IJ SOLN: 6.2500 mg | INTRAMUSCULAR | Status: DC | PRN

## 2019-05-12 MED ORDER — DEXAMETHASONE SODIUM PHOSPHATE 10 MG/ML IJ SOLN
INTRAMUSCULAR | Status: DC | PRN
  Administered 2019-05-12: 10 mg via INTRAVENOUS

## 2019-05-12 MED ORDER — FENTANYL CITRATE (PF) 250 MCG/5ML IJ SOLN
INTRAMUSCULAR | Status: AC
  Filled 2019-05-12: qty 5

## 2019-05-12 MED ORDER — IBUPROFEN 100 MG/5ML PO SUSP
200.0000 mg | Freq: Four times a day (QID) | ORAL | Status: DC | PRN
  Filled 2019-05-12: qty 20

## 2019-05-12 MED ORDER — ROCURONIUM BROMIDE 10 MG/ML (PF) SYRINGE
PREFILLED_SYRINGE | INTRAVENOUS | Status: AC
  Filled 2019-05-12: qty 10

## 2019-05-12 MED ORDER — LIDOCAINE 2% (20 MG/ML) 5 ML SYRINGE
INTRAMUSCULAR | Status: AC
  Filled 2019-05-12: qty 5

## 2019-05-12 MED ORDER — PROPOFOL 10 MG/ML IV BOLUS
INTRAVENOUS | Status: AC
  Filled 2019-05-12: qty 40

## 2019-05-12 MED ORDER — ONDANSETRON HCL 4 MG/2ML IJ SOLN
INTRAMUSCULAR | Status: DC | PRN
  Administered 2019-05-12: 4 mg via INTRAVENOUS

## 2019-05-12 MED ORDER — BUPIVACAINE HCL (PF) 0.25 % IJ SOLN
INTRAMUSCULAR | Status: AC
  Filled 2019-05-12: qty 30

## 2019-05-12 MED ORDER — DIBUCAINE (PERIANAL) 1 % EX OINT
TOPICAL_OINTMENT | CUTANEOUS | Status: AC
  Filled 2019-05-12: qty 28

## 2019-05-12 MED ORDER — ONDANSETRON HCL 4 MG/2ML IJ SOLN
INTRAMUSCULAR | Status: AC
  Filled 2019-05-12: qty 2

## 2019-05-12 MED ORDER — SUCCINYLCHOLINE CHLORIDE 200 MG/10ML IV SOSY
PREFILLED_SYRINGE | INTRAVENOUS | Status: DC | PRN
  Administered 2019-05-12: 140 mg via INTRAVENOUS

## 2019-05-12 MED ORDER — MIDAZOLAM HCL 5 MG/5ML IJ SOLN
INTRAMUSCULAR | Status: DC | PRN
  Administered 2019-05-12: 2 mg via INTRAVENOUS

## 2019-05-12 MED ORDER — MIDAZOLAM HCL 2 MG/2ML IJ SOLN
INTRAMUSCULAR | Status: AC
  Filled 2019-05-12: qty 2

## 2019-05-12 MED ORDER — BUPIVACAINE HCL (PF) 0.25 % IJ SOLN
INTRAMUSCULAR | Status: DC | PRN
  Administered 2019-05-12: 20 mL

## 2019-05-12 MED ORDER — DEXAMETHASONE SODIUM PHOSPHATE 10 MG/ML IJ SOLN
INTRAMUSCULAR | Status: AC
  Filled 2019-05-12: qty 1

## 2019-05-12 SURGICAL SUPPLY — 41 items
APL SKNCLS STERI-STRIP NONHPOA (GAUZE/BANDAGES/DRESSINGS) ×2
BENZOIN TINCTURE PRP APPL 2/3 (GAUZE/BANDAGES/DRESSINGS) ×4 IMPLANT
BLADE SURG 15 STRL LF DISP TIS (BLADE) ×1 IMPLANT
BLADE SURG 15 STRL SS (BLADE) ×4
BRIEF STRETCH FOR OB PAD LRG (UNDERPADS AND DIAPERS) ×4 IMPLANT
CONT SPEC 4OZ CLIKSEAL STRL BL (MISCELLANEOUS) ×1 IMPLANT
COVER SURGICAL LIGHT HANDLE (MISCELLANEOUS) ×4 IMPLANT
COVER WAND RF STERILE (DRAPES) IMPLANT
DECANTER SPIKE VIAL GLASS SM (MISCELLANEOUS) ×4 IMPLANT
DRAPE LAPAROTOMY T 102X78X121 (DRAPES) ×4 IMPLANT
DRSG PAD ABDOMINAL 8X10 ST (GAUZE/BANDAGES/DRESSINGS) ×3 IMPLANT
ELECT PENCIL ROCKER SW 15FT (MISCELLANEOUS) ×4 IMPLANT
ELECT REM PT RETURN 15FT ADLT (MISCELLANEOUS) ×4 IMPLANT
GAUZE 4X4 16PLY RFD (DISPOSABLE) ×4 IMPLANT
GAUZE SPONGE 4X4 12PLY STRL (GAUZE/BANDAGES/DRESSINGS) ×3 IMPLANT
GLOVE BIO SURGEON STRL SZ 6.5 (GLOVE) ×2 IMPLANT
GLOVE BIO SURGEON STRL SZ7 (GLOVE) ×3 IMPLANT
GLOVE BIO SURGEONS STRL SZ 6.5 (GLOVE) ×1
GLOVE BIOGEL PI IND STRL 6.5 (GLOVE) ×1 IMPLANT
GLOVE BIOGEL PI INDICATOR 6.5 (GLOVE) ×2
GLOVE ECLIPSE 8.0 STRL XLNG CF (GLOVE) ×4 IMPLANT
GLOVE INDICATOR 8.0 STRL GRN (GLOVE) ×4 IMPLANT
GLOVE SURG SS PI 7.0 STRL IVOR (GLOVE) ×3 IMPLANT
GOWN STRL REUS W/TWL XL LVL3 (GOWN DISPOSABLE) ×11 IMPLANT
KIT BASIN OR (CUSTOM PROCEDURE TRAY) ×4 IMPLANT
KIT TURNOVER KIT A (KITS) IMPLANT
LOOP VESSEL MAXI BLUE (MISCELLANEOUS) IMPLANT
NEEDLE HYPO 22GX1.5 SAFETY (NEEDLE) ×4 IMPLANT
PACK BASIC VI WITH GOWN DISP (CUSTOM PROCEDURE TRAY) ×4 IMPLANT
SHEARS HARMONIC 9CM CVD (BLADE) IMPLANT
SURGILUBE 2OZ TUBE FLIPTOP (MISCELLANEOUS) ×4 IMPLANT
SUT CHROMIC 2 0 SH (SUTURE) ×4 IMPLANT
SUT CHROMIC 3 0 SH 27 (SUTURE) IMPLANT
SUT VIC AB 2-0 SH 27 (SUTURE)
SUT VIC AB 2-0 SH 27X BRD (SUTURE) IMPLANT
SUT VIC AB 2-0 UR6 27 (SUTURE) ×24 IMPLANT
SYR 20ML LL LF (SYRINGE) ×4 IMPLANT
SYR 3ML LL SCALE MARK (SYRINGE) IMPLANT
TOWEL OR 17X26 10 PK STRL BLUE (TOWEL DISPOSABLE) ×4 IMPLANT
TOWEL OR NON WOVEN STRL DISP B (DISPOSABLE) ×4 IMPLANT
YANKAUER SUCT BULB TIP 10FT TU (MISCELLANEOUS) ×4 IMPLANT

## 2019-05-12 NOTE — Discharge Instructions (Signed)
ANORECTAL SURGERY:  POST OPERATIVE INSTRUCTIONS  ######################################################################  EAT Start with a pureed / full liquid diet After 24 hours, gradually transition to a high fiber diet.    CONTROL PAIN Control pain so you can tolerate bowel movements,  walk, sleep, tolerate sneezing/coughing, and go up/down stairs.   HAVE A BOWEL MOVEMENT DAILY Keep your bowels regular to avoid problems.   Taking a fiber supplement every day to keep bowels soft.   Try a laxative to override constipation. Use an antidairrheal to slow down diarrhea.   Call if not better after 2 tries  WALK Walk an hour a day.  Control your pain to do that.   CALL IF YOU HAVE PROBLEMS/CONCERNS Call if you are still struggling despite following these instructions. Call if you have concerns not answered by these instructions  ######################################################################    1. Take your usually prescribed home medications unless otherwise directed.  2. DIET: Follow a light bland diet & liquids the first 24 hours after arrival home, such as soup, liquids, starches, etc.  Be sure to drink plenty of fluids.  Quickly advance to a usual solid diet within a few days.  Avoid fast food or heavy meals as your are more likely to get nauseated or have irregular bowels.  A low-fat, high-fiber diet for the rest of your life is ideal.  3. PAIN CONTROL: a. Pain is best controlled by a usual combination of three different methods TOGETHER: i. Ice/Heat ii. Over the counter pain medication iii. Prescription pain medication b. Expect swelling and discomfort in the anus/rectal area.  Warm water baths (30-60 minutes up to 6 times a day, especially after bowel meovements) will help. Use ice for the first few days to help decrease swelling and bruising, then switch to heat such as warm towels, sitz baths, warm baths, etc to help relax tight/sore spots and speed recovery.   Some people prefer to use ice alone, heat alone, alternating between ice & heat.  Experiment to what works for you.   c. It is helpful to take an over-the-counter pain medication continuously for the first few weeks.  Choose one of the following that works best for you: i. Naproxen (Aleve, etc)  Two 235m tabs twice a day ii. Ibuprofen (Advil, etc) Three 2031mtabs four times a day (every meal & bedtime) iii. Acetaminophen (Tylenol, etc) 500-65083mour times a day (every meal & bedtime) d. A  prescription for pain medication (such as oxycodone, hydrocodone, etc) should be given to you upon discharge.  Take your pain medication as prescribed.  i. If you are having problems/concerns with the prescription medicine (does not control pain, nausea, vomiting, rash, itching, etc), please call us [phone] see if we need to switch you to a different pain medicine that will work better for you and/or control your side effect better. ii. If you need a refill on your pain medication, please contact your pharmacy.  They will contact our office to request authorization. Prescriptions will not be filled after 5 pm or on week-ends.  If can take up to 48 hours for it to be filled & ready so avoid waiting until you are down to thel ast pill. e. A topical cream (Dibucaine) or a prescription for a cream (such as diltiazem 2% gel) may be given to you.  Many people find relief with topical creams.  Some people find it burns too much.  Experiment.  If it helps, use it.  If it burns, don't using  it.  Use a Sitz Bath 4-8 times a day for relief   [employer] A sitz bath is a warm water bath taken in the sitting position that covers only the hips and buttocks. It may be used for either healing or hygiene purposes. Sitz baths are also used to relieve pain, itching, or muscle spasms. The water may contain medicine. Moist heat will help you heal and relax.  HOME CARE INSTRUCTIONS  Take 3 to 4 sitz baths a day. 1. Fill the  bathtub half full with warm water. 2. Sit in the water and open the drain a little. 3. Turn on the warm water to keep the tub half full. Keep the water running constantly. 4. Soak in the water for 15 to 20 minutes. 5. After the sitz bath, pat the affected area dry first.   4. KEEP YOUR BOWELS REGULAR a. The goal is one soft bowel movement a day b. Avoid getting constipated.  Between the surgery and the pain medications, it is common to experience some constipation.  Increasing fluid intake and taking a fiber supplement (such as Metamucil, Citrucel, FiberCon, MiraLax, etc) 2-3 times a day regularly will usually help prevent this problem from occurring.  A mild laxative (prune juice, Milk of Magnesia, MiraLax, etc) should be taken according to package directions if there are no bowel movements after 48 hours. c. Watch out for diarrhea.  If you have many loose bowel movements, simplify your diet to bland foods & liquids for a few days.  Stop any stool softeners and decrease your fiber supplement.  Switching to mild anti-diarrheal medications (Kayopectate, Pepto Bismol) can help.  Can try an imodium/loperamide dose.  If this worsens or does not improve, please call us.  5. Wound Care  a. Remove your bandages with your first bowel movement, usually the day after surgery.  You may have packing if you had an abscess.  Let any packing or gauze fall come out.   b. Wear an absorbent pad or soft cotton balls in your underwear as needed to catch any drainage and help keep the area  c. Keep the area clean and dry.  Bathe / shower every day.  Keep the area clean by showering / bathing over the incision / wound.   It is okay to soak an open wound to help wash it.  Consider using a squeeze bottle filled with warm water to gently wash the anal area.  Wet wipes or showers / gentle washing after bowel movements is often less traumatic than regular toilet paper. d. Bonita Quin will often notice bleeding with bowel movements.   This should slow down by the end of the first week of surgery.  Sitting on an ice pack can help. e. Expect some drainage.  This should slow down by the end of the first two weeks of surgery, but you will have occasional bleeding or drainage up to a few months after surgery.  Wear an absorbent pad or soft cotton gauze in your underwear until the drainage stops.  6. ACTIVITIES as tolerated:   a. You may resume regular (light) daily activities beginning the next day--such as daily self-care, walking, climbing stairs--gradually increasing activities as tolerated.  If you can walk 30 minutes without difficulty, it is safe to try more intense activity such as jogging, treadmill, bicycling, low-impact aerobics, swimming, etc. b. Save the most intensive and strenuous activity for last such as sit-ups, heavy lifting, contact sports, etc  Refrain from any heavy lifting or  straining until you are off narcotics for pain control.   c. DO NOT PUSH THROUGH PAIN.  Let pain be your guide: If it hurts to do something, don't do it.  Pain is your body warning you to avoid that activity for another week until the pain goes down. d. You may drive when you are no longer taking prescription pain medication, you can comfortably sit for long periods of time, and you can safely maneuver your car and apply brakes. e. Dennis Bast may have sexual intercourse when it is comfortable.  7. FOLLOW UP in our office a. Please call [facility] at [phone] to set up an appointment to see your surgeon in the office for a follow-up appointment approximately 2-3 weeks after your surgery. b. Make sure that you call for this appointment the day you arrive home to ensure a convenient appointment time.  8. IF YOU HAVE DISABILITY OR FAMILY LEAVE FORMS, BRING THEM TO THE OFFICE FOR PROCESSING.  DO NOT GIVE THEM TO YOUR DOCTOR.        WHEN TO CALL us [phone]: 1. Poor pain control 2. Reactions / problems with new medications (rash/itching,  nausea, etc)  3. Fever over 101.5 F (38.5 C) 4. Inability to urinate 5. Nausea and/or vomiting 6. Worsening swelling or bruising 7. Continued bleeding from incision. 8. Increased pain, redness, or drainage from the incision  The clinic staff is available to answer your questions during regular business hours (8:30am-5pm).  Please dont hesitate to call and ask to speak to one of our nurses for clinical concerns.   A surgeon from [facility] is always on call at the hospitals   If you have a medical emergency, go to the nearest emergency room or call 911.    [facility], [place], [place], [place], [place]  [address] ? MAIN: [phone] ? TOLL FREE: [phone] ? FAX [phone] [email or web address]  The anatomy & physiology of the anorectal region was discussed.  We discussed the pathophysiology of anorectal abscess and fistula.  Differential diagnosis was discussed.  Natural history progression was discussed.   I stressed the importance of a bowel regimen to have daily soft bowel movements to minimize progression of disease.     The patient's condition is not adequately controlled.  Non-operative treatment has not healed the fistula.  Therefore, I recommended examination under anaesthesia to confirm the diagnosis and treat the fistula.  I discussed techniques that may be required such as fistulotomy, ligation by LIFT technique, and/or seton placement.  Benefits & alternatives discussed.  I noted a good likelihood this will help address the problem, but sometimes repeat operations and prolonged healing times may occur.  Risks such as bleeding, pain, recurrence, reoperation, injury to other organs, need for repair of tissues / organs reoperation, incontinence, heart attack, death, and other risks were discussed.      Educational handouts further explaining the pathology, treatment options, and bowel regimen were given.  The patient  expressed understanding & wishes to proceed.  We will work to coordinate surgery for a mutually convenient time.   STOP SMOKING!  We strongly recommend that you stop smoking.  Smoking increases the risk of surgery including infection in the form of an open wound, pus formation, abscess, hernia at an incision on the abdomen, etc.  You have an increased risk of other MAJOR complications such as stroke, heart attack, forming clots in the leg and/or lungs, and death.    Smoking  Cessation Quitting smoking is important to your health and has many advantages. However, it is not always easy to quit since nicotine is a very addictive drug. Often times, people try 3 times or more before being able to quit. This document explains the best ways for you to prepare to quit smoking. Quitting takes hard work and a lot of effort, but you can do it. ADVANTAGES OF QUITTING SMOKING  You will live longer, feel better, and live better.  Your body will feel the impact of quitting smoking almost immediately.  Within 20 minutes, blood pressure decreases. Your pulse returns to its normal level.  After 8 hours, carbon monoxide levels in the blood return to normal. Your oxygen level increases.  After 24 hours, the chance of having a heart attack starts to decrease. Your breath, hair, and body stop smelling like smoke.  After 48 hours, damaged nerve endings begin to recover. Your sense of taste and smell improve.  After 72 hours, the body is virtually free of nicotine. Your bronchial tubes relax and breathing becomes easier.  After 2 to 12 weeks, lungs can hold more air. Exercise becomes easier and circulation improves.  The risk of having a heart attack, stroke, cancer, or lung disease is greatly reduced.  After 1 year, the risk of coronary heart disease is cut in half.  After 5 years, the risk of stroke falls to the same as a nonsmoker.  After 10 years, the risk of lung cancer is cut in half and the risk of  other cancers decreases significantly.  After 15 years, the risk of coronary heart disease drops, usually to the level of a nonsmoker.  If you are pregnant, quitting smoking will improve your chances of having a healthy baby.  The people you live with, especially any children, will be healthier.  You will have extra money to spend on things other than cigarettes. QUESTIONS TO THINK ABOUT BEFORE ATTEMPTING TO QUIT You may want to talk about your answers with your caregiver.  Why do you want to quit?  If you tried to quit in the past, what helped and what did not?  What will be the most difficult situations for you after you quit? How will you plan to handle them?  Who can help you through the tough times? Your family? Friends? A caregiver?  What pleasures do you get from smoking? What ways can you still get pleasure if you quit? Here are some questions to ask your caregiver:  How can you help me to be successful at quitting?  What medicine do you think would be best for me and how should I take it?  What should I do if I need more help?  What is smoking withdrawal like? How can I get information on withdrawal? GET READY  Set a quit date.  Change your environment by getting rid of all cigarettes, ashtrays, matches, and lighters in your home, car, or work. Do not let people smoke in your home.  Review your past attempts to quit. Think about what worked and what did not. GET SUPPORT AND ENCOURAGEMENT You have a better chance of being successful if you have help. You can get support in many ways.  Tell your family, friends, and co-workers that you are going to quit and need their support. Ask them not to smoke around you.  Get individual, group, or telephone counseling and support. Programs are available at [facility]. Call your local health department for information  about programs in your area.  Spiritual beliefs and practices may help some smokers  quit.  Download a "quit meter" on your computer to keep track of quit statistics, such as how long you have gone without smoking, cigarettes not smoked, and money saved.  Get a self-help book about quitting smoking and staying off of tobacco. LEARN NEW SKILLS AND BEHAVIORS  Distract yourself from urges to smoke. Talk to someone, go for a walk, or occupy your time with a task.  Change your normal routine. Take a different route to work. Drink tea instead of coffee. Eat breakfast in a different place.  Reduce your stress. Take a hot bath, exercise, or read a book.  Plan something enjoyable to do every day. Reward yourself for not smoking.  Explore interactive web-based programs that specialize in helping you quit. GET MEDICINE AND USE IT CORRECTLY Medicines can help you stop smoking and decrease the urge to smoke. Combining medicine with the above behavioral methods and support can greatly increase your chances of successfully quitting smoking.  Nicotine replacement therapy helps deliver nicotine to your body without the negative effects and risks of smoking. Nicotine replacement therapy includes nicotine gum, lozenges, inhalers, nasal sprays, and skin patches. Some may be available over-the-counter and others require a prescription.  Antidepressant medicine helps people abstain from smoking, but how this works is unknown. This medicine is available by prescription.  Nicotinic receptor partial agonist medicine simulates the effect of nicotine in your brain. This medicine is available by prescription. Ask your caregiver for advice about which medicines to use and how to use them based on your health history. Your caregiver will tell you what side effects to look out for if you choose to be on a medicine or therapy. Carefully read the information on the package. Do not use any other product containing nicotine while using a nicotine replacement product.  RELAPSE OR DIFFICULT SITUATIONS Most  relapses occur within the first 3 months after quitting. Do not be discouraged if you start smoking again. Remember, most people try several times before finally quitting. You may have symptoms of withdrawal because your body is used to nicotine. You may crave cigarettes, be irritable, feel very hungry, cough often, get headaches, or have difficulty concentrating. The withdrawal symptoms are only temporary. They are strongest when you first quit, but they will go away within 10 14 days. To reduce the chances of relapse, try to:  Avoid drinking alcohol. Drinking lowers your chances of successfully quitting.  Reduce the amount of caffeine you consume. Once you quit smoking, the amount of caffeine in your body increases and can give you symptoms, such as a rapid heartbeat, sweating, and anxiety.  Avoid smokers because they can make you want to smoke.  Do not let weight gain distract you. Many smokers will gain weight when they quit, usually less than 10 pounds. Eat a healthy diet and stay active. You can always lose the weight gained after you quit.  Find ways to improve your mood other than smoking. FOR MORE INFORMATION  www.smokefree.gov    While it can be one of the most difficult things to do, the [place] community has programs to help you stop.  Consider talking with your primary care physician about options.  Also, Smoking Cessation classes are available through the [facility]:  The smoking cessation program is a proven-effective program from the American Lung Association. The program is available for anyone 16 and older who currently smokes. The program lasts for  7 weeks and is 8 sessions. Each class will be approximately 1 1/2 hours. The program is every Tuesday.  All classes are 12-1:30pm and same location.  Event Location Information:  Location: [facility] 2nd Floor Conference Room 2-037; located next to [facility]  Closest cross streets: [address] &  [address] Entrance into the [facility] is adjacent to the [facility]'s main entrance. The conference room is located on the 2nd floor.  Parking Instructions: Visitor parking is adjacent to [facility]'s main entrance and the Cancer Center    A smoking cessation program is also offered through the [facility]. Register online at [email or web address] or call [phone] for more information.   Tobacco cessation counseling is available at [facility]. Call [phone] for a free appointment.   Tobacco cessation classes also are available through the [facility] in [place]. For information, call [phone].   The Patient Education Network features videos on tobacco cessation. Please consult your listings in the center of this book to find instructions on how to access this resource.   If you want more information, ask your nurse.

## 2019-05-12 NOTE — Anesthesia Postprocedure Evaluation (Signed)
Anesthesia Post Note  Patient: Luke Wilkerson  Procedure(s) Performed: ANORECTAL EXAM UNDER ANESTHESIA WITH LIFT REPAIR (N/A Rectum)     Patient location during evaluation: PACU Anesthesia Type: General Level of consciousness: awake Pain management: pain level controlled Respiratory status: spontaneous breathing Cardiovascular status: stable Postop Assessment: no apparent nausea or vomiting Anesthetic complications: no    Last Vitals:  Vitals:   05/12/19 0945 05/12/19 1000  BP: (!) 132/94 (!) 143/92  Pulse: 79 76  Resp: 18 16  Temp: 36.6 C 36.6 C  SpO2: 98% 99%    Last Pain:  Vitals:   05/12/19 1000  TempSrc:   PainSc: 0-No pain   Pain Goal:                   Huston Foley

## 2019-05-12 NOTE — Progress Notes (Addendum)
Pt states he has a sore on inner right groin area, he called Dr.Gross's office about it on Tuesday, they stated Dr.Gross would call the patient on Wednesday, but patient never heard anything.  He states he had some cipro at home, so he started it on Sunday (05/04/19), has been using about three times a day.  He isn't sure of the dose.   Area was noted by nurse, pencil eraser size area, not open, no drainage, no redness or swelling noted.

## 2019-05-12 NOTE — Interval H&P Note (Signed)
History and Physical Interval Note:  05/12/2019 7:17 AM  Luke Wilkerson  has presented today for surgery, with the diagnosis of PERIRRECTAL FISTULA.  The various methods of treatment have been discussed with the patient and family. After consideration of risks, benefits and other options for treatment, the patient has consented to  Procedure(s): ANORECTAL EXAM UNDER ANESTHESIA WITH POSSIBLE HEMORRHOIDECTOMY (N/A) REPAIR OF PERIRECTAL FISTULA (N/A) as a surgical intervention.  The patient's history has been reviewed, patient examined, no change in status, stable for surgery.  I have reviewed the patient's chart and labs.  Questions were answered to the patient's satisfaction.    The anatomy & physiology of the anorectal region was discussed.  We discussed the pathophysiology of anorectal abscess and fistula.  Differential diagnosis was discussed.  Natural history progression was discussed.   I stressed the importance of a bowel regimen to have daily soft bowel movements to minimize progression of disease.     The patient's condition is not adequately controlled.  Non-operative treatment has not healed the fistula.  Therefore, I recommended examination under anaesthesia to confirm the diagnosis and treat the fistula.  I discussed techniques that may be required such as fistulotomy, ligation by LIFT technique, and/or seton placement.  Benefits & alternatives discussed.  I noted a good likelihood this will help address the problem, but sometimes repeat operations and prolonged healing times may occur.  Risks such as bleeding, pain, recurrence, reoperation, injury to other organs, need for repair of tissues / organs reoperation, incontinence, heart attack, death, and other risks were discussed.      Educational handouts further explaining the pathology, treatment options, and bowel regimen were given.  The patient expressed understanding & wishes to proceed.  We will work to coordinate surgery for a mutually  convenient time.     Adin Hector

## 2019-05-12 NOTE — Transfer of Care (Signed)
Immediate Anesthesia Transfer of Care Note  Patient: Philander Ake  Procedure(s) Performed: ANORECTAL EXAM UNDER ANESTHESIA WITH LIFT REPAIR (N/A Rectum)  Patient Location: PACU  Anesthesia Type:General  Level of Consciousness: awake, alert  and oriented  Airway & Oxygen Therapy: Patient Spontanous Breathing and Patient connected to face mask oxygen  Post-op Assessment: Report given to RN and Post -op Vital signs reviewed and stable  Post vital signs: Reviewed and stable  Last Vitals:  Vitals Value Taken Time  BP    Temp    Pulse 78 05/12/19 0919  Resp 18 05/12/19 0919  SpO2 100 % 05/12/19 0919  Vitals shown include unvalidated device data.  Last Pain:  Vitals:   05/12/19 0636  TempSrc:   PainSc: 0-No pain         Complications: No apparent anesthesia complications

## 2019-05-12 NOTE — Interval H&P Note (Signed)
History and Physical Interval Note:  05/12/2019 7:10 AM  Luke Wilkerson  has presented today for surgery, with the diagnosis of PERIRRECTAL FISTULA.  The various methods of treatment have been discussed with the patient and family. After consideration of risks, benefits and other options for treatment, the patient has consented to  Procedure(s): ANORECTAL EXAM UNDER ANESTHESIA WITH POSSIBLE HEMORRHOIDECTOMY (N/A) REPAIR OF PERIRECTAL FISTULA (N/A) as a surgical intervention.  The patient's history has been reviewed, patient examined, no change in status, stable for surgery.  I have reviewed the patient's chart and labs.  Questions were answered to the patient's satisfaction.    I have re-reviewed the the patient's records, history, medications, and allergies.  I have re-examined the patient.   The anatomy & physiology of the anorectal region was discussed.  The pathophysiology of hemorrhoids and differential diagnosis was discussed.  Natural history risks without surgery was discussed.   I stressed the importance of a bowel regimen to have daily soft bowel movements to minimize progression of disease.  Interventions such as sclerotherapy & banding were discussed.  The patient's symptoms are not adequately controlled by medicines and other non-operative treatments.  I feel the risks & problems of no surgery outweigh the operative risks; therefore, I recommended surgery to treat the hemorrhoids by ligation, pexy, and possible resection.  Risks such as bleeding, infection, urinary difficulties, injury to other organs, need for repair of tissues / organs, need for further treatment, heart attack, death, and other risks were discussed.   I noted a good likelihood this will help address the problem.  Goals of post-operative recovery were discussed as well.  Possibility that this will not correct all symptoms was explained.  Post-operative pain, bleeding, constipation, and other problems after surgery were  discussed.  We will work to minimize complications.   Educational handouts further explaining the pathology, treatment options, and bowel regimen were given as well.  Questions were answered.  The patient expresses understanding & wishes to proceed with surgery.   I again discussed intraoperative plans and goals of post-operative recovery.  The patient agrees to proceed.  Luke Wilkerson  09-Jul-1986 [id number]  Patient Care Team: Riki Rusk as PCP - General (Physician Assistant) Karie Soda, MD as Consulting Physician (General Surgery)  Patient Active Problem List   Diagnosis Date Noted  . Hypertriglyceridemia 04/10/2019  . Perianal abscess 06/21/2017  . NAFL (nonalcoholic fatty liver) 05/25/2017  . Elevated ALT measurement 05/11/2017  . RUQ abdominal pain 05/10/2017  . Hypertension goal BP (blood pressure) < 130/80 04/09/2017  . Alcohol consumption of more than two drinks per day 03/12/2017  . Adult ADHD 03/12/2017  . Elevated blood pressure reading 03/12/2017  . Moderate episode of recurrent major depressive disorder (HCC) 03/12/2017  . Social anxiety disorder 03/12/2017  . Snoring 03/12/2017  . At risk for obstructive sleep apnea 03/12/2017  . Tobacco use 03/12/2017  . Back pain 02/08/2014    Past Medical History:  Diagnosis Date  . ADHD   . Anxiety   . Elevated ALT measurement 05/11/2017   AST:ALT 0.5  . Hypertension   . Hypertriglyceridemia 04/10/2019  . Obesity   . Pneumonia 2016    Past Surgical History:  Procedure Laterality Date  . FRACTURE SURGERY Right 2008   Rt knee  . FRACTURE SURGERY Right 2016   Rt wrist from old injury  . LEG SURGERY    . WRIST ARTHROSCOPY      Social History   Socioeconomic History  .  Marital status: Single    Spouse name: Not on file  . Number of children: Not on file  . Years of education: Not on file  . Highest education level: Not on file  Occupational History  . Not on file  Social Needs  .  Financial resource strain: Not on file  . Food insecurity    Worry: Not on file    Inability: Not on file  . Transportation needs    Medical: Not on file    Non-medical: Not on file  Tobacco Use  . Smoking status: Current Every Day Smoker    Packs/day: 1.00    Years: 15.00    Pack years: 15.00    Types: Cigarettes  . Smokeless tobacco: Never Used  Substance and Sexual Activity  . Alcohol use: Yes    Alcohol/week: 7.0 standard drinks    Types: 7 Cans of beer per week    Comment: sometimes 6 in a day  . Drug use: No  . Sexual activity: Yes  Lifestyle  . Physical activity    Days per week: Not on file    Minutes per session: Not on file  . Stress: Not on file  Relationships  . Social Musicianconnections    Talks on phone: Not on file    Gets together: Not on file    Attends religious service: Not on file    Active member of club or organization: Not on file    Attends meetings of clubs or organizations: Not on file    Relationship status: Not on file  . Intimate partner violence    Fear of current or ex partner: Not on file    Emotionally abused: Not on file    Physically abused: Not on file    Forced sexual activity: Not on file  Other Topics Concern  . Not on file  Social History Narrative  . Not on file    Family History  Problem Relation Age of Onset  . Hypertension Mother   . Hyperlipidemia Mother   . Diabetes Father     Medications Prior to Admission  Medication Sig Dispense Refill Last Dose  . amLODipine (NORVASC) 10 MG tablet Take 1 tablet (10 mg total) by mouth daily. 90 tablet 0 05/12/2019 at 0545  . amphetamine-dextroamphetamine (ADDERALL XR) 10 MG 24 hr capsule Take 1 capsule (10 mg total) by mouth daily. (Patient not taking: Reported on 05/02/2019) 30 capsule 0 Not Taking at Unknown time    Current Facility-Administered Medications  Medication Dose Route Frequency Provider Last Rate Last Dose  . bupivacaine liposome (EXPAREL) 1.3 % injection 266 mg  20 mL  Infiltration On Call to OR Karie SodaGross, Jarell Mcewen, MD      . cefTRIAXone (ROCEPHIN) 2 g in sodium chloride 0.9 % 100 mL IVPB  2 g Intravenous On Call to OR Karie SodaGross, Talbert Trembath, MD       And  . metroNIDAZOLE (FLAGYL) IVPB 500 mg  500 mg Intravenous On Call to OR Karie SodaGross, Mitsuye Schrodt, MD      . Chlorhexidine Gluconate Cloth 2 % PADS 6 each  6 each Topical Once Karie SodaGross, Ardice Boyan, MD       And  . Chlorhexidine Gluconate Cloth 2 % PADS 6 each  6 each Topical Once Karie SodaGross, Tylene Quashie, MD      . lactated ringers infusion   Intravenous Continuous Karie SodaGross, Shalom Mcguiness, MD 10 mL/hr at 05/12/19 0643       No Known Allergies  BP (!) 146/92   Pulse  72   Temp 97.7 F (36.5 C) (Oral)   Resp 18   Ht 5\' 11"  (1.803 m)   Wt 106.6 kg   SpO2 98%   BMI 32.78 kg/m   Labs: No results found for this or any previous visit (from the past 48 hour(s)).  Imaging / Studies: No results found.   Adin Hector, M.D., F.A.C.S. Gastrointestinal and Minimally Invasive Surgery [facility] [address] [place], [place] [address] [phone] Main / Paging  05/12/2019 7:10 AM    Adin Hector

## 2019-05-12 NOTE — Anesthesia Procedure Notes (Signed)
Procedure Name: Intubation Date/Time: 05/12/2019 7:41 AM Performed by: Talbot Grumbling, CRNA Pre-anesthesia Checklist: Emergency Drugs available, Patient being monitored, Suction available and Patient identified Patient Re-evaluated:Patient Re-evaluated prior to induction Oxygen Delivery Method: Circle system utilized Preoxygenation: Pre-oxygenation with 100% oxygen Induction Type: IV induction Ventilation: Mask ventilation without difficulty Laryngoscope Size: Mac and 3 Grade View: Grade II Tube type: Oral Tube size: 7.5 mm Number of attempts: 1 Airway Equipment and Method: Stylet Placement Confirmation: ETT inserted through vocal cords under direct vision,  positive ETCO2 and breath sounds checked- equal and bilateral Secured at: 23 cm Tube secured with: Tape Dental Injury: Teeth and Oropharynx as per pre-operative assessment

## 2019-05-12 NOTE — Op Note (Signed)
05/12/2019  9:15 AM  PATIENT:  Luke Wilkerson  33 y.o. male  Patient Care Team: Riki Ruskummings, Charley Elizabeth, PA-C as PCP - General (Physician Assistant) Karie SodaGross, Chesni Vos, MD as Consulting Physician (General Surgery)  PRE-OPERATIVE DIAGNOSIS:  PERIRRECTAL FISTULA  POST-OPERATIVE DIAGNOSIS:  INTERSPHINCTERIC FISTULA  PROCEDURE:   ANORECTAL EXAM UNDER ANESTHESIA LIFT REPAIR (Ligation of Intersphincteric Tract)  SURGEON:  Ardeth SportsmanSteven C. Gwendelyn Lanting, MD  ASSISTANT: Zandra AbtsAshley Allmer, PA-S, [employer]   ANESTHESIA:   General Anorectal & Local field block (0.25% bupivacaine with epinephrine mixed with Liposomal bupivacaine (Experel)    EBL:  Total I/O In: 800 [I.V.:800] Out: 20 [Blood:20].  See anesthesia record  Delay start of Pharmacological VTE agent (>24hrs) due to surgical blood loss or risk of bleeding:  no  DRAINS: none   SPECIMEN: External fistulous tract.  Internal opening  DISPOSITION OF SPECIMEN:  PATHOLOGY  COUNTS:  YES  PLAN OF CARE: Discharge to home after PACU  PATIENT DISPOSITION:  PACU - hemodynamically stable.  INDICATION: Patient with probable perirectal fistula.  I recommended examination and surgical treatment:  The anatomy & physiology of the anorectal region was discussed.  We discussed the pathophysiology of anorectal abscess and fistula.  Differential diagnosis was discussed.  Natural history progression was discussed.   I stressed the importance of a bowel regimen to have daily soft bowel movements to minimize progression of disease.     The patient's condition is not adequately controlled.  Non-operative treatment has not healed the fistula.  Therefore, I recommended examination under anaesthesia to confirm the diagnosis and treat the fistula.  I discussed techniques that may be required such as fistulotomy, ligation by LIFT technique, and/or seton placement.  Benefits & alternatives discussed.  I noted a good likelihood this will help address the problem,  but sometimes repeat operations and prolonged healing times may occur.  Risks such as bleeding, pain, recurrence, reoperation, incontinence, heart attack, death, and other risks were discussed.      STOP SMOKING! We talked to the patient about the dangers of smoking.  We stressed that tobacco use dramatically increases the risk of peri-operative complications such as infection, tissue necrosis leaving to problems with incision/wound and organ healing, hernia, chronic pain, heart attack, stroke, DVT, pulmonary embolism, and death.  We noted there are programs in our community to help stop smoking.  Information was available.  Educational handouts further explaining the pathology, treatment options, and bowel regimen were given.  The patient expressed understanding & wishes to proceed.  We will work to coordinate surgery for a mutually convenient time.   OR FINDINGS: Patient had an intersphincteric fistula.    External location LEFT LATERAL   about 3.5 cm from anal verge.  Internal location : Left posteriolateral anal crypt at anal crypt about 1 cm from anal verge.  DESCRIPTION:   Informed consent was confirmed. Patient underwent general anesthesia without difficulty. Patient was placed into prone positioning.  The perianal region was prepped and draped in sterile fashion. Surgical timeout confirmed or plan.  I did digital rectal examination and then transitioned over to anoscopy to get a sense of the anatomy.  I did place a probe through the external opening.  Rather scarred so I cannot get a good probe through the tract.  Again to circumferentially excise the external opening came around through the dermis and subcutaneous tissues.  Without I could better straighten out the tract.  I did sharply cut through the fistulous tract a little more proximally to confirm  an opening that was outlined in methylene blue.  Placed a probe and it went up about a centimeter but would not tract.  Tension on it was  pulling on a left posterior lateral anal crypt that was suspicious for the internal opening.  I could only partial probe through the anal crypt opening with a Shepherds crook probe.  Could not confirm that the tract was intersphincteric.  No abscess located.  I went ahead and proceeded with the LIFT technique.  I began to excise the external opening with a radial biconcave incision around it.  I transitioned to cautery and help free the fistulous tract circumferentially all way down towards the sphincter component.    I made a transverse incision at the anal squamocolumnar junction .  Did careful dissection to get down to the sphincter complex.  I carefully went between the internal and external sphincter using careful blunt dissection parallel to the fibers.  I was able to locate the intersphincteric component of the fistulous tract clearly outlined in methylene blue..  I was able to get around it gently with a right angle clamp.  I carefully skeletonized the intersphincteric component.I placed 2-0 Vicryl stitches through the intersphincteric tract on the proximal side and on the distal side in between the external & internal sphincters.  I transected the intersphincteric segment of the fistulous tract.   I ligated the stumps of the transected segments with 2-0 Vicryl again with a figure-of-eight stitch in a 90 degree fashion to doubly ligate and prove that the tract had been closed.   I removed the superficial external end of the fistulous tract & ligated the base of the external wound just outside the sphincter component with 2-0 vicryl.  I then transitioned to the rectal component.  Did a figure-of-eight stitch of 2-0 Vicryl suture several centimeters proximal to the internal opening in the rectum along a hemorrhoidal:.  I ran that longitudinally until it came to the opening that that is left to left a posterior lateral.  I transected the rectal tissue anorectal component anal crypt polyps nearby and a  longitudinal fusiform fashion until I had healthier tissue.  I then ran the 2-0 Vicryl stitch down to the anal verge to also help cover up the intersphincteric ligation wound as well.  I tied that running suture down, thus closing the internal opening.  Close the anal canal opening with interrupted 2-0 chromic sutures transversely,  protecting the LIFT repair.  Hemostasis was excellent.  I reexamined the anal canal.   There is was no narrowing.  Hemorrhoid piles with grade 1 decided to actually repair so small sutures are treatment was needed.  Excised lesion in the external canal opening dermis and skin to have a more broad flattening opening.  Hemostasis was good.  We placed fluff gauze to onlay over the wounds.  No packing done.  Patient is being extubated go to recovery room.  I discussed postop care in detail with the patient holding area.  Instructions written.  eRx sent.  I made an attempt to locate family to discuss patient's status and recommendations.  No answer calling his mother's number twice.  No one is available at this time.  We will try again later  Adin Hector, M.D., F.A.C.S. Gastrointestinal and Minimally Invasive Surgery [facility] [address] [place], [place] [address] [phone] Main / Paging

## 2019-05-13 ENCOUNTER — Encounter (HOSPITAL_COMMUNITY): Payer: Self-pay | Admitting: Surgery

## 2019-06-05 ENCOUNTER — Ambulatory Visit: Payer: BC Managed Care – PPO | Admitting: Osteopathic Medicine

## 2019-08-07 ENCOUNTER — Ambulatory Visit (INDEPENDENT_AMBULATORY_CARE_PROVIDER_SITE_OTHER): Payer: BC Managed Care – PPO | Admitting: Osteopathic Medicine

## 2019-08-07 ENCOUNTER — Encounter: Payer: Self-pay | Admitting: Osteopathic Medicine

## 2019-08-07 VITALS — Wt 226.0 lb

## 2019-08-07 DIAGNOSIS — F909 Attention-deficit hyperactivity disorder, unspecified type: Secondary | ICD-10-CM | POA: Diagnosis not present

## 2019-08-07 DIAGNOSIS — F411 Generalized anxiety disorder: Secondary | ICD-10-CM | POA: Diagnosis not present

## 2019-08-07 DIAGNOSIS — I10 Essential (primary) hypertension: Secondary | ICD-10-CM

## 2019-08-07 MED ORDER — BUPROPION HCL ER (XL) 150 MG PO TB24: 150.0000 mg | ORAL_TABLET | ORAL | 1 refills | Status: DC

## 2019-08-07 MED ORDER — AMPHETAMINE-DEXTROAMPHET ER 10 MG PO CP24: 10.0000 mg | ORAL_CAPSULE | Freq: Every day | ORAL | 0 refills | Status: DC

## 2019-08-07 MED ORDER — AMLODIPINE BESYLATE 10 MG PO TABS: 10.0000 mg | ORAL_TABLET | Freq: Every day | ORAL | 0 refills | Status: DC

## 2019-08-07 NOTE — Progress Notes (Signed)
Virtual Visit via Video (App used: Doximity) Note  I connected with      Luke Wilkerson on 08/07/19 at 3:05 PM  by a telemedicine application and verified that I am speaking with the correct person using two identifiers.  Patient is at home I am in office   I discussed the limitations of evaluation and management by telemedicine and the availability of in person appointments. The patient expressed understanding and agreed to proceed.  History of Present Illness: Luke Wilkerson is a 33 y.o. male who would like to discuss ADHD refills   Needs ADHD refills  Would like to discuss anxiety - few of his answers to the screening questions were concerning to him. He'd been on Lexapro and Zoloft and Buspar in the past without much relief of anxiety symptoms.   Has been successful with intentional weight loss, thinks he might want to try coming off the BP meds.       Depression screen [facility] 2/9 08/07/2019 04/24/2019 05/10/2017  Decreased Interest 3 0 3  Down, Depressed, Hopeless 1 0 2  PHQ - 2 Score 4 0 5  Altered sleeping 1 0 3  Tired, decreased energy 1 0 1  Change in appetite 0 0 3  Feeling bad or failure about yourself  0 0 0  Trouble concentrating 1 3 3   Moving slowly or fidgety/restless 0 0 1  Suicidal thoughts 0 0 0  PHQ-9 Score 7 3 16   Difficult doing work/chores Very difficult Somewhat difficult Somewhat difficult   GAD 7 : Generalized Anxiety Score 08/07/2019 04/24/2019 05/10/2017 03/13/2017  Nervous, Anxious, on Edge 3 3 1 3   Control/stop worrying 3 3 3 3   Worry too much - different things 3 3 3 3   Trouble relaxing 3 3 3 3   Restless 1 2 1 3   Easily annoyed or irritable 3 3 3 3   Afraid - awful might happen 3 2 3 3   Total GAD 7 Score 19 19 17 21   Anxiety Difficulty Very difficult Somewhat difficult Somewhat difficult -      Observations/Objective: Wt 226 lb (102.5 kg)   BMI 31.52 kg/m  137/75 BP Readings from Last 3 Encounters:  05/12/19 (!) 143/92  05/09/19 (!) 152/92   04/24/19 138/81   Exam: Normal Speech.  NAD  Lab and Radiology Results No results found for this or any previous visit (from the past 72 hour(s)). No results found.     Assessment and Plan: 33 y.o. male with The primary encounter diagnosis was Generalized anxiety disorder. Diagnoses of Adult ADHD and Hypertension goal BP (blood pressure) < 130/80 were also pertinent to this visit.  OK to try stopping BP meds if desired, pt thinks he might stay on these.  Will restart ADHD meds Will trial Wellbutrin for ADD/anxiety given failure of SSRI in the past, might consider SNRI   PDMP reviewed during this encounter. No orders of the defined types were placed in this encounter.  Meds ordered this encounter  Medications  . amphetamine-dextroamphetamine (ADDERALL XR) 10 MG 24 hr capsule    Sig: Take 1 capsule (10 mg total) by mouth daily.    Dispense:  30 capsule    Refill:  0  . amLODipine (NORVASC) 10 MG tablet    Sig: Take 1 tablet (10 mg total) by mouth daily.    Dispense:  90 tablet    Refill:  0    holding as of 08/07/19 - intentional weight loss pt would liek to try coming  off Rx see note  . buPROPion (WELLBUTRIN XL) 150 MG 24 hr tablet    Sig: Take 1 tablet (150 mg total) by mouth every morning.    Dispense:  90 tablet    Refill:  1      Follow Up Instructions: Return for phone call in 2 wk (reminder set in Epic) to check how he's doing w/ BP and on new meds (wellbutrin).    I discussed the assessment and treatment plan with the patient. The patient was provided an opportunity to ask questions and all were answered. The patient agreed with the plan and demonstrated an understanding of the instructions.   The patient was advised to call back or seek an in-person evaluation if any new concerns, if symptoms worsen or if the condition fails to improve as anticipated.  25 minutes of non-face-to-face time was provided during this  encounter.      . . . . . . . . . . . . . Marland Kitchen                   Historical information moved to improve visibility of documentation.  Past Medical History:  Diagnosis Date  . ADHD   . Anxiety   . Elevated ALT measurement 05/11/2017   AST:ALT 0.5  . Hypertension   . Hypertriglyceridemia 04/10/2019  . Obesity   . Pneumonia 2016   Past Surgical History:  Procedure Laterality Date  . EVALUATION UNDER ANESTHESIA WITH HEMORRHOIDECTOMY N/A 05/12/2019   Procedure: ANORECTAL EXAM UNDER ANESTHESIA WITH LIFT REPAIR;  Surgeon: Karie Soda, MD;  Location: [facility];  Service: General;  Laterality: N/A;  . FRACTURE SURGERY Right 2008   Rt knee  . FRACTURE SURGERY Right 2016   Rt wrist from old injury  . LEG SURGERY    . WRIST ARTHROSCOPY     Social History   Tobacco Use  . Smoking status: Current Every Day Smoker    Packs/day: 1.00    Years: 15.00    Pack years: 15.00    Types: Cigarettes  . Smokeless tobacco: Never Used  Substance Use Topics  . Alcohol use: Yes    Alcohol/week: 7.0 standard drinks    Types: 7 Cans of beer per week    Comment: sometimes 6 in a day   family history includes Diabetes in his father; Hyperlipidemia in his mother; Hypertension in his mother.  Medications: Current Outpatient Medications  Medication Sig Dispense Refill  . amLODipine (NORVASC) 10 MG tablet Take 1 tablet (10 mg total) by mouth daily. 90 tablet 0  . amphetamine-dextroamphetamine (ADDERALL XR) 10 MG 24 hr capsule Take 1 capsule (10 mg total) by mouth daily. 30 capsule 0  . oxyCODONE (OXY IR/ROXICODONE) 5 MG immediate release tablet Take 1-2 tablets (5-10 mg total) by mouth every 6 (six) hours as needed for moderate pain, severe pain or breakthrough pain. 30 tablet 0   No current facility-administered medications for this visit.    No Known Allergies

## 2019-08-23 ENCOUNTER — Telehealth: Payer: Self-pay | Admitting: Osteopathic Medicine

## 2019-08-23 NOTE — Telephone Encounter (Addendum)
-----   Message from [place], DO sent at 08/07/2019  4:15 PM EST -----   Please call pt, following up from last visit to see how he's doing w/ BP and on new meds (wellbutrin).

## 2019-08-23 NOTE — Telephone Encounter (Signed)
At provider's request, contacted pt regarding provider's note. As per pt, he is doing well on the medications. Aware to contact the office as needed or changes with medications. No other inquiries during call.

## 2019-09-05 IMAGING — US US ABDOMEN LIMITED
1 series · 14 of 25 positions shown · non-contrast
Comparison: None.

CLINICAL DATA: Right upper quadrant abdominal pain for 2 years

EXAM:
ULTRASOUND ABDOMEN LIMITED RIGHT UPPER QUADRANT

[Series 1: us abdomen limited · 0.15mm/px · 14 of 42 slices shown]
[im 1/42]
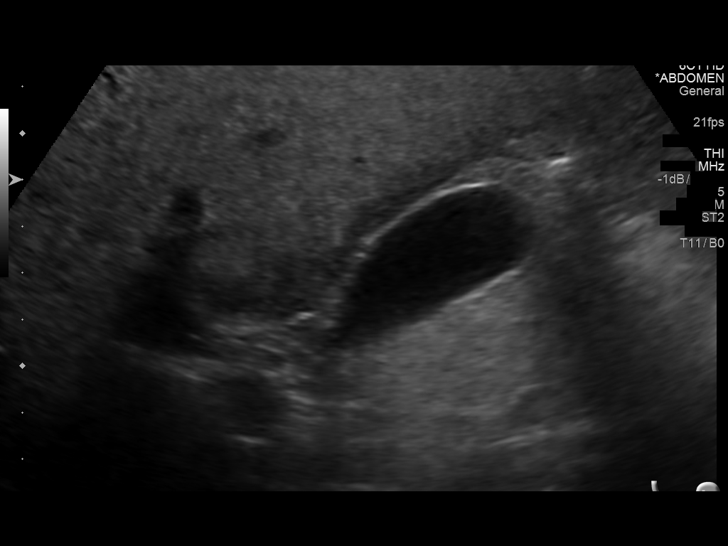
[im 4/42]
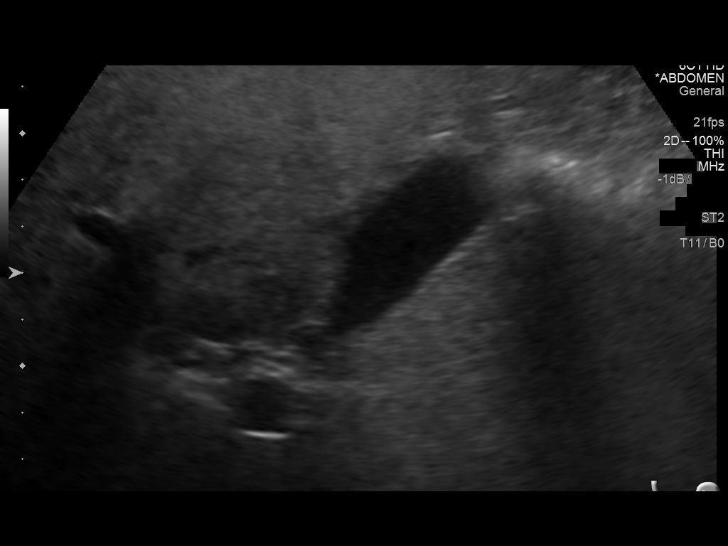
[im 7/42]
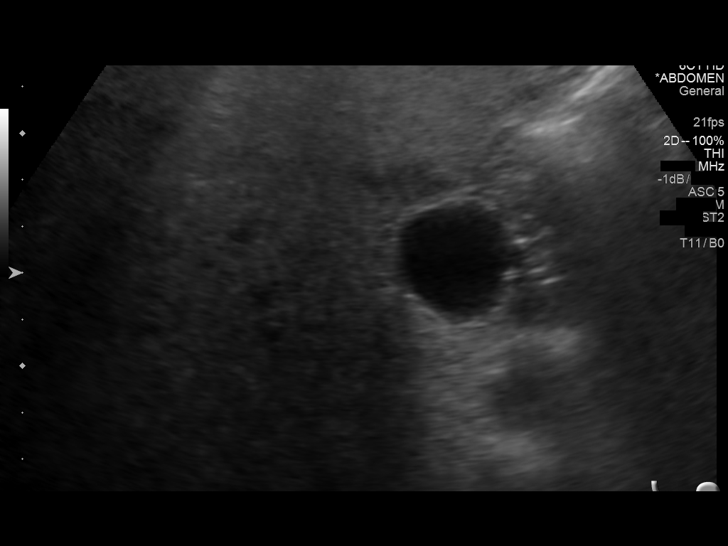
[im 11/42]
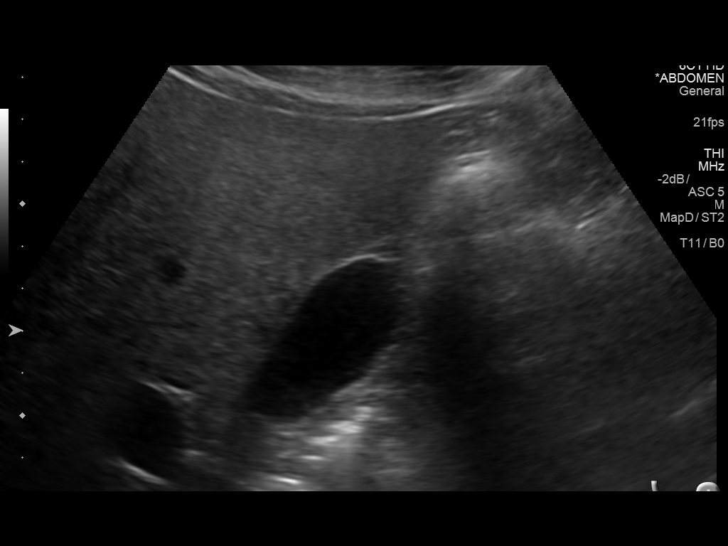
[im 14/42]
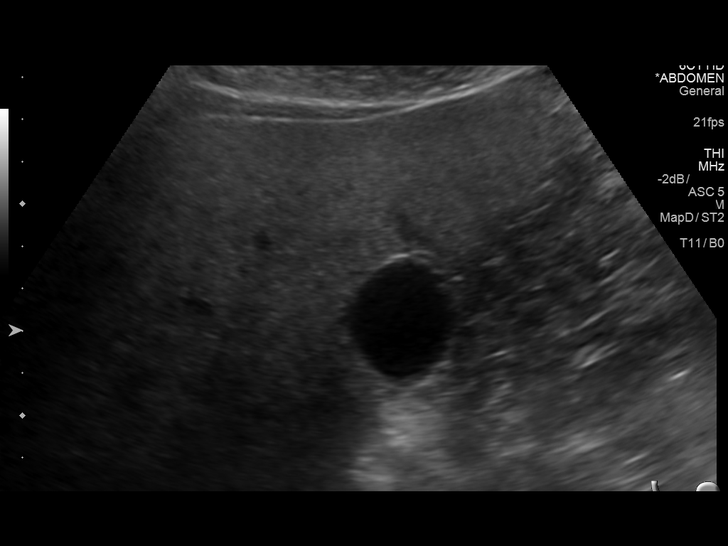
[im 16/42]
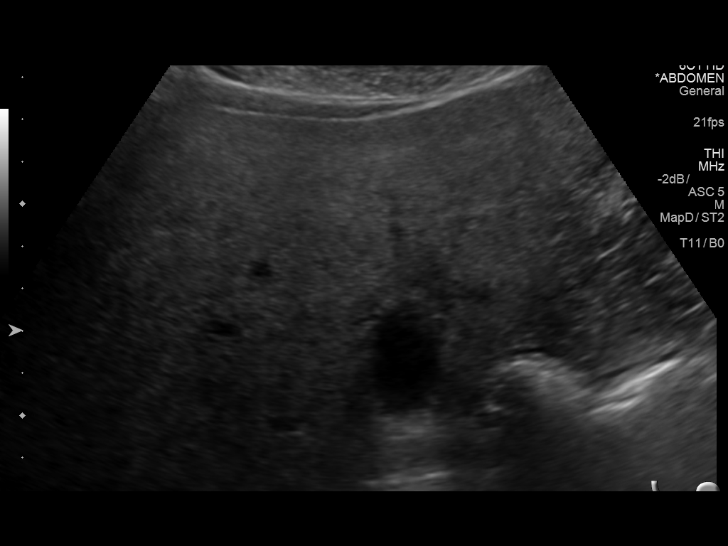
[im 19/42]
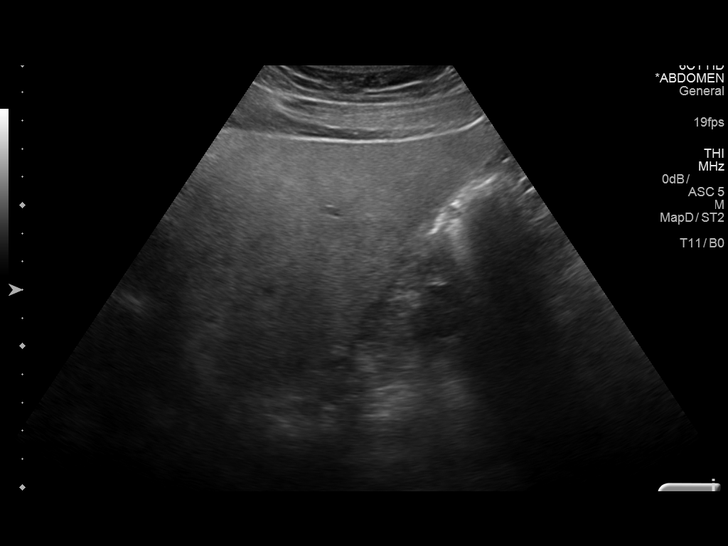
[im 23/42]
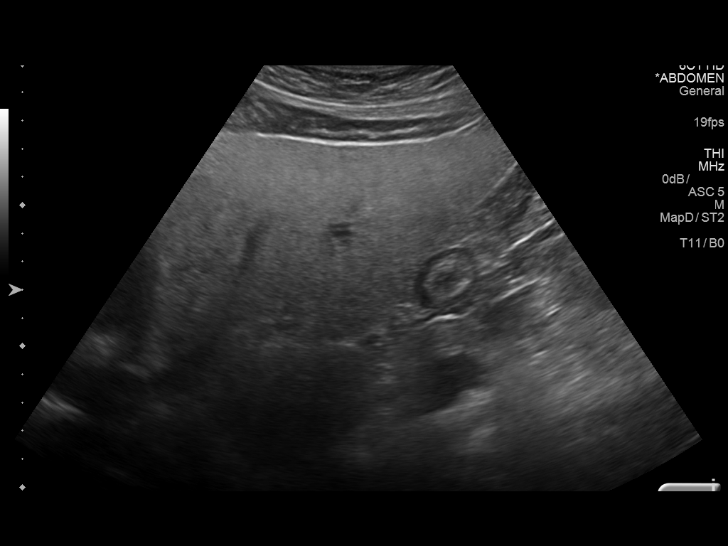
[im 26/42]
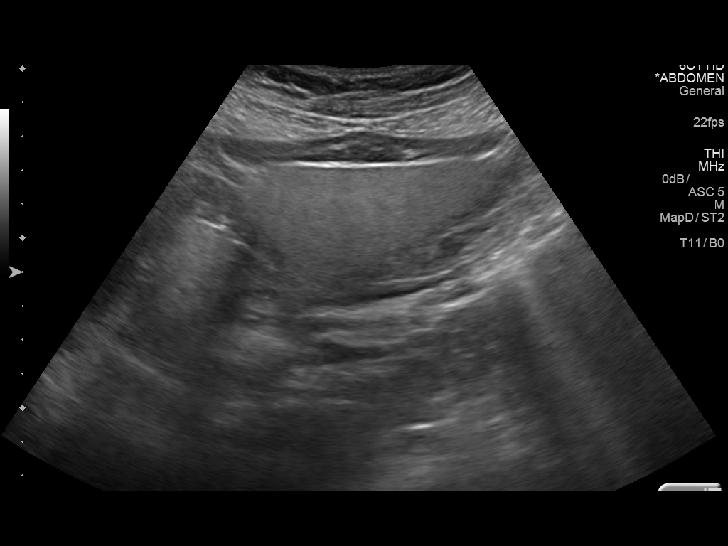
[im 28/42]
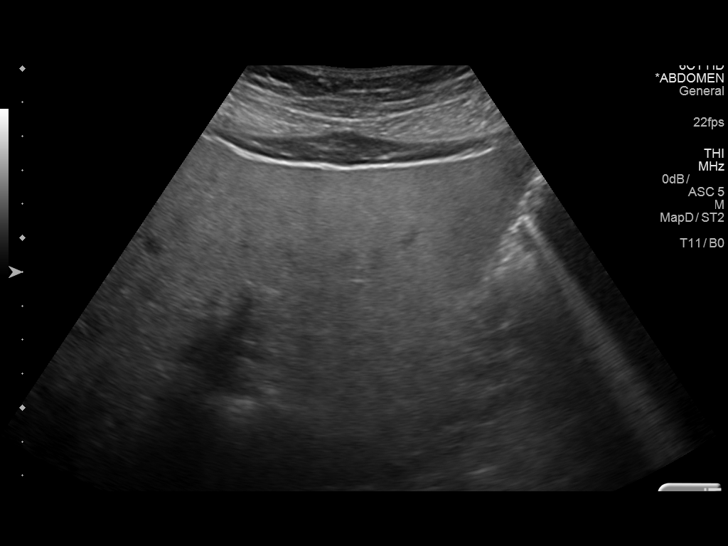
[im 31/42]
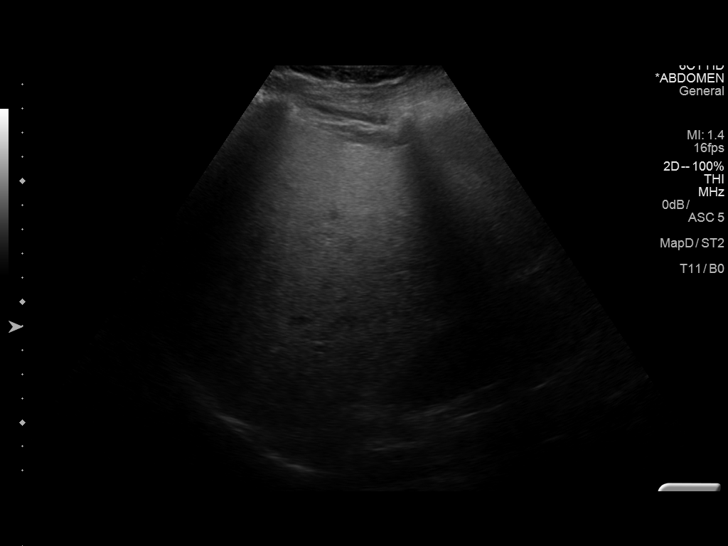
[im 35/42]
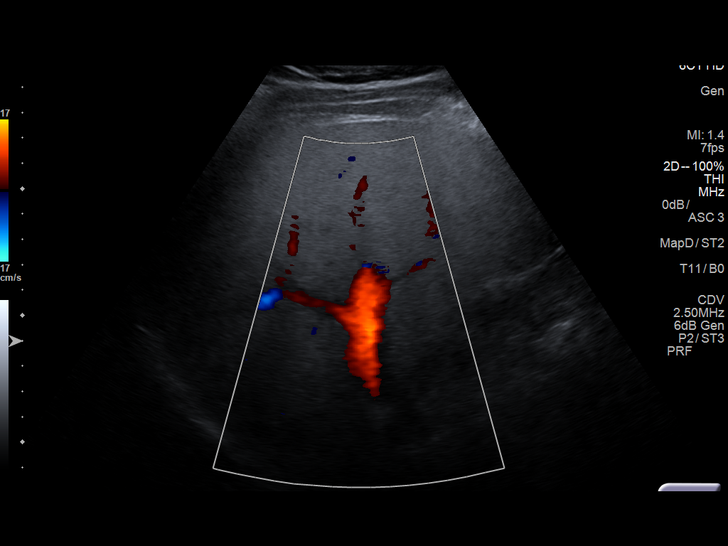
[im 38/42]
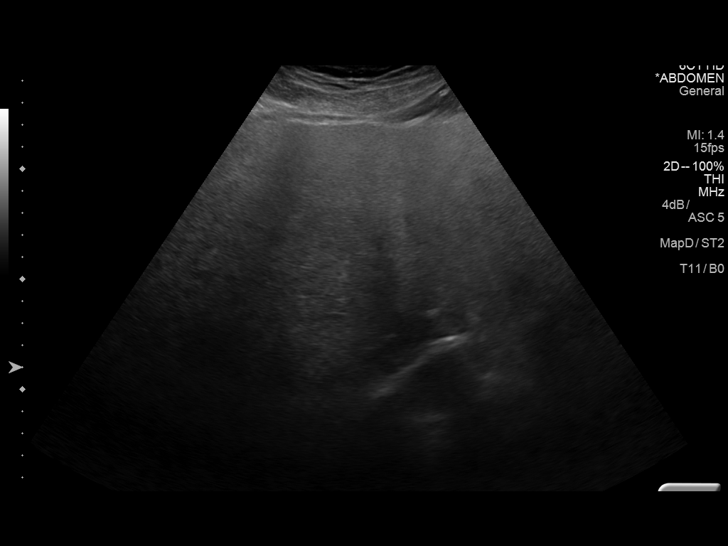
[im 42/42]
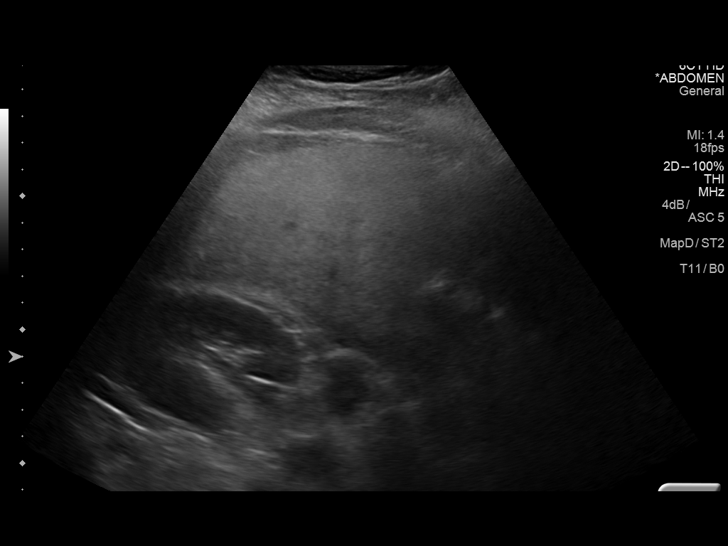

[14 of 25 positions shown; findings below may reference images not displayed]

FINDINGS: Gallbladder:

No gallstones or wall thickening visualized. No sonographic Murphy
sign noted by sonographer.

Common bile duct:

Diameter: Normal caliber, 2 mm

Liver:

Diffusely increased echotexture throughout the liver. No focal
abnormality or biliary duct dilatation. Area of focal fatty sparing
adjacent to the gallbladder fossa. Portal vein is patent on color
Doppler imaging with normal direction of blood flow towards the
liver.
IMPRESSION: Fatty infiltration of the liver.  No acute findings.

## 2019-10-02 ENCOUNTER — Other Ambulatory Visit: Payer: Self-pay

## 2019-10-02 DIAGNOSIS — F909 Attention-deficit hyperactivity disorder, unspecified type: Secondary | ICD-10-CM

## 2019-10-02 MED ORDER — AMPHETAMINE-DEXTROAMPHET ER 10 MG PO CP24: 10.0000 mg | ORAL_CAPSULE | Freq: Every day | ORAL | 0 refills | Status: DC

## 2019-10-02 NOTE — Telephone Encounter (Signed)
Pt requesting refill of Adderall XR 10 mg. Rx tee'd up and ready for review.

## 2019-10-05 ENCOUNTER — Other Ambulatory Visit: Payer: Self-pay

## 2019-10-05 ENCOUNTER — Other Ambulatory Visit: Payer: Self-pay | Admitting: Osteopathic Medicine

## 2019-10-05 DIAGNOSIS — I10 Essential (primary) hypertension: Secondary | ICD-10-CM

## 2019-10-05 DIAGNOSIS — F909 Attention-deficit hyperactivity disorder, unspecified type: Secondary | ICD-10-CM

## 2019-10-05 MED ORDER — AMLODIPINE BESYLATE 10 MG PO TABS: 10.0000 mg | ORAL_TABLET | Freq: Every day | ORAL | 0 refills | Status: DC

## 2019-10-05 MED ORDER — AMPHETAMINE-DEXTROAMPHET ER 10 MG PO CP24: 10.0000 mg | ORAL_CAPSULE | Freq: Every day | ORAL | 0 refills | Status: DC

## 2019-10-18 ENCOUNTER — Ambulatory Visit (INDEPENDENT_AMBULATORY_CARE_PROVIDER_SITE_OTHER): Payer: BC Managed Care – PPO | Admitting: Physician Assistant

## 2019-10-18 ENCOUNTER — Encounter: Payer: BC Managed Care – PPO | Admitting: Osteopathic Medicine

## 2019-10-18 ENCOUNTER — Other Ambulatory Visit: Payer: Self-pay

## 2019-10-18 VITALS — BP 156/95 | HR 79 | Ht 71.0 in | Wt 231.0 lb

## 2019-10-18 DIAGNOSIS — E6609 Other obesity due to excess calories: Secondary | ICD-10-CM

## 2019-10-18 DIAGNOSIS — Z9189 Other specified personal risk factors, not elsewhere classified: Secondary | ICD-10-CM

## 2019-10-18 DIAGNOSIS — F909 Attention-deficit hyperactivity disorder, unspecified type: Secondary | ICD-10-CM | POA: Diagnosis not present

## 2019-10-18 DIAGNOSIS — Z13220 Encounter for screening for lipoid disorders: Secondary | ICD-10-CM | POA: Diagnosis not present

## 2019-10-18 DIAGNOSIS — I10 Essential (primary) hypertension: Secondary | ICD-10-CM

## 2019-10-18 DIAGNOSIS — Z6832 Body mass index (BMI) 32.0-32.9, adult: Secondary | ICD-10-CM

## 2019-10-18 DIAGNOSIS — R0683 Snoring: Secondary | ICD-10-CM

## 2019-10-18 DIAGNOSIS — Z131 Encounter for screening for diabetes mellitus: Secondary | ICD-10-CM

## 2019-10-18 DIAGNOSIS — Z720 Tobacco use: Secondary | ICD-10-CM

## 2019-10-18 DIAGNOSIS — Z0000 Encounter for general adult medical examination without abnormal findings: Secondary | ICD-10-CM | POA: Diagnosis not present

## 2019-10-18 DIAGNOSIS — R519 Headache, unspecified: Secondary | ICD-10-CM

## 2019-10-18 DIAGNOSIS — G478 Other sleep disorders: Secondary | ICD-10-CM

## 2019-10-18 DIAGNOSIS — F331 Major depressive disorder, recurrent, moderate: Secondary | ICD-10-CM

## 2019-10-18 DIAGNOSIS — F4010 Social phobia, unspecified: Secondary | ICD-10-CM

## 2019-10-18 LAB — COMPLETE METABOLIC PANEL WITH GFR
AG Ratio: 2.1 (calc) (ref 1.0–2.5)
ALT: 43 U/L (ref 9–46)
AST: 26 U/L (ref 10–40)
Albumin: 4.9 g/dL (ref 3.6–5.1)
Alkaline phosphatase (APISO): 77 U/L (ref 36–130)
BUN: 12 mg/dL (ref 7–25)
CO2: 23 mmol/L (ref 20–32)
Calcium: 9.7 mg/dL (ref 8.6–10.3)
Chloride: 104 mmol/L (ref 98–110)
Creat: 1.03 mg/dL (ref 0.60–1.35)
GFR, Est African American: 111 mL/min/{1.73_m2} (ref >=60)
GFR, Est Non African American: 96 mL/min/{1.73_m2} (ref >=60)
Globulin: 2.3 g/dL (calc) (ref 1.9–3.7)
Glucose, Bld: 95 mg/dL (ref 65–99)
Potassium: 4.5 mmol/L (ref 3.5–5.3)
Sodium: 137 mmol/L (ref 135–146)
Total Bilirubin: 1.1 mg/dL (ref 0.2–1.2)
Total Protein: 7.2 g/dL (ref 6.1–8.1)

## 2019-10-18 LAB — LIPID PANEL W/REFLEX DIRECT LDL
Cholesterol: 192 mg/dL (ref <200)
HDL: 45 mg/dL (ref >=40)
LDL Cholesterol (Calc): 124 mg/dL (calc) — ABNORMAL HIGH
Non-HDL Cholesterol (Calc): 147 mg/dL (calc) — ABNORMAL HIGH (ref <130)
Total CHOL/HDL Ratio: 4.3 (calc) (ref <5.0)
Triglycerides: 123 mg/dL (ref <150)

## 2019-10-18 MED ORDER — AMPHETAMINE-DEXTROAMPHET ER 20 MG PO CP24: 20.0000 mg | ORAL_CAPSULE | ORAL | 0 refills | Status: DC

## 2019-10-18 MED ORDER — AMLODIPINE BESYLATE 10 MG PO TABS: 10.0000 mg | ORAL_TABLET | Freq: Every day | ORAL | 1 refills | Status: DC

## 2019-10-18 MED ORDER — VORTIOXETINE HBR 5 MG PO TABS: 5.0000 mg | ORAL_TABLET | Freq: Every day | ORAL | 1 refills | Status: DC

## 2019-10-18 NOTE — Progress Notes (Signed)
PHQ9 (19) -GAD7 (16) completed.  Wellbutrin not helping.  Hasn't taken blood pressure meds in a week.

## 2019-10-18 NOTE — Progress Notes (Signed)
Subjective:    Patient ID: Luke Wilkerson, male    DOB: 09/30/1985, 34 y.o.   MRN: [id number]  HPI  Pt is a 34 yo male who presents to the clinic for CPE.   He is not taking his BP medication today because he ran out. He denies any problems with it. He is not checking BP at home.   He admits to not sleeping well, snoring, waking up feeling tired, frequent headaches. Never had a sleep study.   Admits to problems with anxiety and depression. Tried lexapro, zoloft, buspar and wellbutrin with no benefit. No SI/HC.   He has had headache dull more left upper frontal lobe for the last 3 months. He admits he needs glasses and he headache worse after being on computer. No n/v/d, no light or sound sensitivity. ibuptofen helps some.      .. Active Ambulatory Problems    Diagnosis Date Noted  . Back pain 02/08/2014  . Alcohol consumption of more than two drinks per day 03/12/2017  . Adult ADHD 03/12/2017  . Elevated blood pressure reading 03/12/2017  . Moderate episode of recurrent major depressive disorder ([place]) 03/12/2017  . Social anxiety disorder 03/12/2017  . Snoring 03/12/2017  . At risk for obstructive sleep apnea 03/12/2017  . Tobacco use 03/12/2017  . Hypertension goal BP (blood pressure) < 130/80 04/09/2017  . RUQ abdominal pain 05/10/2017  . Elevated ALT measurement 05/11/2017  . NAFL (nonalcoholic fatty liver) 12/45/8099  . Perianal abscess 06/21/2017  . Hypertriglyceridemia 04/10/2019  . 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 05/12/2019  . Non-restorative sleep 10/20/2019  . Frequent headaches 10/20/2019  . Class 1 obesity due to excess calories with serious comorbidity and body mass index (BMI) of 32.0 to 32.9 in adult 10/20/2019   Resolved Ambulatory Problems    Diagnosis Date Noted  . No Resolved Ambulatory Problems   Past Medical History:  Diagnosis Date  . ADHD   . Anxiety   . Hypertension   . Obesity   . Pneumonia 2016   .Marland Kitchen Family History   Problem Relation Age of Onset  . Hypertension Mother   . Hyperlipidemia Mother   . Diabetes Father    .Marland Kitchen Social History   Socioeconomic History  . Marital status: Single    Spouse name: Not on file  . Number of children: Not on file  . Years of education: Not on file  . Highest education level: Not on file  Occupational History  . Not on file  Tobacco Use  . Smoking status: Current Every Day Smoker    Packs/day: 1.00    Years: 15.00    Pack years: 15.00    Types: Cigarettes  . Smokeless tobacco: Never Used  Substance and Sexual Activity  . Alcohol use: Yes    Alcohol/week: 7.0 standard drinks    Types: 7 Cans of beer per week    Comment: sometimes 6 in a day  . Drug use: No  . Sexual activity: Yes  Other Topics Concern  . Not on file  Social History Narrative  . Not on file   Social Determinants of Health   Financial Resource Strain:   . Difficulty of Paying Living Expenses: Not on file  Food Insecurity:   . Worried About Charity fundraiser in the Last Year: Not on file  . Ran Out of Food in the Last Year: Not on file  Transportation Needs:   . Lack of Transportation (Medical): Not on file  .  Lack of Transportation (Non-Medical): Not on file  Physical Activity:   . Days of Exercise per Week: Not on file  . Minutes of Exercise per Session: Not on file  Stress:   . Feeling of Stress : Not on file  Social Connections:   . Frequency of Communication with Friends and Family: Not on file  . Frequency of Social Gatherings with Friends and Family: Not on file  . Attends Religious Services: Not on file  . Active Member of Clubs or Organizations: Not on file  . Attends Banker Meetings: Not on file  . Marital Status: Not on file  Intimate Partner Violence:   . Fear of Current or Ex-Partner: Not on file  . Emotionally Abused: Not on file  . Physically Abused: Not on file  . Sexually Abused: Not on file     Review of Systems  All other systems  reviewed and are negative.      Objective:   Physical Exam BP (!) 156/95   Pulse 79   Ht 5\' 11"  (1.803 m)   Wt 231 lb (104.8 kg)   SpO2 99%   BMI 32.22 kg/m   General Appearance:    Alert, cooperative, no distress, appears stated age  Head:    Normocephalic, without obvious abnormality, atraumatic  Eyes:    PERRL, conjunctiva/corneas clear, EOM's intact, fundi    benign, both eyes       Ears:    Normal TM's and external ear canals, both ears  Nose:   Nares normal, septum midline, mucosa normal, no drainage    or sinus tenderness  Throat:   Lips, mucosa, and tongue normal; teeth and gums normal  Neck:   Supple, symmetrical, trachea midline, no adenopathy;       thyroid:  No enlargement/tenderness/nodules; no carotid   bruit or JVD  Back:     Symmetric, no curvature, ROM normal, no CVA tenderness  Lungs:     Clear to auscultation bilaterally, respirations unlabored  Chest wall:    No tenderness or deformity  Heart:    Regular rate and rhythm, S1 and S2 normal, no murmur, rub   or gallop  Abdomen:     Soft, non-tender, bowel sounds active all four quadrants,    no masses, no organomegaly        Extremities:   Extremities normal, atraumatic, no cyanosis or edema  Pulses:   2+ and symmetric all extremities  Skin:   Skin color, texture, turgor normal, no rashes or lesions  Lymph nodes:   Cervical, supraclavicular, and axillary nodes normal  Neurologic:   CNII-XII intact. Normal strength, sensation and reflexes      throughout     . Depression screen [facility] 2/9 10/18/2019 08/07/2019 04/24/2019 05/10/2017 04/09/2017  Decreased Interest 3 3 0 3 3  Down, Depressed, Hopeless 3 1 0 2 1  PHQ - 2 Score 6 4 0 5 4  Altered sleeping 3 1 0 3 0  Tired, decreased energy 1 1 0 1 0  Change in appetite 2 0 0 3 3  Feeling bad or failure about yourself  1 0 0 0 0  Trouble concentrating 3 1 3 3 2   Moving slowly or fidgety/restless 2 0 0 1 0  Suicidal thoughts 1 0 0 0 0  PHQ-9 Score 19 7 3 16 9    Difficult doing work/chores Extremely dIfficult Very difficult Somewhat difficult Somewhat difficult -   .. GAD 7 : Generalized Anxiety Score 10/18/2019 08/07/2019  04/24/2019 05/10/2017  Nervous, Anxious, on Edge 0 3 3 1   Control/stop worrying 3 3 3 3   Worry too much - different things 3 3 3 3   Trouble relaxing 3 3 3 3   Restless 1 1 2 1   Easily annoyed or irritable 3 3 3 3   Afraid - awful might happen 3 3 2 3   Total GAD 7 Score 16 19 19 17   Anxiety Difficulty Extremely difficult Very difficult Somewhat difficult Somewhat difficult        Assessment & Plan:   [place] was seen today for annual exam.  Diagnoses and all orders for this visit:  Routine physical examination -     Lipid Panel w/reflex Direct LDL -     COMPLETE METABOLIC PANEL WITH GFR  Hypertension goal BP (blood pressure) < 130/80 -     amLODipine (NORVASC) 10 MG tablet; Take 1 tablet (10 mg total) by mouth daily.  Adult ADHD -     amphetamine-dextroamphetamine (ADDERALL XR) 20 MG 24 hr capsule; Take 1 capsule (20 mg total) by mouth every morning.  Screening for lipid disorders -     Lipid Panel w/reflex Direct LDL  Screening for diabetes mellitus -     COMPLETE METABOLIC PANEL WITH GFR  Tobacco use  Moderate episode of recurrent major depressive disorder (HCC) -     vortioxetine HBr (TRINTELLIX) 5 MG TABS tablet; Take 1 tablet (5 mg total) by mouth daily.  Social anxiety disorder  At risk for obstructive sleep apnea -     Home sleep test  Snoring -     Home sleep test  Non-restorative sleep -     Home sleep test  Class 1 obesity due to excess calories with serious comorbidity and body mass index (BMI) of 32.0 to 32.9 in adult -     Home sleep test  Frequent headaches   . Start a regular exercise program and make sure you are eating a healthy diet Try to eat 4 servings of dairy a day or take a calcium supplement (500mg  twice a day).  declined flu and Tdap.  Increased Adderall.  Added  trintellix. Discussed side effects.  BP not to goal today but not taking norvasc. Take daily follow up in 2 months.  Will get home sleep study.  Fasting labs ordered.  For headaches need to get vision checked as well. Could be eye strain.   Discussed smoking cessation. Pt is not ready to quit.    Follow up in 2 months or sooner if headache changes, worsens.

## 2019-10-18 NOTE — Patient Instructions (Signed)

## 2019-10-19 NOTE — Progress Notes (Signed)
Call pt:   TG are better which is great. LDL up a little. Ok for age and current risk factors.  Kidney, liver, glucose look great.

## 2019-10-20 ENCOUNTER — Encounter: Payer: Self-pay | Admitting: Physician Assistant

## 2019-10-20 DIAGNOSIS — E66811 Obesity, class 1: Secondary | ICD-10-CM | POA: Insufficient documentation

## 2019-10-20 DIAGNOSIS — E6609 Other obesity due to excess calories: Secondary | ICD-10-CM | POA: Insufficient documentation

## 2019-10-20 DIAGNOSIS — G478 Other sleep disorders: Secondary | ICD-10-CM | POA: Insufficient documentation

## 2019-10-20 DIAGNOSIS — R519 Headache, unspecified: Secondary | ICD-10-CM | POA: Insufficient documentation

## 2019-10-23 ENCOUNTER — Telehealth: Payer: Self-pay | Admitting: Physician Assistant

## 2019-10-23 NOTE — Telephone Encounter (Signed)
Patient wanted to make sure his last visit was coded as a physical. I did also give him billings number but he was considered about the visit.

## 2019-10-23 NOTE — Telephone Encounter (Signed)
2/17 first code was CPE it did have other codes associated like ADHD that were discussed but billing code was preventative.

## 2019-12-13 ENCOUNTER — Encounter: Payer: Self-pay | Admitting: Physician Assistant

## 2019-12-13 ENCOUNTER — Other Ambulatory Visit: Payer: Self-pay

## 2019-12-13 ENCOUNTER — Ambulatory Visit (INDEPENDENT_AMBULATORY_CARE_PROVIDER_SITE_OTHER): Payer: BC Managed Care – PPO | Admitting: Physician Assistant

## 2019-12-13 VITALS — BP 139/88 | HR 86 | Ht 71.0 in | Wt 227.0 lb

## 2019-12-13 DIAGNOSIS — I10 Essential (primary) hypertension: Secondary | ICD-10-CM

## 2019-12-13 DIAGNOSIS — F4010 Social phobia, unspecified: Secondary | ICD-10-CM

## 2019-12-13 DIAGNOSIS — F909 Attention-deficit hyperactivity disorder, unspecified type: Secondary | ICD-10-CM

## 2019-12-13 MED ORDER — HYDROXYZINE HCL 10 MG PO TABS: 10.0000 mg | ORAL_TABLET | Freq: Three times a day (TID) | ORAL | 1 refills | Status: DC | PRN

## 2019-12-13 MED ORDER — AMPHETAMINE-DEXTROAMPHET ER 20 MG PO CP24: 20.0000 mg | ORAL_CAPSULE | ORAL | 0 refills | Status: DC

## 2019-12-13 NOTE — Progress Notes (Signed)
Subjective:    Patient ID: Luke Wilkerson, male    DOB: [birth year], 34 y.o.   MRN: [id number]  HPI  Pt is a 34 year old male with hypertension, ADHD, social anxiety disorder, depressed mood who presents to the clinic for follow-up and medication refills.  Patient feels like the Adderall 20 mg is helping him significantly.  He does like this dose.  He denies any increase in anxiety, insomnia, palpitations or headaches.  Patient did not start Trintellix.  He has tried multiple antidepressants in the past and he just feels like they do not really work.  He finds that most of his anxiety is in social situations.  He has been working on making himself get into these environments.  He does find once he gets there he can have an enjoyable time.  He does find himself worrying a lot about different things.  He wonders if there is something he could take as needed for anxiety.  HTN- taking norvasc. No CP, palpitations, headaches, or vision changes.   .. Active Ambulatory Problems    Diagnosis Date Noted  . Back pain 02/08/2014  . Alcohol consumption of more than two drinks per day 03/12/2017  . Adult ADHD 03/12/2017  . Elevated blood pressure reading 03/12/2017  . Moderate episode of recurrent major depressive disorder ([place]) 03/12/2017  . Social anxiety disorder 03/12/2017  . Snoring 03/12/2017  . At risk for obstructive sleep apnea 03/12/2017  . Tobacco use 03/12/2017  . Hypertension goal BP (blood pressure) < 130/80 04/09/2017  . RUQ abdominal pain 05/10/2017  . Elevated ALT measurement 05/11/2017  . NAFL (nonalcoholic fatty liver) 29/52/8413  . Perianal abscess 06/21/2017  . Hypertriglyceridemia 04/10/2019  . 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 05/12/2019  . Non-restorative sleep 10/20/2019  . Frequent headaches 10/20/2019  . Class 1 obesity due to excess calories with serious comorbidity and body mass index (BMI) of 32.0 to 32.9 in adult 10/20/2019   Resolved Ambulatory  Problems    Diagnosis Date Noted  . No Resolved Ambulatory Problems   Past Medical History:  Diagnosis Date  . ADHD   . Anxiety   . Hypertension   . Obesity   . Pneumonia 2016      Review of Systems See HPI.     Objective:   Physical Exam Vitals reviewed.  Constitutional:      Appearance: Normal appearance.  HENT:     Head: Normocephalic.  Cardiovascular:     Rate and Rhythm: Normal rate and regular rhythm.     Pulses: Normal pulses.  Pulmonary:     Effort: Pulmonary effort is normal.     Breath sounds: Normal breath sounds.  Neurological:     General: No focal deficit present.     Mental Status: He is alert and oriented to person, place, and time.  Psychiatric:        Mood and Affect: Mood normal.     .. Depression screen [facility] 2/9 12/13/2019 10/18/2019 08/07/2019 04/24/2019 05/10/2017  Decreased Interest 1 3 3  0 3  Down, Depressed, Hopeless 0 3 1 0 2  PHQ - 2 Score 1 6 4  0 5  Altered sleeping 1 3 1  0 3  Tired, decreased energy 1 1 1  0 1  Change in appetite 0 2 0 0 3  Feeling bad or failure about yourself  0 1 0 0 0  Trouble concentrating 0 3 1 3 3   Moving slowly or fidgety/restless 0 2 0 0 1  Suicidal thoughts  0 1 0 0 0  PHQ-9 Score 3 19 7 3 16   Difficult doing work/chores Not difficult at all Extremely dIfficult Very difficult Somewhat difficult Somewhat difficult   . GAD 7 : Generalized Anxiety Score 12/13/2019 10/18/2019 08/07/2019 04/24/2019  Nervous, Anxious, on Edge 3 0 3 3  Control/stop worrying 3 3 3 3   Worry too much - different things 3 3 3 3   Trouble relaxing 1 3 3 3   Restless 1 1 1 2   Easily annoyed or irritable 3 3 3 3   Afraid - awful might happen 3 3 3 2   Total GAD 7 Score 17 16 19 19   Anxiety Difficulty Very difficult Extremely difficult Very difficult Somewhat difficult          Assessment & Plan:  04/26/2019 [place] was seen today for depression.  Diagnoses and all orders for this visit:  Adult ADHD -     amphetamine-dextroamphetamine  (ADDERALL XR) 20 MG 24 hr capsule; Take 1 capsule (20 mg total) by mouth every morning. -     amphetamine-dextroamphetamine (ADDERALL XR) 20 MG 24 hr capsule; Take 1 capsule (20 mg total) by mouth every morning. -     amphetamine-dextroamphetamine (ADDERALL XR) 20 MG 24 hr capsule; Take 1 capsule (20 mg total) by mouth every morning.  Hypertension goal BP (blood pressure) < 130/80  Social anxiety disorder -     hydrOXYzine (ATARAX/VISTARIL) 10 MG tablet; Take 1 tablet (10 mg total) by mouth 3 (three) times daily as needed.   PHQ numbers are down significantly.  GAD numbers are elevated.  He does not think this is related to his increase in Adderall.  In fact he feels like Adderall helps his anxiety.  Adderall was refilled for 3 months.  Discussed other ways to decrease anxiety with meditation and exercise.  Also gave him hydroxyzine 10 mg to take as needed for anxiety and social situations.  Discussed side effects.  Blood pressure looks much better today.  Continue Norvasc.  We will continue to monitor.  Follow-up in 3 months.

## 2019-12-13 NOTE — Patient Instructions (Signed)
Social Anxiety Disorder, Adult Social anxiety disorder (SAD), previously called social phobia, is a mental health condition. People with SAD often feel nervous, afraid, or embarrassed when they are around other people in social situations. They worry that other people are judging or criticizing them for how they look, what they say, or how they act. SAD involves more than just feeling shy or self-conscious at times. It can cause severe emotional distress. It can interfere with activities of daily life. SAD also may lead to alcohol or drug use, and even suicide. SAD is a common mental health condition. It can develop at any time, but it usually starts in the teenage years. What are the causes? The cause of this condition is not known. It may involve genes that are passed through families. Stressful events may trigger anxiety. This disorder is also associated with an overactive amygdala. The amygdala is the part of the brain that triggers your response to strong feelings, such as fear. What increases the risk? This condition is more likely to develop in:  People who have a family history of anxiety disorders.  Women.  People who have a physical or behavioral condition that makes them feel self-conscious or nervous, such as a stutter or a long-term (chronic) disease. What are the signs or symptoms? The main symptom of this condition is fear of embarrassment caused by being criticized or judged in social situations. You may be afraid to:  Speak in public.  Go shopping.  Use a public bathroom.  Eat at a restaurant.  Go to work.  Interact with people you do not know. Extreme fear and anxiety may cause physical symptoms, including:  Blushing.  A fast heartbeat.  Sweating.  Shaky hands or voice.  Confusion.  Light-headedness.  Upset stomach, diarrhea, or vomiting.  Shortness of breath. How is this diagnosed? This condition is diagnosed based on your history, symptoms, and  behavior in social situations. You may be diagnosed with this type of anxiety if your symptoms have lasted for more than 6 months and have been present on more days than not. Your health care provider may ask you about your use of alcohol, drugs, and prescription medicines. He or she may also refer you to a mental health specialist for further evaluation or treatment. How is this treated? Treatment for this condition may include:  Cognitive behavioral therapy (CBT). This type of talk therapy helps you learn to replace negative thoughts and behaviors with positive ones. This may include learning how to use self-calming skills and other methods of managing your anxiety.  Exposure therapy. You will be exposed to social situations that cause you fear. The treatment starts with practicing self-calming in situations that cause you low levels of fear. Over time, you will progress by sustaining self-calming and managing harder situations.  Antidepressant medicines. These medicines may be used by themselves or in addition to other therapies.  Biofeedback. This process trains you to manage your body's response (physiological response) through breathing techniques and relaxation methods. You will work with a therapist while machines are used to monitor your physical symptoms.  Techniques for relaxation and managing anxiety. These include deep breathing, self-talk, meditation, visual imagery, muscle relaxation, music therapy, and yoga. These techniques are often used with other therapies to keep you calm in situations that cause you anxiety. These treatments are often used in combination. Follow these instructions at home: Alcohol use If you drink alcohol:  Limit how much you use to: ? 0-1 drink a day for   nonpregnant women. ? 0-2 drinks a day for men.  Be aware of how much alcohol is in your drink. In the [place] one drink equals one 12 oz bottle of beer (355 mL), one 5 oz glass of wine (148 mL), or one 1  oz glass of hard liquor (44 mL). General instructions  Take over-the-counter and prescription medicines only as told by your health care provider.  Practice techniques for relaxation and managing anxiety at times you are not challenged by social anxiety.  Return to social activities using techniques you have learned, as you feel ready to do so.  Avoid caffeine and certain over-the-counter cold medicines. These may make you feel worse. Ask your pharmacists which medicines to avoid.  Keep all follow-up visits as told by your health care provider. This is important. Where to find more information  National Alliance on Mental Illness (NAMI): https://www.nami.org  Social Anxiety Association: https://socialphobia.org  Mental Health America (MHA): https://www.mhanational.org  Anxiety and Depression Association of America (ADAA): https://adaa.org/ Contact a health care provider if:  Your symptoms do not improve or get worse.  You have signs of depression, such as: ? Persistent sadness or moodiness. ? Loss of enjoyment in activities that used to bring you joy. ? Change in weight or eating. ? Changes in sleeping habits. ? Avoiding friends or family members more than usual. ? Loss of energy for normal tasks. ? Feeling guilty or worthless.  You become more isolated than you normally are.  You find it more and more difficult to speak or interact with others.  You are using drugs.  You are drinking more alcohol than usual. Get help right away if:  You harm yourself.  You have suicidal thoughts. If you ever feel like you may hurt yourself or others, or have thoughts about taking your own life, get help right away. You can go to your nearest emergency department or call:  Your local emergency services (911 in the [place]  A suicide crisis helpline, such as the National Suicide Prevention Lifeline at 1-800-273-8255. This is open 24 hours a day. Summary  Social anxiety disorder  (SAD) may cause you to feel nervous, afraid, or embarrassed when you are around other people in social situations.  SAD is a common mental disorder. It can develop at any time, but it usually starts in the teenage years.  Treatment includes talk therapy, exposure therapy, medicines, biofeedback, and relaxation techniques. It can involve a combination of treatments. This information is not intended to replace advice given to you by your health care provider. Make sure you discuss any questions you have with your health care provider. Document Revised: 01/18/2019 Document Reviewed: 01/18/2019 Elsevier Patient Education  2020 Elsevier Inc.  

## 2020-03-13 ENCOUNTER — Ambulatory Visit (INDEPENDENT_AMBULATORY_CARE_PROVIDER_SITE_OTHER): Payer: BC Managed Care – PPO | Admitting: Physician Assistant

## 2020-03-13 ENCOUNTER — Encounter: Payer: Self-pay | Admitting: Physician Assistant

## 2020-03-13 VITALS — BP 141/85 | HR 82 | Ht 71.0 in | Wt 229.0 lb

## 2020-03-13 DIAGNOSIS — I10 Essential (primary) hypertension: Secondary | ICD-10-CM | POA: Diagnosis not present

## 2020-03-13 DIAGNOSIS — F4010 Social phobia, unspecified: Secondary | ICD-10-CM

## 2020-03-13 DIAGNOSIS — F909 Attention-deficit hyperactivity disorder, unspecified type: Secondary | ICD-10-CM | POA: Diagnosis not present

## 2020-03-13 MED ORDER — AMLODIPINE BESYLATE 10 MG PO TABS: 10.0000 mg | ORAL_TABLET | Freq: Every day | ORAL | 1 refills | Status: DC

## 2020-03-13 MED ORDER — AMPHETAMINE-DEXTROAMPHET ER 20 MG PO CP24: 20.0000 mg | ORAL_CAPSULE | ORAL | 0 refills | Status: DC

## 2020-03-13 MED ORDER — HYDROXYZINE HCL 10 MG PO TABS: 10.0000 mg | ORAL_TABLET | Freq: Three times a day (TID) | ORAL | 1 refills | Status: DC | PRN

## 2020-03-13 NOTE — Progress Notes (Signed)
Subjective:    Patient ID: Luke Wilkerson, male    DOB: 11/02/1985, 34 y.o.   MRN: [id number]  HPI Patient is a 34 year old obese male with hypertension, ADHD, fatigue, current smoker who presents to the clinic for 46-month follow-up.  Patient is doing well on Adderall.  He has no concerns or complaints.  He denies any insomnia or increase in anxiety.  He denies any chest pain, palpitations, headaches, dizziness, vision changes.  He is not checking his blood pressure regularly.  He admits to drinking and smoking a lot yesterday when his friends came over.  He is not interested in quitting smoking.  He notes he does need to cut back on drinking.  He cannot quantify how much alcohol he had yesterday but he did say " a lot".   .. Active Ambulatory Problems    Diagnosis Date Noted  . Back pain 02/08/2014  . Alcohol consumption of more than two drinks per day 03/12/2017  . Adult ADHD 03/12/2017  . Elevated blood pressure reading 03/12/2017  . Moderate episode of recurrent major depressive disorder ([facility]) 03/12/2017  . Social anxiety disorder 03/12/2017  . Snoring 03/12/2017  . At risk for obstructive sleep apnea 03/12/2017  . Tobacco use 03/12/2017  . Hypertension goal BP (blood pressure) < 130/80 04/09/2017  . RUQ abdominal pain 05/10/2017  . Elevated ALT measurement 05/11/2017  . NAFL (nonalcoholic fatty liver) 05/25/2017  . Perianal abscess 06/21/2017  . Hypertriglyceridemia 04/10/2019  . 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 05/12/2019  . Non-restorative sleep 10/20/2019  . Frequent headaches 10/20/2019  . Class 1 obesity due to excess calories with serious comorbidity and body mass index (BMI) of 32.0 to 32.9 in adult 10/20/2019   Resolved Ambulatory Problems    Diagnosis Date Noted  . No Resolved Ambulatory Problems   Past Medical History:  Diagnosis Date  . ADHD   . Anxiety   . Hypertension   . Obesity   . Pneumonia 2016     Review of Systems  All other  systems reviewed and are negative.      Objective:   Physical Exam Vitals reviewed.  Constitutional:      Appearance: Normal appearance. He is obese.  Cardiovascular:     Rate and Rhythm: Normal rate and regular rhythm.     Pulses: Normal pulses.  Pulmonary:     Effort: Pulmonary effort is normal.     Breath sounds: Normal breath sounds.  Neurological:     General: No focal deficit present.     Mental Status: He is alert and oriented to person, place, and time.  Psychiatric:        Mood and Affect: Mood normal.           Assessment & Plan:  Marland KitchenMarland KitchenBenjamim was seen today for adhd.  Diagnoses and all orders for this visit:  Adult ADHD -     amphetamine-dextroamphetamine (ADDERALL XR) 20 MG 24 hr capsule; Take 1 capsule (20 mg total) by mouth every morning. -     amphetamine-dextroamphetamine (ADDERALL XR) 20 MG 24 hr capsule; Take 1 capsule (20 mg total) by mouth every morning. -     amphetamine-dextroamphetamine (ADDERALL XR) 20 MG 24 hr capsule; Take 1 capsule (20 mg total) by mouth every morning.  Social anxiety disorder -     hydrOXYzine (ATARAX/VISTARIL) 10 MG tablet; Take 1 tablet (10 mg total) by mouth 3 (three) times daily as needed.  Hypertension goal BP (blood pressure) < 130/80 -  amLODipine (NORVASC) 10 MG tablet; Take 1 tablet (10 mg total) by mouth daily.   BP not to goal. Admits to drinking a lot of alcohol yesterday and smoking a lot.  Discussed high risk behaviors. Cut back to no more than 4 beers a day.  Pt declined smoking cessation.  Discussed DASH diet.  Will continue to monitor BP if stays elevated need to consider more medication.   Obesity/HTN/snoring/fatigue high risk for sleep apnea. Pt declines testing.   Refilled for 3 months.

## 2020-06-17 ENCOUNTER — Ambulatory Visit (INDEPENDENT_AMBULATORY_CARE_PROVIDER_SITE_OTHER): Payer: BC Managed Care – PPO | Admitting: Physician Assistant

## 2020-06-17 DIAGNOSIS — Z5329 Procedure and treatment not carried out because of patient's decision for other reasons: Secondary | ICD-10-CM

## 2020-06-17 NOTE — Progress Notes (Signed)
No show

## 2020-07-30 ENCOUNTER — Telehealth: Payer: Self-pay | Admitting: Neurology

## 2020-07-30 DIAGNOSIS — F909 Attention-deficit hyperactivity disorder, unspecified type: Secondary | ICD-10-CM

## 2020-07-30 NOTE — Telephone Encounter (Signed)
Patient left vm asking for refills of Adderall.   He was last seen in July and told to follow up in 3 months.   He no showed appt in October.   He stated in message that he can not afford visit until January when he has new insurance.   Please advise.

## 2020-07-31 MED ORDER — AMPHETAMINE-DEXTROAMPHET ER 20 MG PO CP24: 20.0000 mg | ORAL_CAPSULE | ORAL | 0 refills | Status: DC

## 2020-07-31 NOTE — Telephone Encounter (Signed)
Patient made aware that refill sent but no more until he is seen.

## 2020-07-31 NOTE — Telephone Encounter (Signed)
I sent one more refill since within the 6 month timeframe. He does need to keep appt in January.

## 2020-11-11 ENCOUNTER — Telehealth: Payer: Self-pay | Admitting: Neurology

## 2020-11-11 NOTE — Telephone Encounter (Signed)
Patient called and left vm that he called one week ago and hasn't heard anything about scheduling an appointment. I don't have a message from patient? Can we call to schedule follow up? Thanks. [phone].

## 2020-11-11 NOTE — Telephone Encounter (Signed)
Appointment has been scheduled, 

## 2020-11-12 ENCOUNTER — Ambulatory Visit: Payer: BC Managed Care – PPO | Admitting: Physician Assistant

## 2020-11-12 ENCOUNTER — Ambulatory Visit (INDEPENDENT_AMBULATORY_CARE_PROVIDER_SITE_OTHER): Payer: BC Managed Care – PPO | Admitting: Sports Medicine

## 2020-11-12 ENCOUNTER — Ambulatory Visit (INDEPENDENT_AMBULATORY_CARE_PROVIDER_SITE_OTHER): Payer: BC Managed Care – PPO

## 2020-11-12 ENCOUNTER — Other Ambulatory Visit: Payer: Self-pay

## 2020-11-12 DIAGNOSIS — S4991XA Unspecified injury of right shoulder and upper arm, initial encounter: Secondary | ICD-10-CM

## 2020-11-12 DIAGNOSIS — S42251A Displaced fracture of greater tuberosity of right humerus, initial encounter for closed fracture: Secondary | ICD-10-CM | POA: Diagnosis not present

## 2020-11-12 MED ORDER — OXYCODONE-ACETAMINOPHEN 10-325 MG PO TABS: 1.0000 | ORAL_TABLET | Freq: Three times a day (TID) | ORAL | 0 refills | Status: DC | PRN

## 2020-11-12 NOTE — Progress Notes (Addendum)
    Procedures performed today:    None.  Independent interpretation of notes and tests performed by another provider:   X-rays personally reviewed, there are a couple of fractures in the shoulder, there is a fracture of the greater tuberosity as well as a chip off of the socket, all consistent with a shoulder subluxation.  Brief History, Exam, Impression, and Recommendations:    Right shoulder injury This is a pleasant 35 year old male, he adopted a cat, unfortunately it tripped him down the stairs a couple of days ago. He scraped his forehead and had immediate pain in his right shoulder, and feels like he subluxed it. He has pain with almost any motion, he does need a sling, x-rays, and will do some Percocet as this works best for him. Return to see me in 2 weeks.    ___________________________________________ Ihor Austin. Benjamin Stain, M.D., ABFM., CAQSM. Primary Care and Sports Medicine [facility]  Adjunct Instructor of Family Medicine  [employer]

## 2020-11-12 NOTE — Assessment & Plan Note (Signed)
This is a pleasant 35 year old male, he adopted a cat, unfortunately it tripped him down the stairs a couple of days ago. He scraped his forehead and had immediate pain in his right shoulder, and feels like he subluxed it. He has pain with almost any motion, he does need a sling, x-rays, and will do some Percocet as this works best for him. Return to see me in 2 weeks.

## 2020-11-19 ENCOUNTER — Ambulatory Visit: Payer: BC Managed Care – PPO | Admitting: Physician Assistant

## 2020-11-19 ENCOUNTER — Other Ambulatory Visit: Payer: Self-pay

## 2020-11-19 ENCOUNTER — Encounter: Payer: Self-pay | Admitting: Physician Assistant

## 2020-11-19 VITALS — BP 156/89 | HR 75 | Ht 71.0 in | Wt 233.0 lb

## 2020-11-19 DIAGNOSIS — S29011D Strain of muscle and tendon of front wall of thorax, subsequent encounter: Secondary | ICD-10-CM | POA: Diagnosis not present

## 2020-11-19 DIAGNOSIS — S4991XA Unspecified injury of right shoulder and upper arm, initial encounter: Secondary | ICD-10-CM

## 2020-11-19 DIAGNOSIS — I10 Essential (primary) hypertension: Secondary | ICD-10-CM

## 2020-11-19 DIAGNOSIS — F909 Attention-deficit hyperactivity disorder, unspecified type: Secondary | ICD-10-CM | POA: Diagnosis not present

## 2020-11-19 MED ORDER — AMPHETAMINE-DEXTROAMPHETAMINE 20 MG PO TABS: 20.0000 mg | ORAL_TABLET | Freq: Two times a day (BID) | ORAL | 0 refills | Status: DC

## 2020-11-19 MED ORDER — OXYCODONE-ACETAMINOPHEN 10-325 MG PO TABS: 1.0000 | ORAL_TABLET | Freq: Four times a day (QID) | ORAL | 0 refills | Status: DC | PRN

## 2020-11-19 MED ORDER — AMLODIPINE BESYLATE 10 MG PO TABS: 10.0000 mg | ORAL_TABLET | Freq: Every day | ORAL | 1 refills | Status: DC

## 2020-11-19 MED ORDER — CYCLOBENZAPRINE HCL 10 MG PO TABS: 10.0000 mg | ORAL_TABLET | Freq: Three times a day (TID) | ORAL | 0 refills | Status: DC | PRN

## 2020-11-19 NOTE — Progress Notes (Signed)
Subjective:    Patient ID: Luke Wilkerson, male    DOB: [birth year], 35 y.o.   MRN: [id number]  HPI  Patient is a 35 year old male with ADHD, hypertension who presents to the clinic for med refill.  ADHD- he does not like how long the XR last. He would like to go back to as needed up to twice a day IR. No increase in anxiety. He is sleeping well. No palpitations.   HTN- he did not take norvasc this morning but is taking. No CP, palpitations, headaches or vision changes.   He tripped over Cat 3/15. He dislocated right shoulder with multiple tiny fractures. He saw Dr. Karie Schwalbe and confirmed with xray. He was placed in sling. He follows up next week with Dr. Karie Schwalbe. He was given 15 percocet and he is needing a refill before that visit. He has been taking every 8 hours. Pain can get to be 6-7/10. He is using ibuprofen 800mg  as well. He complains of some right sided chest pain, tightness, bruising.   .. Active Ambulatory Problems    Diagnosis Date Noted  . Back pain 02/08/2014  . Alcohol consumption of more than two drinks per day 03/12/2017  . Adult ADHD 03/12/2017  . Elevated blood pressure reading 03/12/2017  . Moderate episode of recurrent major depressive disorder (HCC) 03/12/2017  . Social anxiety disorder 03/12/2017  . Snoring 03/12/2017  . At risk for obstructive sleep apnea 03/12/2017  . Tobacco use 03/12/2017  . Hypertension goal BP (blood pressure) < 130/80 04/09/2017  . RUQ abdominal pain 05/10/2017  . Elevated ALT measurement 05/11/2017  . NAFL (nonalcoholic fatty liver) 05/25/2017  . Perianal abscess 06/21/2017  . Hypertriglyceridemia 04/10/2019  . 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 05/12/2019  . Non-restorative sleep 10/20/2019  . Frequent headaches 10/20/2019  . Class 1 obesity due to excess calories with serious comorbidity and body mass index (BMI) of 32.0 to 32.9 in adult 10/20/2019  . Right shoulder injury 11/12/2020   Resolved Ambulatory Problems    Diagnosis  Date Noted  . No Resolved Ambulatory Problems   Past Medical History:  Diagnosis Date  . ADHD   . Anxiety   . Hypertension   . Obesity   . Pneumonia 2016      Review of Systems See HPI.     Objective:   Physical Exam Vitals reviewed.  Constitutional:      Appearance: Normal appearance.  Cardiovascular:     Rate and Rhythm: Normal rate.     Pulses: Normal pulses.  Pulmonary:     Effort: Pulmonary effort is normal.  Musculoskeletal:     Comments: Right shoulder sling.   Neurological:     General: No focal deficit present.     Mental Status: He is alert and oriented to person, place, and time.  Psychiatric:        Mood and Affect: Mood normal.           Assessment & Plan:  2017Marland KitchenDraden was seen today for adhd and hypertension.  Diagnoses and all orders for this visit:  Adult ADHD -     amphetamine-dextroamphetamine (ADDERALL) 20 MG tablet; Take 1 tablet (20 mg total) by mouth 2 (two) times daily. -     amphetamine-dextroamphetamine (ADDERALL) 20 MG tablet; Take 1 tablet (20 mg total) by mouth 2 (two) times daily. -     amphetamine-dextroamphetamine (ADDERALL) 20 MG tablet; Take 1 tablet (20 mg total) by mouth 2 (two) times daily.  Injury of right  shoulder, initial encounter -     oxyCODONE-acetaminophen (PERCOCET) 10-325 MG tablet; Take 1 tablet by mouth every 6 (six) hours as needed for pain. -     cyclobenzaprine (FLEXERIL) 10 MG tablet; Take 1 tablet (10 mg total) by mouth 3 (three) times daily as needed for muscle spasms.  Hypertension goal BP (blood pressure) < 130/80 -     amLODipine (NORVASC) 10 MG tablet; Take 1 tablet (10 mg total) by mouth daily.  Strain of right pectoralis muscle, subsequent encounter -     cyclobenzaprine (FLEXERIL) 10 MG tablet; Take 1 tablet (10 mg total) by mouth 3 (three) times daily as needed for muscle spasms.   Pt will follow up with Dr. Karie Schwalbe in one week for right shoulder. Stay in sling. Refilled percocet. Discussed using  sparingly and alternating with 800mg  ibuprofen and replacing some percocet doses with tylenol 1000mg  doses. Discussed smoking can delay healing time. Declined smoking cessation.   ..PDMP reviewed during this encounter.   I think his right chest pain/discomfort is strain due to fall. Ice, NSAID cream, icy hot and given flexeril to use as needed. Do NOT combine with percocet.   BP not to goal. Some could be due to pain but also patient ran out of medication and did not take this morning. Refilled norvasc.   adderall switched to IR at patients request.   Follow up in 3 months.

## 2020-11-26 ENCOUNTER — Encounter: Payer: Self-pay | Admitting: Sports Medicine

## 2020-11-26 ENCOUNTER — Other Ambulatory Visit: Payer: Self-pay

## 2020-11-26 ENCOUNTER — Ambulatory Visit (INDEPENDENT_AMBULATORY_CARE_PROVIDER_SITE_OTHER): Payer: BC Managed Care – PPO | Admitting: Sports Medicine

## 2020-11-26 DIAGNOSIS — S4991XA Unspecified injury of right shoulder and upper arm, initial encounter: Secondary | ICD-10-CM | POA: Diagnosis not present

## 2020-11-26 DIAGNOSIS — S4991XD Unspecified injury of right shoulder and upper arm, subsequent encounter: Secondary | ICD-10-CM | POA: Diagnosis not present

## 2020-11-26 MED ORDER — OXYCODONE-ACETAMINOPHEN 10-325 MG PO TABS: 1.0000 | ORAL_TABLET | Freq: Four times a day (QID) | ORAL | 0 refills | Status: DC | PRN

## 2020-11-26 NOTE — Progress Notes (Signed)
    Procedures performed today:    None.  Independent interpretation of notes and tests performed by another provider:   None.  Brief History, Exam, Impression, and Recommendations:    Fracture of right greater tuberosity and inferior glenoid This is a pleasant 35 year old male, he adopted a cat and he tripped over it falling down the stairs and subluxing his shoulder. Ultimately he sustained a fracture of the greater tuberosity and the inferior glenoid. He has been in a sling for 2 weeks now and he was doing a lot better. My plan will be for more weeks in the sling followed by 2 weeks of gentle passive range of motion himself at home followed by formal physical therapy for another 4 weeks. Refilling Percocet.    ___________________________________________ Ihor Austin. Benjamin Stain, M.D., ABFM., CAQSM. Primary Care and Sports Medicine [facility]  Adjunct Instructor of Family Medicine  [employer]

## 2020-11-26 NOTE — Assessment & Plan Note (Addendum)
This is a pleasant 35 year old male, he adopted a cat and he tripped over it falling down the stairs and subluxing his shoulder. Ultimately he sustained a fracture of the greater tuberosity and the inferior glenoid. He has been in a sling for 2 weeks now and he was doing a lot better. My plan will be for more weeks in the sling followed by 2 weeks of gentle passive range of motion himself at home followed by formal physical therapy for another 4 weeks. Refilling Percocet.

## 2020-11-26 NOTE — Patient Instructions (Signed)
2 more weeks in the sling followed by 2 weeks out of the sling doing passive range of motion, physical therapy will start in about a month, return to see me in 6 weeks.

## 2020-12-11 ENCOUNTER — Encounter: Payer: Self-pay | Admitting: Physical Therapy

## 2020-12-11 ENCOUNTER — Ambulatory Visit (INDEPENDENT_AMBULATORY_CARE_PROVIDER_SITE_OTHER): Payer: BC Managed Care – PPO | Admitting: Physical Therapy

## 2020-12-11 ENCOUNTER — Other Ambulatory Visit: Payer: Self-pay

## 2020-12-11 DIAGNOSIS — M25511 Pain in right shoulder: Secondary | ICD-10-CM

## 2020-12-11 DIAGNOSIS — R29898 Other symptoms and signs involving the musculoskeletal system: Secondary | ICD-10-CM | POA: Diagnosis not present

## 2020-12-11 DIAGNOSIS — R600 Localized edema: Secondary | ICD-10-CM | POA: Diagnosis not present

## 2020-12-11 DIAGNOSIS — M6281 Muscle weakness (generalized): Secondary | ICD-10-CM | POA: Diagnosis not present

## 2020-12-11 NOTE — Therapy (Signed)
[facility] [place] [address] [place], [place], [address] Phone: [phone]   Fax:  [phone]  Physical Therapy Evaluation  Patient Details  Name: Luke Wilkerson MRN: [id number] Date of Birth: [birth year] Referring Provider (PT): thekkekandam   Encounter Date: 12/11/2020   PT End of Session - 12/11/20 1426    Visit Number 1    Number of Visits 12    Date for PT Re-Evaluation 01/22/21    PT Start Time 1348    PT Stop Time 1427    PT Time Calculation (min) 39 min    Equipment Utilized During Treatment Gait belt    Activity Tolerance Patient tolerated treatment well    Behavior During Therapy [facility] for tasks assessed/performed           Past Medical History:  Diagnosis Date  . ADHD   . Anxiety   . Elevated ALT measurement 05/11/2017   AST:ALT 0.5  . Hypertension   . Hypertriglyceridemia 04/10/2019  . Obesity   . Pneumonia 2016    Past Surgical History:  Procedure Laterality Date  . EVALUATION UNDER ANESTHESIA WITH HEMORRHOIDECTOMY N/A 05/12/2019   Procedure: ANORECTAL EXAM UNDER ANESTHESIA WITH LIFT REPAIR;  Surgeon: Karie Soda, MD;  Location: [facility];  Service: General;  Laterality: N/A;  . FRACTURE SURGERY Right 2008   Rt knee  . FRACTURE SURGERY Right 2016   Rt wrist from old injury  . LEG SURGERY    . WRIST ARTHROSCOPY      There were no vitals filed for this visit.    Subjective Assessment - 12/11/20 1354    Subjective Pt states he tripped over a cat and fell down the stairs and fractured Rt shoulder. Pt has been in sling x 4 weeks. Pt is able to work at computer. Pt states pain is "ok" when he is in the sling, hurts if he accidently tries to move his shoulder too much.    Pertinent History 2x clavicle fracture on Rt    Limitations Lifting;House hold activities;Writing    Diagnostic tests x ray shows fracture of Rt greater tuberosity and inferior glenoid    Patient Stated Goals reduce pain and improve  motion to return to IADLs and ADLs    Currently in Pain? Yes    Pain Score 4     Pain Location Shoulder    Pain Orientation Right    Pain Descriptors / Indicators Aching;Sore    Pain Type Acute pain    Pain Onset 1 to 4 weeks ago    Pain Frequency Intermittent    Aggravating Factors  movement, fatigue    Pain Relieving Factors meds              [facility] PT Assessment - 12/11/20 0001      Assessment   Medical Diagnosis fracture of Rt greater tuberosity and inferior glenoid    Referring Provider (PT) [email or web address]      Precautions   Precaution Comments sling d/c after 12/11/20      Balance Screen   Has the patient fallen in the past 6 months Yes    How many times? 1      Prior Function   Level of Independence Independent      Observation/Other Assessments   Focus on Therapeutic Outcomes (FOTO)  51 functional status measure      ROM / Strength   AROM / PROM / Strength PROM;Strength      PROM   PROM Assessment Site Shoulder  Right/Left Shoulder Right    Right Shoulder Flexion 96 Degrees    Right Shoulder ABduction 91 Degrees    Right Shoulder Internal Rotation 62 Degrees    Right Shoulder External Rotation 11 Degrees      Strength   Overall Strength Comments not assessed due to precautions      Palpation   Palpation comment no TTP shoulder complex                      Objective measurements completed on examination: See above findings.       [facility] Adult PT Treatment/Exercise - 12/11/20 0001      Exercises   Exercises Shoulder      Shoulder Exercises: ROM/Strengthening   Pendulum all directions x 10    Other ROM/Strengthening Exercises shoulder flexion PROM table slide    Other ROM/Strengthening Exercises elbow AROM x 10                  PT Education - 12/11/20 1426    Education Details HEP, PT POC and goals, education on precautions    Person(s) Educated Patient    Methods Explanation;Demonstration;Handout    Comprehension  Returned demonstration;Verbalized understanding               PT Long Term Goals - 12/11/20 1431      PT LONG TERM GOAL #1   Title Pt will be independent with HEP    Time 6    Period Weeks    Status New    Target Date 01/22/21      PT LONG TERM GOAL #2   Title Pt will improve FOTO to >= 75 to demo improved functional mobility    Time 6    Period Weeks    Status New    Target Date 01/22/21      PT LONG TERM GOAL #3   Title Pt will demonstrate 4/5 Rt shoulder strength to return to IADLs and leisure activities with decreased pain    Time 6    Period Weeks    Status New    Target Date 01/22/21      PT LONG TERM GOAL #4   Title Pt will improve Rt shoulder ROM to 150 degrees flexion, 150 degrees abduction and 60 degrees ER to perform ADLs and IADLs with decreased pain    Time 6    Period Weeks    Status New    Target Date 01/22/21                  Plan - 12/11/20 1427    Clinical Impression Statement Pt is a 35 y/o male who presents s/p Rt shoulder fracture with deficits in strength, ROM and functional use. Pt will benefit from skilled PT to address deficits and improve functional independence    Examination-Activity Limitations Hygiene/Grooming;Carry;Lift;Reach Overhead    Examination-Participation Restrictions Community Activity;Yard Work;Cleaning;Laundry    Stability/Clinical Decision Making Stable/Uncomplicated    Clinical Decision Making Low    Rehab Potential Good    PT Frequency 2x / week    PT Duration 6 weeks    PT Treatment/Interventions Cryotherapy;Moist Heat;Iontophoresis 4mg /ml Dexamethasone;Electrical Stimulation;ADLs/Self Care Home Management;Neuromuscular re-education;Therapeutic exercise;Therapeutic activities;Patient/family education;Taping;Manual techniques;Passive range of motion;Dry needling    PT Next Visit Plan assess HEP, add isometrics    PT Home Exercise Plan Access Code: [email or web address]    Consulted and Agree with Plan of Care Patient  Patient will benefit from skilled therapeutic intervention in order to improve the following deficits and impairments:  Pain,Impaired UE functional use,Decreased strength,Decreased range of motion  Visit Diagnosis: Acute pain of right shoulder - Plan: PT plan of care cert/re-cert  Muscle weakness (generalized) - Plan: PT plan of care cert/re-cert  Other symptoms and signs involving the musculoskeletal system - Plan: PT plan of care cert/re-cert  Localized edema - Plan: PT plan of care cert/re-cert     Problem List Patient Active Problem List   Diagnosis Date Noted  . Fracture of right greater tuberosity and inferior glenoid 11/12/2020  . Non-restorative sleep 10/20/2019  . Frequent headaches 10/20/2019  . Class 1 obesity due to excess calories with serious comorbidity and body mass index (BMI) of 32.0 to 32.9 in adult 10/20/2019  . 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 05/12/2019  . Hypertriglyceridemia 04/10/2019  . Perianal abscess 06/21/2017  . NAFL (nonalcoholic fatty liver) 05/25/2017  . Elevated ALT measurement 05/11/2017  . RUQ abdominal pain 05/10/2017  . Hypertension goal BP (blood pressure) < 130/80 04/09/2017  . Alcohol consumption of more than two drinks per day 03/12/2017  . Adult ADHD 03/12/2017  . Elevated blood pressure reading 03/12/2017  . Moderate episode of recurrent major depressive disorder (HCC) 03/12/2017  . Social anxiety disorder 03/12/2017  . Snoring 03/12/2017  . At risk for obstructive sleep apnea 03/12/2017  . Tobacco use 03/12/2017  . Back pain 02/08/2014   Iwao Shamblin, PT  Yael Coppess 12/11/2020, 2:52 PM  [facility] [place] [address] [place], [place], [address] Phone: [phone]   Fax:  [phone]  Name: Luby Seamans MRN: [id number] Date of Birth: September 22, 1985

## 2020-12-11 NOTE — Patient Instructions (Signed)
Access Code: [email or web address] URL: [email or web address] Date: 12/11/2020 Prepared by: Reggy Eye  Exercises Circular Shoulder Pendulum with Table Support - 1 x daily - 7 x weekly - 3 sets - 10 reps Horizontal Shoulder Pendulum with Table Support - 1 x daily - 7 x weekly - 3 sets - 10 reps Flexion-Extension Shoulder Pendulum with Table Support - 1 x daily - 7 x weekly - 3 sets - 10 reps Standing 'L' Stretch at Counter - 1 x daily - 7 x weekly - 3 sets - 10 reps Standing Elbow Flexion Extension AROM - 1 x daily - 7 x weekly - 3 sets - 10 reps

## 2020-12-25 ENCOUNTER — Ambulatory Visit (INDEPENDENT_AMBULATORY_CARE_PROVIDER_SITE_OTHER): Payer: BC Managed Care – PPO | Admitting: Physical Therapy

## 2020-12-25 ENCOUNTER — Other Ambulatory Visit: Payer: Self-pay

## 2020-12-25 DIAGNOSIS — R29898 Other symptoms and signs involving the musculoskeletal system: Secondary | ICD-10-CM | POA: Diagnosis not present

## 2020-12-25 DIAGNOSIS — M6281 Muscle weakness (generalized): Secondary | ICD-10-CM | POA: Diagnosis not present

## 2020-12-25 DIAGNOSIS — M25511 Pain in right shoulder: Secondary | ICD-10-CM | POA: Diagnosis not present

## 2020-12-25 DIAGNOSIS — R600 Localized edema: Secondary | ICD-10-CM | POA: Diagnosis not present

## 2020-12-25 NOTE — Patient Instructions (Signed)
Access Code: [email or web address] URL: [email or web address] Date: 12/25/2020 Prepared by: Reggy Eye  Exercises Circular Shoulder Pendulum with Table Support - 1 x daily - 7 x weekly - 3 sets - 10 reps Horizontal Shoulder Pendulum with Table Support - 1 x daily - 7 x weekly - 3 sets - 10 reps Flexion-Extension Shoulder Pendulum with Table Support - 1 x daily - 7 x weekly - 3 sets - 10 reps Standing 'L' Stretch at Counter - 1 x daily - 7 x weekly - 3 sets - 10 reps Supine Shoulder Flexion Extension AAROM with Dowel - 1 x daily - 7 x weekly - 2 sets - 10 reps Supine Shoulder Abduction AAROM with Dowel - 1 x daily - 7 x weekly - 2 sets - 10 reps Supine Shoulder External Rotation in 45 Degrees Abduction AAROM with Dowel - 1 x daily - 7 x weekly - 2 sets - 10 reps

## 2020-12-25 NOTE — Therapy (Signed)
[facility] [place] [address] [place], [place], [address] Phone: [phone]   Fax:  [phone]  Physical Therapy Treatment  Patient Details  Name: Luke Wilkerson MRN: [id number] Date of Birth: 1986-05-28 Referring Provider (PT): thekkekandam   Encounter Date: 12/25/2020   PT End of Session - 12/25/20 1505    Visit Number 2    Number of Visits 12    Date for PT Re-Evaluation 01/22/21    PT Start Time 1430    PT Stop Time 1515    PT Time Calculation (min) 45 min    Activity Tolerance Patient tolerated treatment well    Behavior During Therapy [facility] for tasks assessed/performed           Past Medical History:  Diagnosis Date  . ADHD   . Anxiety   . Elevated ALT measurement 05/11/2017   AST:ALT 0.5  . Hypertension   . Hypertriglyceridemia 04/10/2019  . Obesity   . Pneumonia 2016    Past Surgical History:  Procedure Laterality Date  . EVALUATION UNDER ANESTHESIA WITH HEMORRHOIDECTOMY N/A 05/12/2019   Procedure: ANORECTAL EXAM UNDER ANESTHESIA WITH LIFT REPAIR;  Surgeon: Karie Soda, MD;  Location: [facility];  Service: General;  Laterality: N/A;  . FRACTURE SURGERY Right 2008   Rt knee  . FRACTURE SURGERY Right 2016   Rt wrist from old injury  . LEG SURGERY    . WRIST ARTHROSCOPY      There were no vitals filed for this visit.   Subjective Assessment - 12/25/20 1432    Subjective Pt states "I might have been doing a little more than I should have". States his shoulder feels "stiff"    Patient Stated Goals reduce pain and improve motion to return to IADLs and ADLs    Currently in Pain? Yes    Pain Score 4     Pain Location Shoulder    Pain Orientation Right    Pain Descriptors / Indicators Aching;Sore    Pain Type Acute pain                             [facility] Adult PT Treatment/Exercise - 12/25/20 0001      Shoulder Exercises: Seated   Retraction 20 reps      Shoulder Exercises:  ROM/Strengthening   Pendulum all directions x 10    Other ROM/Strengthening Exercises AAROM with cane flexion, abduction, ER all x10      Shoulder Exercises: Isometric Strengthening   External Rotation 5X5"    External Rotation Limitations reactive isometrics    Internal Rotation 5X5"    Internal Rotation Limitations reactive isometrics      Modalities   Modalities Vasopneumatic      Vasopneumatic   Number Minutes Vasopneumatic  10 minutes    Vasopnuematic Location  Shoulder    Vasopneumatic Pressure Low    Vasopneumatic Temperature  34      Manual Therapy   Manual Therapy Passive ROM    Passive ROM Rt shoulder ER, flexion and abduction                  PT Education - 12/25/20 1505    Education Details updated HEP    Person(s) Educated Patient    Methods Explanation;Demonstration;Handout    Comprehension Returned demonstration;Verbalized understanding               PT Long Term Goals - 12/11/20 1431  PT LONG TERM GOAL #1   Title Pt will be independent with HEP    Time 6    Period Weeks    Status New    Target Date 01/22/21      PT LONG TERM GOAL #2   Title Pt will improve FOTO to >= 75 to demo improved functional mobility    Time 6    Period Weeks    Status New    Target Date 01/22/21      PT LONG TERM GOAL #3   Title Pt will demonstrate 4/5 Rt shoulder strength to return to IADLs and leisure activities with decreased pain    Time 6    Period Weeks    Status New    Target Date 01/22/21      PT LONG TERM GOAL #4   Title Pt will improve Rt shoulder ROM to 150 degrees flexion, 150 degrees abduction and 60 degrees ER to perform ADLs and IADLs with decreased pain    Time 6    Period Weeks    Status New    Target Date 01/22/21                 Plan - 12/25/20 1506    Clinical Impression Statement Pt continues with decreased Rt shoulder ROM especially into ER. Pt with good tolerance of AAROM with cane and reactive isometrics. will  continue to benefit from aggressive PROM to improve ROM    PT Next Visit Plan manual for PROM, progress isometrics, AAROM    PT Home Exercise Plan Access Code: [email or web address]    Consulted and Agree with Plan of Care Patient           Patient will benefit from skilled therapeutic intervention in order to improve the following deficits and impairments:     Visit Diagnosis: Acute pain of right shoulder  Muscle weakness (generalized)  Other symptoms and signs involving the musculoskeletal system  Localized edema     Problem List Patient Active Problem List   Diagnosis Date Noted  . Fracture of right greater tuberosity and inferior glenoid 11/12/2020  . Non-restorative sleep 10/20/2019  . Frequent headaches 10/20/2019  . Class 1 obesity due to excess calories with serious comorbidity and body mass index (BMI) of 32.0 to 32.9 in adult 10/20/2019  . 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 05/12/2019  . Hypertriglyceridemia 04/10/2019  . Perianal abscess 06/21/2017  . NAFL (nonalcoholic fatty liver) 05/25/2017  . Elevated ALT measurement 05/11/2017  . RUQ abdominal pain 05/10/2017  . Hypertension goal BP (blood pressure) < 130/80 04/09/2017  . Alcohol consumption of more than two drinks per day 03/12/2017  . Adult ADHD 03/12/2017  . Elevated blood pressure reading 03/12/2017  . Moderate episode of recurrent major depressive disorder (HCC) 03/12/2017  . Social anxiety disorder 03/12/2017  . Snoring 03/12/2017  . At risk for obstructive sleep apnea 03/12/2017  . Tobacco use 03/12/2017  . Back pain 02/08/2014   Isami Mehra, PT  Huel Centola 12/25/2020, 3:09 PM  [facility] [address] [place], [place], [address] Phone: [phone]   Fax:  [phone]  Name: Hai Grabe MRN: [id number] Date of Birth: June 30, 1986

## 2020-12-30 ENCOUNTER — Telehealth: Payer: Self-pay | Admitting: Neurology

## 2020-12-30 NOTE — Telephone Encounter (Signed)
Patient called and left vm that his Adderall has no refills. Called and explained patient needs to contact pharmacy to get this filled and talk to a person at the pharmacy as it is sent in three different prescriptions. He will call with any further questions.

## 2021-01-01 ENCOUNTER — Encounter: Payer: Self-pay | Admitting: Physical Therapy

## 2021-01-01 ENCOUNTER — Other Ambulatory Visit: Payer: Self-pay

## 2021-01-01 ENCOUNTER — Ambulatory Visit (INDEPENDENT_AMBULATORY_CARE_PROVIDER_SITE_OTHER): Payer: BC Managed Care – PPO | Admitting: Physical Therapy

## 2021-01-01 DIAGNOSIS — M6281 Muscle weakness (generalized): Secondary | ICD-10-CM

## 2021-01-01 DIAGNOSIS — M25511 Pain in right shoulder: Secondary | ICD-10-CM | POA: Diagnosis not present

## 2021-01-01 DIAGNOSIS — R29898 Other symptoms and signs involving the musculoskeletal system: Secondary | ICD-10-CM

## 2021-01-01 DIAGNOSIS — R600 Localized edema: Secondary | ICD-10-CM

## 2021-01-01 NOTE — Therapy (Signed)
[facility] [place] [address] [place] [address] [place], [place], [address] Phone: [phone]   Fax:  [phone]  Physical Therapy Treatment  Patient Details  Name: Luke Wilkerson MRN: [id number] Date of Birth: 08-01-1986 Referring Provider (PT): thekkekandam   Encounter Date: 01/01/2021   PT End of Session - 01/01/21 1441    Visit Number 3    Number of Visits 12    Date for PT Re-Evaluation 01/22/21    PT Start Time 1435    PT Stop Time 1520    PT Time Calculation (min) 45 min    Activity Tolerance Patient tolerated treatment well    Behavior During Therapy [facility] for tasks assessed/performed           Past Medical History:  Diagnosis Date  . ADHD   . Anxiety   . Elevated ALT measurement 05/11/2017   AST:ALT 0.5  . Hypertension   . Hypertriglyceridemia 04/10/2019  . Obesity   . Pneumonia 2016    Past Surgical History:  Procedure Laterality Date  . EVALUATION UNDER ANESTHESIA WITH HEMORRHOIDECTOMY N/A 05/12/2019   Procedure: ANORECTAL EXAM UNDER ANESTHESIA WITH LIFT REPAIR;  Surgeon: Karie Soda, MD;  Location: [facility];  Service: General;  Laterality: N/A;  . FRACTURE SURGERY Right 2008   Rt knee  . FRACTURE SURGERY Right 2016   Rt wrist from old injury  . LEG SURGERY    . WRIST ARTHROSCOPY      There were no vitals filed for this visit.   Subjective Assessment - 01/01/21 1441    Subjective Pt reports he mowed lawn (riding lawnmower), blew leaves(using Rt arm ~10 min), and changed oil yesterday. It hurt a little more afterwards.  He reports the doctor hasn't given him any restrictions. He reports he has been doing the cane exercises.    Currently in Pain? Yes    Pain Score 5/10 - R shoulder - worse with overhead motion and use.              [facility] PT Assessment - 01/01/21 0001      Assessment   Medical Diagnosis fracture of Rt greater tuberosity and inferior glenoid    Referring Provider (PT) thekkekandam    Next MD  Visit 01/07/21      PROM   Overall PROM Comments AAROM with cane in supine    Right Shoulder Flexion 147 Degrees    Right Shoulder External Rotation 53 Degrees   supine, supported scaption of ~80 deg           [facility] Adult PT Treatment/Exercise - 01/01/21 0001      Shoulder Exercises: Supine   Horizontal ABduction 5 reps;Right   with cane AAROM   External Rotation AAROM;Right;10 reps   supported scaption, with cane   External Rotation Limitations cues to stay in painfree range    Flexion AAROM;10 reps;Both   cane   ABduction Right;AAROM;5 reps   cane     Shoulder Exercises: Standing   Extension AAROM;Both;10 reps   cane behind back   Other Standing Exercises scap retraction with back against noodle x 5 sec x 10 reps      Shoulder Exercises: Pulleys   Flexion --   10 reps; cues for technique     Shoulder Exercises: Isometric Strengthening   Flexion 5X5"    Extension 5X5"    External Rotation 5X5"    External Rotation Limitations reported pain afterwards    Internal Rotation 5X5"    ABduction 5X5"  Shoulder Exercises: Stretch   Other Shoulder Stretches trial of shoulder ext stretch holding counter x 10 sec x 2 reps (difficulty with form despite cues - switched to cane ext)      Modalities   Modalities Vasopneumatic      Vasopneumatic   Number Minutes Vasopneumatic  10 minutes    Vasopnuematic Location  Shoulder   Rt   Vasopneumatic Pressure Medium    Vasopneumatic Temperature  34                  PT Education - 01/01/21 1545    Education Details updated HEP    Person(s) Educated Patient    Methods Explanation;Demonstration;Verbal cues;Handout    Comprehension Verbalized understanding;Returned demonstration               PT Long Term Goals - 12/11/20 1431      PT LONG TERM GOAL #1   Title Pt will be independent with HEP    Time 6    Period Weeks    Status New    Target Date 01/22/21      PT LONG TERM GOAL #2   Title Pt will improve FOTO  to >= 75 to demo improved functional mobility    Time 6    Period Weeks    Status New    Target Date 01/22/21      PT LONG TERM GOAL #3   Title Pt will demonstrate 4/5 Rt shoulder strength to return to IADLs and leisure activities with decreased pain    Time 6    Period Weeks    Status New    Target Date 01/22/21      PT LONG TERM GOAL #4   Title Pt will improve Rt shoulder ROM to 150 degrees flexion, 150 degrees abduction and 60 degrees ER to perform ADLs and IADLs with decreased pain    Time 6    Period Weeks    Status New    Target Date 01/22/21                 Plan - 01/01/21 1547    Clinical Impression Statement Pt reported increased pain in Rt shoulder with stretches into available end range and ER isometric. Encouraged pt to complete exercises in pain-free range.  Pt's Rt shoulder PROM has improved.  Progressing towards goals.    Rehab Potential Good    PT Frequency 2x / week    PT Duration 6 weeks    PT Treatment/Interventions Cryotherapy;Moist Heat;Iontophoresis 4mg /ml Dexamethasone;Electrical Stimulation;ADLs/Self Care Home Management;Neuromuscular re-education;Therapeutic exercise;Therapeutic activities;Patient/family education;Taping;Manual techniques;Passive range of motion;Dry needling    PT Next Visit Plan manual for PROM, add AAROM for IR, to tolerance.    PT Home Exercise Plan Access Code: [email or web address]    Consulted and Agree with Plan of Care Patient           Patient will benefit from skilled therapeutic intervention in order to improve the following deficits and impairments:  Pain,Impaired UE functional use,Decreased strength,Decreased range of motion  Visit Diagnosis: Acute pain of right shoulder  Muscle weakness (generalized)  Other symptoms and signs involving the musculoskeletal system  Localized edema     Problem List Patient Active Problem List   Diagnosis Date Noted  . Fracture of right greater tuberosity and inferior glenoid  11/12/2020  . Non-restorative sleep 10/20/2019  . Frequent headaches 10/20/2019  . Class 1 obesity due to excess calories with serious comorbidity and body mass index (BMI) of 32.0 to  32.9 in adult 10/20/2019  . 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 05/12/2019  . Hypertriglyceridemia 04/10/2019  . Perianal abscess 06/21/2017  . NAFL (nonalcoholic fatty liver) 05/25/2017  . Elevated ALT measurement 05/11/2017  . RUQ abdominal pain 05/10/2017  . Hypertension goal BP (blood pressure) < 130/80 04/09/2017  . Alcohol consumption of more than two drinks per day 03/12/2017  . Adult ADHD 03/12/2017  . Elevated blood pressure reading 03/12/2017  . Moderate episode of recurrent major depressive disorder (HCC) 03/12/2017  . Social anxiety disorder 03/12/2017  . Snoring 03/12/2017  . At risk for obstructive sleep apnea 03/12/2017  . Tobacco use 03/12/2017  . Back pain 02/08/2014   Mayer Camel, PTA 01/01/21 3:49 PM  [facility] [place] [address] [place] [address] [place], [place], [address] Phone: [phone]   Fax:  [phone]  Name: Luke Wilkerson MRN: [id number] Date of Birth: 04/13/1986

## 2021-01-01 NOTE — Patient Instructions (Signed)
Access Code: [email or web address] URL: [email or web address] Date: 01/01/2021 Prepared by: [facility] Notes Exercises should be pain-free.     Exercises Standing 'L' Stretch at Counter - 2 x daily - 7 x weekly - 1 sets - 3 reps - 10-15 seconds hold Standing Shoulder Extension with Dowel - 1 x daily - 7 x weekly - 1 sets - 10 reps Supine Shoulder Flexion Extension AAROM with Dowel - 1 x daily - 7 x weekly - 2 sets - 10 reps Supine Shoulder Abduction AAROM with Dowel - 1 x daily - 7 x weekly - 2 sets - 10 reps Supine Shoulder External Rotation in 45 Degrees Abduction AAROM with Dowel - 1 x daily - 7 x weekly - 2 sets - 10 reps Standing Isometric Shoulder Internal Rotation at Doorway - 2 x daily - 7 x weekly - 1 sets - 10 reps - 5-10 hold Isometric Shoulder Abduction at Wall - 2 x daily - 7 x weekly - 1 sets - 10 reps - 5-10 hold Isometric Shoulder Flexion at Wall - 2 x daily - 7 x weekly - 1 sets - 10 reps - 5-10 hold Isometric Shoulder Extension at Wall - 2 x daily - 7 x weekly - 1 sets - 10 reps - 5-10 hold Seated Scapular Retraction - 2 x daily - 7 x weekly - 1 sets - 5-10 reps - 10 second hold

## 2021-01-03 ENCOUNTER — Other Ambulatory Visit: Payer: Self-pay

## 2021-01-03 ENCOUNTER — Ambulatory Visit (INDEPENDENT_AMBULATORY_CARE_PROVIDER_SITE_OTHER): Payer: BC Managed Care – PPO | Admitting: Physical Therapy

## 2021-01-03 DIAGNOSIS — M6281 Muscle weakness (generalized): Secondary | ICD-10-CM

## 2021-01-03 DIAGNOSIS — R29898 Other symptoms and signs involving the musculoskeletal system: Secondary | ICD-10-CM

## 2021-01-03 DIAGNOSIS — R600 Localized edema: Secondary | ICD-10-CM

## 2021-01-03 DIAGNOSIS — M25511 Pain in right shoulder: Secondary | ICD-10-CM | POA: Diagnosis not present

## 2021-01-03 NOTE — Therapy (Signed)
[facility] Outpatient Rehabilitation [place] [address] [place], [place], [address] Phone: [phone]   Fax:  [phone]  Physical Therapy Treatment  Patient Details  Name: Luke Wilkerson MRN: [id number] Date of Birth: [birth year] Referring Provider (PT): thekkekandam   Encounter Date: 01/03/2021   PT End of Session - 01/03/21 1355    Visit Number 4    Number of Visits 12    Date for PT Re-Evaluation 01/22/21    PT Start Time 1320    PT Stop Time 1403    PT Time Calculation (min) 43 min    Activity Tolerance Patient tolerated treatment well    Behavior During Therapy [facility] for tasks assessed/performed           Past Medical History:  Diagnosis Date  . ADHD   . Anxiety   . Elevated ALT measurement 05/11/2017   AST:ALT 0.5  . Hypertension   . Hypertriglyceridemia 04/10/2019  . Obesity   . Pneumonia 2016    Past Surgical History:  Procedure Laterality Date  . EVALUATION UNDER ANESTHESIA WITH HEMORRHOIDECTOMY N/A 05/12/2019   Procedure: ANORECTAL EXAM UNDER ANESTHESIA WITH LIFT REPAIR;  Surgeon: Karie Soda, MD;  Location: [facility];  Service: General;  Laterality: N/A;  . FRACTURE SURGERY Right 2008   Rt knee  . FRACTURE SURGERY Right 2016   Rt wrist from old injury  . LEG SURGERY    . WRIST ARTHROSCOPY      There were no vitals filed for this visit.   Subjective Assessment - 01/03/21 1324    Subjective It hurts all the time, even when I'm not using it. "I have been doing what I have to"    Patient Stated Goals reduce pain and improve motion to return to IADLs and ADLs    Currently in Pain? Yes    Pain Score 5     Pain Location Shoulder    Pain Orientation Right    Pain Descriptors / Indicators Sore;Aching    Pain Type Acute pain                             [facility] Adult PT Treatment/Exercise - 01/03/21 0001      Shoulder Exercises: Supine   Protraction 15 reps;Right    Protraction Limitations serratus punch     Horizontal ABduction 10 reps;Right;AROM    External Rotation Right;AAROM;10 reps   cues for technique   Flexion AAROM;10 reps   cane     Shoulder Exercises: Pulleys   Flexion 2 minutes    ABduction 2 minutes      Shoulder Exercises: Isometric Strengthening   Flexion 5X5"    Flexion Limitations reactive isometric red TB    Extension 5X5"    Extension Limitations reactive isometric red TB    External Rotation 5X5"    External Rotation Limitations reactive isometric red TB    Internal Rotation 5X5"    Internal Rotation Limitations reactive isometric red TB    ABduction 5X5"    ABduction Limitations reactive isometric red TB      Vasopneumatic   Number Minutes Vasopneumatic  10 minutes    Vasopnuematic Location  Shoulder    Vasopneumatic Pressure Low    Vasopneumatic Temperature  34      Manual Therapy   Manual Therapy Joint mobilization    Joint Mobilization inferior jt mobs to improve abduction    Passive ROM Rt shoulder ER, IR, abduction, flexion  PT Long Term Goals - 12/11/20 1431      PT LONG TERM GOAL #1   Title Pt will be independent with HEP    Time 6    Period Weeks    Status New    Target Date 01/22/21      PT LONG TERM GOAL #2   Title Pt will improve FOTO to >= 75 to demo improved functional mobility    Time 6    Period Weeks    Status New    Target Date 01/22/21      PT LONG TERM GOAL #3   Title Pt will demonstrate 4/5 Rt shoulder strength to return to IADLs and leisure activities with decreased pain    Time 6    Period Weeks    Status New    Target Date 01/22/21      PT LONG TERM GOAL #4   Title Pt will improve Rt shoulder ROM to 150 degrees flexion, 150 degrees abduction and 60 degrees ER to perform ADLs and IADLs with decreased pain    Time 6    Period Weeks    Status New    Target Date 01/22/21                 Plan - 01/03/21 1355    Clinical Impression Statement Pt continues with pain with ER and  shoulder abduction. PT continues to encourage pt to perform stretching at home to improve ROM    PT Next Visit Plan manual for PROM, add AAROM for IR, to tolerance, scapular strength    PT Home Exercise Plan Access Code: [email or web address]    Consulted and Agree with Plan of Care Patient           Patient will benefit from skilled therapeutic intervention in order to improve the following deficits and impairments:     Visit Diagnosis: Acute pain of right shoulder  Muscle weakness (generalized)  Other symptoms and signs involving the musculoskeletal system  Localized edema     Problem List Patient Active Problem List   Diagnosis Date Noted  . Fracture of right greater tuberosity and inferior glenoid 11/12/2020  . Non-restorative sleep 10/20/2019  . Frequent headaches 10/20/2019  . Class 1 obesity due to excess calories with serious comorbidity and body mass index (BMI) of 32.0 to 32.9 in adult 10/20/2019  . 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 05/12/2019  . Hypertriglyceridemia 04/10/2019  . Perianal abscess 06/21/2017  . NAFL (nonalcoholic fatty liver) 05/25/2017  . Elevated ALT measurement 05/11/2017  . RUQ abdominal pain 05/10/2017  . Hypertension goal BP (blood pressure) < 130/80 04/09/2017  . Alcohol consumption of more than two drinks per day 03/12/2017  . Adult ADHD 03/12/2017  . Elevated blood pressure reading 03/12/2017  . Moderate episode of recurrent major depressive disorder (HCC) 03/12/2017  . Social anxiety disorder 03/12/2017  . Snoring 03/12/2017  . At risk for obstructive sleep apnea 03/12/2017  . Tobacco use 03/12/2017  . Back pain 02/08/2014   Lilymarie Scroggins, PT  Eula Jaster 01/03/2021, 1:57 PM  [facility] [address] [place] [address] [place], [place], [address] Phone: [phone]   Fax:  [phone]  Name: Reyce Lubeck MRN: [id number] Date of Birth: [birth year]

## 2021-01-07 ENCOUNTER — Ambulatory Visit: Payer: BC Managed Care – PPO | Admitting: Sports Medicine

## 2021-01-08 ENCOUNTER — Ambulatory Visit (INDEPENDENT_AMBULATORY_CARE_PROVIDER_SITE_OTHER): Payer: BC Managed Care – PPO | Admitting: Physical Therapy

## 2021-01-08 ENCOUNTER — Other Ambulatory Visit: Payer: Self-pay

## 2021-01-08 DIAGNOSIS — M6281 Muscle weakness (generalized): Secondary | ICD-10-CM

## 2021-01-08 DIAGNOSIS — R29898 Other symptoms and signs involving the musculoskeletal system: Secondary | ICD-10-CM

## 2021-01-08 DIAGNOSIS — M25511 Pain in right shoulder: Secondary | ICD-10-CM

## 2021-01-08 DIAGNOSIS — R600 Localized edema: Secondary | ICD-10-CM

## 2021-01-08 NOTE — Patient Instructions (Signed)
Access Code: [email or web address] URL: [email or web address] Date: 01/08/2021 Prepared by: [facility]  Program Notes Exercises should be pain-free.     Exercises Supine Shoulder External Rotation Stretch - 1 x daily - 7 x weekly - 3 sets - 1 reps - 3-5 minutes hold Shoulder External Rotation and Scapular Retraction with Resistance - 1 x daily - 7 x weekly - 3 sets - 10 reps Drawing Bow - 1 x daily - 7 x weekly - 3 sets - 10 reps Supine Scapular Protraction in Flexion with Dumbbells - 1 x daily - 7 x weekly - 3 sets - 10 reps

## 2021-01-08 NOTE — Therapy (Addendum)
[place], [place], [address] Phone: [phone]   Fax:  [phone]  Physical Therapy Treatment and Discharge  Patient Details  Name: Luke Wilkerson MRN: [id number] Date of Birth: Sep 16, 1985 Referring Provider (PT): thekkekandam   Encounter Date: 01/08/2021   PT End of Session - 01/08/21 1228    Visit Number 5    Number of Visits 12    Date for PT Re-Evaluation 01/22/21    PT Start Time 1219    PT Stop Time 1228    PT Time Calculation (min) 40 min    Activity Tolerance Patient tolerated treatment well    Behavior During Therapy [facility] for tasks assessed/performed           Past Medical History:  Diagnosis Date  . ADHD   . Anxiety   . Elevated ALT measurement 05/11/2017   AST:ALT 0.5  . Hypertension   . Hypertriglyceridemia 04/10/2019  . Obesity   . Pneumonia 2016    Past Surgical History:  Procedure Laterality Date  . EVALUATION UNDER ANESTHESIA WITH HEMORRHOIDECTOMY N/A 05/12/2019   Procedure: ANORECTAL EXAM UNDER ANESTHESIA WITH LIFT REPAIR;  Surgeon: Michael Boston, MD;  Location: [facility];  Service: General;  Laterality: N/A;  . FRACTURE SURGERY Right 2008   Rt knee  . FRACTURE SURGERY Right 2016   Rt wrist from old injury  . LEG SURGERY    . WRIST ARTHROSCOPY      There were no vitals filed for this visit.   Subjective Assessment - 01/08/21 1156    Subjective pt reached up to get ice out of the ice maker and that hurt his shoulder. overall pain is " a little less"    Patient Stated Goals reduce pain and improve motion to return to IADLs and ADLs    Currently in Pain? Yes    Pain Score 1     Pain Location Shoulder    Pain Orientation Right    Pain Descriptors / Indicators Aching              [facility] PT Assessment - 01/08/21 0001      Assessment   Medical Diagnosis fracture of Rt greater tuberosity and inferior glenoid    Referring Provider (PT) thekkekandam      ROM /  Strength   AROM / PROM / Strength AROM      AROM   AROM Assessment Site Shoulder    Right/Left Shoulder Right    Right Shoulder Flexion 147 Degrees    Right Shoulder ABduction 165 Degrees    Right Shoulder Internal Rotation 80 Degrees    Right Shoulder External Rotation 41 Degrees                         [facility] Adult PT Treatment/Exercise - 01/08/21 0001      Shoulder Exercises: Standing   Row Strengthening;20 reps    Theraband Level (Shoulder Row) Level 3 (Green)    Other Standing Exercises bow and arrow x 10 green TB      Shoulder Exercises: Pulleys   Flexion 2 minutes      Shoulder Exercises: Isometric Strengthening   Flexion 5X5"    Flexion Limitations reactive isometric red TB    Extension 5X5"    Extension Limitations reactive isometric red TB    External Rotation 5X5"    External Rotation Limitations reactive isometric red TB    Internal Rotation 5X5"  Internal Rotation Limitations reactive isometric red TB    ABduction 5X5"    ABduction Limitations reactive isometric red TB    ADduction 5X5"    ADduction Limitations reactive isometrics red TB                  PT Education - 01/08/21 1228    Education Details updated HEP    Person(s) Educated Patient    Methods Explanation;Demonstration;Handout    Comprehension Verbalized understanding;Returned demonstration               PT Long Term Goals - 01/08/21 1232      PT LONG TERM GOAL #1   Title Pt will be independent with HEP    Status On-going    Target Date 01/22/21      PT LONG TERM GOAL #3   Title Pt will demonstrate 4/5 Rt shoulder strength to return to IADLs and leisure activities with decreased pain    Status Partially Met    Target Date 01/22/21      PT LONG TERM GOAL #4   Title Pt will improve Rt shoulder ROM to 150 degrees flexion, 150 degrees abduction and 60 degrees ER to perform ADLs and IADLs with decreased pain    Baseline ER 41 degrees    Target Date 01/22/21                  Plan - 01/08/21 1230    Clinical Impression Statement Pt has improved AROM of Rt shoulder and is limited mostly in ER. Pt with improved Rt shoulder strength and endurance. HEP updated and pt to return to MD this week.    PT Next Visit Plan per MD    PT Home Exercise Plan Access Code: [email or web address]    Consulted and Agree with Plan of Care Patient           Patient will benefit from skilled therapeutic intervention in order to improve the following deficits and impairments:     Visit Diagnosis: Acute pain of right shoulder  Muscle weakness (generalized)  Other symptoms and signs involving the musculoskeletal system  Localized edema     Problem List Patient Active Problem List   Diagnosis Date Noted  . Fracture of right greater tuberosity and inferior glenoid 11/12/2020  . Non-restorative sleep 10/20/2019  . Frequent headaches 10/20/2019  . Class 1 obesity due to excess calories with serious comorbidity and body mass index (BMI) of 32.0 to 32.9 in adult 10/20/2019  . 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 05/12/2019  . Hypertriglyceridemia 04/10/2019  . Perianal abscess 06/21/2017  . NAFL (nonalcoholic fatty liver) 26/41/5830  . Elevated ALT measurement 05/11/2017  . RUQ abdominal pain 05/10/2017  . Hypertension goal BP (blood pressure) < 130/80 04/09/2017  . Alcohol consumption of more than two drinks per day 03/12/2017  . Adult ADHD 03/12/2017  . Elevated blood pressure reading 03/12/2017  . Moderate episode of recurrent major depressive disorder ([place]) 03/12/2017  . Social anxiety disorder 03/12/2017  . Snoring 03/12/2017  . At risk for obstructive sleep apnea 03/12/2017  . Tobacco use 03/12/2017  . Back pain 02/08/2014   PHYSICAL THERAPY DISCHARGE SUMMARY  Visits from Start of Care: 5  Current functional level related to goals / functional outcomes: Pt with improving ROM and strength and increased activity tolerance   Remaining  deficits: See above   Education / Equipment: HEP Plan: Patient agrees to discharge.  Patient goals were partially met. Patient is being discharged due  to being pleased with the current functional level.  ?????     Isabelle Course, PT,DPT06/08/2208:57 AM  Isabelle Course, PT  Sarah-Jane Nazario 01/08/2021, 12:34 PM  [facility] [place], [place], [address] Phone: [phone]   Fax:  [phone]  Name: Luke Wilkerson MRN: [id number] Date of Birth: [birth year]

## 2021-01-09 ENCOUNTER — Encounter: Payer: BC Managed Care – PPO | Admitting: Physical Therapy

## 2021-01-10 ENCOUNTER — Other Ambulatory Visit: Payer: Self-pay

## 2021-01-10 ENCOUNTER — Ambulatory Visit: Payer: BC Managed Care – PPO | Admitting: Sports Medicine

## 2021-01-10 ENCOUNTER — Ambulatory Visit (INDEPENDENT_AMBULATORY_CARE_PROVIDER_SITE_OTHER): Payer: BC Managed Care – PPO

## 2021-01-10 DIAGNOSIS — S4991XD Unspecified injury of right shoulder and upper arm, subsequent encounter: Secondary | ICD-10-CM | POA: Diagnosis not present

## 2021-01-10 DIAGNOSIS — M25511 Pain in right shoulder: Secondary | ICD-10-CM | POA: Diagnosis not present

## 2021-01-10 DIAGNOSIS — S42201A Unspecified fracture of upper end of right humerus, initial encounter for closed fracture: Secondary | ICD-10-CM | POA: Diagnosis not present

## 2021-01-10 NOTE — Progress Notes (Signed)
    Procedures performed today:    None.  Independent interpretation of notes and tests performed by another provider:   None.  Brief History, Exam, Impression, and Recommendations:    Fracture of right greater tuberosity and inferior glenoid [name] returns, he is about 8 to 10 weeks post fracture of his right greater tuberosity and inferior glenoid. He actually had adopted a cat, tripped over it and fell down the stairs and subluxed his shoulder. He has been doing formal physical therapy and reports about 70% improvement in pain and 90% improvement in range of motion, he still has significant difficulty with abduction and external rotation. I would like to get some updated x-rays today, he will do his own home exercise program for the next month and if insufficient improvement we will proceed with MR arthrography and likely referral to Dr. Everardo Pacific for what I suspect will be a SLAP tear. Return to see me in 4 weeks.    ___________________________________________ Ihor Austin. Benjamin Stain, M.D., ABFM., CAQSM. Primary Care and Sports Medicine [place] [facility]  Adjunct Instructor of Family Medicine  [employer]

## 2021-01-10 NOTE — Assessment & Plan Note (Signed)
Luke Wilkerson returns, he is about 8 to 10 weeks post fracture of his right greater tuberosity and inferior glenoid. He actually had adopted a cat, tripped over it and fell down the stairs and subluxed his shoulder. He has been doing formal physical therapy and reports about 70% improvement in pain and 90% improvement in range of motion, he still has significant difficulty with abduction and external rotation. I would like to get some updated x-rays today, he will do his own home exercise program for the next month and if insufficient improvement we will proceed with MR arthrography and likely referral to Dr. Everardo Pacific for what I suspect will be a SLAP tear. Return to see me in 4 weeks.

## 2021-02-07 ENCOUNTER — Ambulatory Visit: Payer: BC Managed Care – PPO | Admitting: Sports Medicine

## 2021-02-19 ENCOUNTER — Encounter: Payer: Self-pay | Admitting: Physician Assistant

## 2021-02-19 ENCOUNTER — Other Ambulatory Visit: Payer: Self-pay

## 2021-02-19 ENCOUNTER — Ambulatory Visit: Payer: BC Managed Care – PPO | Admitting: Physician Assistant

## 2021-02-19 VITALS — BP 141/91 | HR 71 | Ht 71.0 in | Wt 227.0 lb

## 2021-02-19 DIAGNOSIS — Z79899 Other long term (current) drug therapy: Secondary | ICD-10-CM | POA: Diagnosis not present

## 2021-02-19 DIAGNOSIS — I10 Essential (primary) hypertension: Secondary | ICD-10-CM

## 2021-02-19 DIAGNOSIS — Z720 Tobacco use: Secondary | ICD-10-CM | POA: Diagnosis not present

## 2021-02-19 DIAGNOSIS — F909 Attention-deficit hyperactivity disorder, unspecified type: Secondary | ICD-10-CM

## 2021-02-19 MED ORDER — BUPROPION HCL ER (SR) 100 MG PO TB12: 100.0000 mg | ORAL_TABLET | Freq: Two times a day (BID) | ORAL | 0 refills | Status: DC

## 2021-02-19 NOTE — Progress Notes (Signed)
Subjective:    Patient ID: Luke Wilkerson, male    DOB: [birth year], 35 y.o.   MRN: [id number]  HPI Pt is a 35 yo male with ADHD and HTN who presents to the clinic for refills.   He admits he does not take is norvasc daily. Denies any CP, palpitations, headaches, or vision changes. He does not check his BP. He admits to smoking and drinking alcohol regularly.   Adderall is helping and he is doing well. No concerns or complaints. He does not take every day but most days during the week.   .. Active Ambulatory Problems    Diagnosis Date Noted   Back pain 02/08/2014   Alcohol consumption of more than two drinks per day 03/12/2017   Adult ADHD 03/12/2017   Elevated blood pressure reading 03/12/2017   Moderate episode of recurrent major depressive disorder (HCC) 03/12/2017   Social anxiety disorder 03/12/2017   Snoring 03/12/2017   At risk for obstructive sleep apnea 03/12/2017   Tobacco use 03/12/2017   Hypertension goal BP (blood pressure) < 130/80 04/09/2017   RUQ abdominal pain 05/10/2017   Elevated ALT measurement 05/11/2017   NAFL (nonalcoholic fatty liver) 05/25/2017   Perianal abscess 06/21/2017   Hypertriglyceridemia 04/10/2019   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     Objective:   Physical Exam Vitals reviewed.  Constitutional:      Appearance: Normal appearance. He is obese.  HENT:     Head: Normocephalic.   Neck:     Vascular: No carotid bruit.  Cardiovascular:     Rate and Rhythm: Normal rate and regular rhythm.     Pulses: Normal pulses.     Heart sounds: Normal heart sounds.  Neurological:     General: No focal deficit present.     Mental Status: He is alert and oriented to person, place, and time.  Psychiatric:        Mood and Affect: Mood normal.        Behavior: Behavior normal.          Assessment & Plan:  Marland KitchenMarland KitchenAldous was seen today for adhd.  Diagnoses and all orders for this visit:  Adult ADHD  Medication management -     COMPLETE METABOLIC PANEL WITH GFR  Hypertension goal BP (blood pressure) < 130/80 -     COMPLETE METABOLIC PANEL WITH GFR  Tobacco use -     buPROPion (WELLBUTRIN SR) 100 MG 12 hr tablet; Take 1 tablet (100 mg total) by mouth 2 (two) times daily.  Discussed in detail BP control and need to take norvasc especially while on adderall. Start taking norvasc daily. Recheck BP in 2 weeks.  BP did go down in office minimally on 2nd recheck.  Smoking and drinking alcohol can also effect.  Discussed tapering off cigarettes. Agreed to start wellbutrin to help.  Discussed side effects.  HO on cessation and cravings given.  Limit alcohol consumption.   Recheck in 2 weeks to get Adderall script.

## 2021-02-19 NOTE — Patient Instructions (Signed)
Managing the Challenge of Quitting Smoking Quitting smoking is a physical and mental challenge. You will face cravings, withdrawal symptoms, and temptation. Before quitting, work with your health care provider to make a plan that can help you manage quitting. Preparation canhelp you quit and keep you from giving in. How to manage lifestyle changes Managing stress Stress can make you want to smoke, and wanting to smoke may cause stress. It is important to find ways to manage your stress. You might try some of the following: Practice relaxation techniques. Breathe slowly and deeply, in through your nose and out through your mouth. Listen to music. Soak in a bath or take a shower. Imagine a peaceful place or vacation. Get some support. Talk with family or friends about your stress. Join a support group. Talk with a counselor or therapist. Get some physical activity. Go for a walk, run, or bike ride. Play a favorite sport. Practice yoga.  Medicines Talk with your health care provider about medicines that might help you dealwith cravings and make quitting easier for you. Relationships Social situations can be difficult when you are quitting smoking. To manage this, you can: Avoid parties and other social situations where people might be smoking. Avoid alcohol. Leave right away if you have the urge to smoke. Explain to your family and friends that you are quitting smoking. Ask for support and let them know you might be a bit grumpy. Plan activities where smoking is not an option. General instructions Be aware that many people gain weight after they quit smoking. However, not everyone does. To keep from gaining weight, have a plan in place before you quit and stick to the plan after you quit. Your plan should include: Having healthy snacks. When you have a craving, it may help to: Eat popcorn, carrots, celery, or other cut vegetables. Chew sugar-free gum. Changing how you eat. Eat small  portion sizes at meals. Eat 4-6 small meals throughout the day instead of 1-2 large meals a day. Be mindful when you eat. Do not watch television or do other things that might distract you as you eat. Exercising regularly. Make time to exercise each day. If you do not have time for a long workout, do short bouts of exercise for 5-10 minutes several times a day. Do some form of strengthening exercise, such as weight lifting. Do some exercise that gets your heart beating and causes you to breathe deeply, such as walking fast, running, swimming, or biking. This is very important. Drinking plenty of water or other low-calorie or no-calorie drinks. Drink 6-8 glasses of water daily.  How to recognize withdrawal symptoms Your body and mind may experience discomfort as you try to get used to not having nicotine in your system. These effects are called withdrawal symptoms. They may include: Feeling hungrier than normal. Having trouble concentrating. Feeling irritable or restless. Having trouble sleeping. Feeling depressed. Craving a cigarette. To manage withdrawal symptoms: Avoid places, people, and activities that trigger your cravings. Remember why you want to quit. Get plenty of sleep. Avoid coffee and other caffeinated drinks. These may worsen some of your symptoms. These symptoms may surprise you. But be assured that they are normal to havewhen quitting smoking. How to manage cravings Come up with a plan for how to deal with your cravings. The plan should include the following: A definition of the specific situation you want to deal with. An alternative action you will take. A clear idea for how this action will help. The   name of someone who might help you with this. Cravings usually last for 5-10 minutes. Consider taking the following actions to help you with your plan to deal with cravings: Keep your mouth busy. Chew sugar-free gum. Suck on hard candies or a straw. Brush your  teeth. Keep your hands and body busy. Change to a different activity right away. Squeeze or play with a ball. Do an activity or a hobby, such as making bead jewelry, practicing needlepoint, or working with wood. Mix up your normal routine. Take a short exercise break. Go for a quick walk or run up and down stairs. Focus on doing something kind or helpful for someone else. Call a friend or family member to talk during a craving. Join a support group. Contact a quitline. Where to find support To get help or find a support group: Call the National Cancer Institute's Smoking Quitline: 1-800-QUIT NOW (784-8669) Visit the website of the Substance Abuse and Mental Health Services Administration: www.samhsa.gov Text QUIT to SmokefreeTXT: 478848 Where to find more information Visit these websites to find more information on quitting smoking: National Cancer Institute: www.smokefree.gov American Lung Association: www.lung.org American Cancer Society: www.cancer.org Centers for Disease Control and Prevention: www.cdc.gov American Heart Association: www.heart.org Contact a health care provider if: You want to change your plan for quitting. The medicines you are taking are not helping. Your eating feels out of control or you cannot sleep. Get help right away if: You feel depressed or become very anxious. Summary Quitting smoking is a physical and mental challenge. You will face cravings, withdrawal symptoms, and temptation to smoke again. Preparation can help you as you go through these challenges. Try different techniques to manage stress, handle social situations, and prevent weight gain. You can deal with cravings by keeping your mouth busy (such as by chewing gum), keeping your hands and body busy, calling family or friends, or contacting a quitline for people who want to quit smoking. You can deal with withdrawal symptoms by avoiding places where people smoke, getting plenty of rest, and  avoiding drinks with caffeine. This information is not intended to replace advice given to you by your health care provider. Make sure you discuss any questions you have with your healthcare provider. Document Revised: 06/06/2019 Document Reviewed: 06/06/2019 Elsevier Patient Education  2022 Elsevier Inc.  

## 2021-02-20 LAB — COMPLETE METABOLIC PANEL WITH GFR
AG Ratio: 1.7 (calc) (ref 1.0–2.5)
ALT: 80 U/L — ABNORMAL HIGH (ref 9–46)
AST: 57 U/L — ABNORMAL HIGH (ref 10–40)
Albumin: 4.4 g/dL (ref 3.6–5.1)
Alkaline phosphatase (APISO): 88 U/L (ref 36–130)
BUN: 9 mg/dL (ref 7–25)
CO2: 26 mmol/L (ref 20–32)
Calcium: 9.3 mg/dL (ref 8.6–10.3)
Chloride: 102 mmol/L (ref 98–110)
Creat: 0.84 mg/dL (ref 0.60–1.35)
GFR, Est African American: 133 mL/min/{1.73_m2} (ref >=60)
GFR, Est Non African American: 115 mL/min/{1.73_m2} (ref >=60)
Globulin: 2.6 g/dL (calc) (ref 1.9–3.7)
Glucose, Bld: 88 mg/dL (ref 65–99)
Potassium: 4.1 mmol/L (ref 3.5–5.3)
Sodium: 135 mmol/L (ref 135–146)
Total Bilirubin: 1.4 mg/dL — ABNORMAL HIGH (ref 0.2–1.2)
Total Protein: 7 g/dL (ref 6.1–8.1)

## 2021-02-20 NOTE — Progress Notes (Signed)
Luke Wilkerson,   Liver enzymes up. It could be from the alcohol you had last night. Please consider avoiding alcohol for 2 weeks and then recheck liver enzymes.

## 2021-02-21 ENCOUNTER — Other Ambulatory Visit: Payer: Self-pay | Admitting: Neurology

## 2021-02-21 DIAGNOSIS — R748 Abnormal levels of other serum enzymes: Secondary | ICD-10-CM

## 2021-02-24 ENCOUNTER — Encounter: Payer: Self-pay | Admitting: Physician Assistant

## 2021-03-05 ENCOUNTER — Other Ambulatory Visit: Payer: Self-pay

## 2021-03-05 ENCOUNTER — Ambulatory Visit (INDEPENDENT_AMBULATORY_CARE_PROVIDER_SITE_OTHER): Payer: BC Managed Care – PPO | Admitting: Physician Assistant

## 2021-03-05 VITALS — BP 149/94 | HR 67 | Ht 71.0 in | Wt 227.0 lb

## 2021-03-05 DIAGNOSIS — E538 Deficiency of other specified B group vitamins: Secondary | ICD-10-CM | POA: Diagnosis not present

## 2021-03-05 DIAGNOSIS — F909 Attention-deficit hyperactivity disorder, unspecified type: Secondary | ICD-10-CM

## 2021-03-05 DIAGNOSIS — F17200 Nicotine dependence, unspecified, uncomplicated: Secondary | ICD-10-CM

## 2021-03-05 DIAGNOSIS — R748 Abnormal levels of other serum enzymes: Secondary | ICD-10-CM | POA: Diagnosis not present

## 2021-03-05 DIAGNOSIS — I10 Essential (primary) hypertension: Secondary | ICD-10-CM | POA: Diagnosis not present

## 2021-03-05 DIAGNOSIS — F1010 Alcohol abuse, uncomplicated: Secondary | ICD-10-CM

## 2021-03-05 MED ORDER — AMPHETAMINE-DEXTROAMPHETAMINE 20 MG PO TABS
20.0000 mg | ORAL_TABLET | Freq: Two times a day (BID) | ORAL | 0 refills | Status: DC
Start: 2021-03-05 — End: 2021-04-02

## 2021-03-05 MED ORDER — LOSARTAN POTASSIUM 25 MG PO TABS: 25.0000 mg | ORAL_TABLET | Freq: Every day | ORAL | 0 refills | Status: DC

## 2021-03-05 NOTE — Progress Notes (Signed)
Subjective:    Patient ID: Luke Wilkerson, male    DOB: 06/29/1986, 35 y.o.   MRN: [id number]  HPI Pt is a 35 yo male with ADHD, HTN, elevated liver enzymes who presents to the clinic to follow up on HTN.   He admits he is still drinking and smoking and not made a lot of any life style changes to decrease BP. He is not checking BP at home. He admits to drinking every night about 10-12 liquor shots in a soda but he drinks water all day long. No CP, palpitations, headaches, or vision changes. He only smokes when he drinks but that is every night.   .. Active Ambulatory Problems    Diagnosis Date Noted   Back pain 02/08/2014   Alcohol consumption of more than two drinks per day 03/12/2017   Adult ADHD 03/12/2017   Elevated blood pressure reading 03/12/2017   Moderate episode of recurrent major depressive disorder (HCC) 03/12/2017   Social anxiety disorder 03/12/2017   Snoring 03/12/2017   At risk for obstructive sleep apnea 03/12/2017   Tobacco use 03/12/2017   Hypertension goal BP (blood pressure) < 130/80 04/09/2017   RUQ abdominal pain 05/10/2017   Elevated ALT measurement 05/11/2017   NAFL (nonalcoholic fatty liver) 05/25/2017   Perianal abscess 06/21/2017   Hypertriglyceridemia 04/10/2019   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 05/12/2019   Non-restorative sleep 10/20/2019   Frequent headaches 10/20/2019   Class 1 obesity due to excess calories with serious comorbidity and body mass index (BMI) of 32.0 to 32.9 in adult 10/20/2019   Fracture of right greater tuberosity and inferior glenoid 11/12/2020   Tobacco dependence 03/05/2021   Elevated liver enzymes 03/05/2021   Alcohol abuse 03/05/2021   Alcoholic fatty liver 03/06/2021   Resolved Ambulatory Problems    Diagnosis Date Noted   No Resolved Ambulatory Problems   Past Medical History:  Diagnosis Date   ADHD    Anxiety    Hypertension    Obesity    Pneumonia 2016      Review of Systems See HPI.      Objective:   Physical Exam Vitals reviewed.  Constitutional:      Appearance: Normal appearance. He is obese.  HENT:     Head: Normocephalic.  Neck:     Vascular: No carotid bruit.  Cardiovascular:     Rate and Rhythm: Normal rate and regular rhythm.     Pulses: Normal pulses.     Heart sounds: Normal heart sounds.  Pulmonary:     Effort: Pulmonary effort is normal.     Breath sounds: Normal breath sounds.  Musculoskeletal:     Right lower leg: No edema.     Left lower leg: No edema.  Neurological:     General: No focal deficit present.     Mental Status: He is alert and oriented to person, place, and time.  Psychiatric:        Mood and Affect: Mood normal.          Assessment & Plan:  Marland KitchenMarland KitchenKesley was seen today for hypertension.  Diagnoses and all orders for this visit:  Hypertension goal BP (blood pressure) < 130/80 -     losartan (COZAAR) 25 MG tablet; Take 1 tablet (25 mg total) by mouth daily.  Tobacco dependence  Adult ADHD -     amphetamine-dextroamphetamine (ADDERALL) 20 MG tablet; Take 1 tablet (20 mg total) by mouth 2 (two) times daily.  Alcohol abuse -  Hepatic function panel  Elevated liver enzymes -     Hepatic function panel   Discussed appears patient has a alcohol and tobacco abuse problem. Combining stimulant with alcohol can also be worsening BP issues. Discussed with patient to quit alcohol and see if all these things improve. If not willing to quit stop liquor and go back to beer and cut off at 4 beers a night. Recheck liver enzymes today. Follow up BP in one month to continue this conversation.   Spent 30 minutes with patient discussing effects of alcohol/smoking on overall health.

## 2021-03-05 NOTE — Patient Instructions (Addendum)
Start cozaar with norvasc. Recheck one month.   Alcohol Abuse and Nutrition Alcohol abuse is any pattern of alcohol consumption that harms your health, relationships, or work. Alcohol abuse can cause poor nutrition (malnutrition or malnourishment) and a lack of nutrients (nutrient deficiencies), which can lead to more health problems. Alcohol abuse brings malnutrition and nutrient deficiencies in two ways: It causes your liver to work abnormally. This affects how your body divides (breaks down) and absorbs nutrients from food. It causes you to eat poorly. Many people who abuse alcohol do not eat enough carbohydrates, protein, fat, vitamins, and minerals. Nutrients that are commonly lacking (deficient) in people who abuse alcohol include: Vitamins. Vitamin A. This is needed for your vision, metabolism, and ability to fight off infections (immunity). B vitamins. These include folate, thiamine, and niacin. These are needed for new cell growth. Vitamin C. This plays an important role in wound healing, immunity, and helping your body to absorb iron. Vitamin D. This is necessary for your body to absorb and use calcium. It is produced by your liver, but you can also get it from food and from sun exposure. Minerals. Calcium. This is needed for healthy bones as well as heart and blood vessel (cardiovascular) function. Iron. This is important for blood, muscle, and nervous system functioning. Magnesium. This plays an important role in muscle and nerve function, and it helps to control blood sugar and blood pressure. Zinc. This is important for the normal functioning of your nervous system and digestive system (gastrointestinal tract). If you think that you have an alcohol dependency problem, or if it is hard to stop drinking because you feel sick or different when you do not use alcohol, talk with your health care provider or another health professional about whereto get help. Nutrition is an essential  factor in the therapy for alcohol abuse. Your health care provider or diet and nutrition specialist (dietitian) will work with you to design a plan that can help to restore nutrients toyour body and prevent the risk of complications. What is my plan? Your dietitian may develop a specific eating plan that is based on your condition and any other problems that you have. An eating plan will commonly include: A balanced diet. Grains: 6-8 oz (170-227 g) a day. Examples of 1 oz of whole grains include 1 cup of whole-wheat cereal,  cup of brown rice, or 1 slice of whole-wheat bread. Vegetables: 2-3 cups a day. Examples of 1 cup of vegetables include 2 medium carrots, 1 large tomato, or 2 stalks of celery. Fruits: 1-2 cups a day. Examples of 1 cup of fruit include 1 large banana, 1 small apple, 8 large strawberries, or 1 large orange. Meat and other protein: 5-6 oz (142-170 g) a day. A cut of meat or fish that is the size of a deck of cards is about 3-4 oz. Foods that provide 1 oz of protein include 1 egg,  cup of nuts or seeds, or 1 tablespoon (16 g) of peanut butter. Dairy: 2-3 cups a day. Examples of 1 cup of dairy include 8 oz (230 mL) of milk, 8 oz (230 g) of yogurt, or 1 oz (44 g) of natural cheese. Vitamin and mineral supplements. What are tips for following this plan? Eat frequent meals and snacks. Try to eat 5-6 small meals each day. Take vitamin or mineral supplements as recommended by your dietitian. If you are malnourished or if your dietitian recommends it: You may follow a high-protein, high-calorie diet. This may  include: 2,000-3,000 calories (kilocalories) a day. 70-100 g (grams) of protein a day. You may be directed to follow a diet that includes a complete nutritional supplement beverage. This can help to restore calories, protein, and vitamins to your body. Depending on your condition, you may be advised to consume this beverage instead of your meals or in addition to them. Certain  medicines may cause changes in your appetite, taste, and weight. Work with your health care provider and dietitian to make any changes to your medicines and eating plan. If you are unable to take in enough food and calories by mouth, your health care provider may recommend a feeding tube. This tube delivers nutritional supplements directly to your stomach. Recommended foods Eat foods that are high in molecules that prevent oxygen from reacting with your food (antioxidants). These foods include grapes, berries, nuts, green tea, and dark green or orange vegetables. Eating these can help to prevent some of the stress that is placed on your liver by consuming alcohol. Eat a variety of fresh fruits and vegetables each day. This will help you to get fiber and vitamins in your diet. Drink plenty of water and other clear fluids, such as apple juice and broth. Try to drink at least 48-64 oz (1.5-2 L) of water a day. Include foods fortified with vitamins and minerals in your diet. Commonly fortified foods include milk, orange juice, cereal, and bread. Eat a variety of foods that are high in omega-3 and omega-6 fatty acids. These include fish, nuts and seeds, and soybeans. These foods may help your liver to recover and may also stabilize your mood. If you are a vegetarian: Eat a variety of protein-rich foods. Pair whole grains with plant-based proteins at meals and snack time. For example, eat rice with beans, put peanut butter on whole-grain toast, or eat oatmeal with sunflower seeds. The items listed above may not be a complete list of foods and beverages youcan eat. Contact a dietitian for more information. Foods to avoid Avoid foods and drinks that are high in fat and sugar. Sugary drinks, salty snacks, and candy contain empty calories. This means that they lack important nutrients such as protein, fiber, and vitamins. Avoid alcohol. This is the best way to avoid malnutrition due to alcohol abuse. If you must  drink, drink measured amounts. Measured drinking means limiting your intake to no more than 1 drink a day for nonpregnant women and 2 drinks a day for men. One drink equals 12 oz (355 mL) of beer, 5 oz (148 mL) of wine, or 1 oz (44 mL) of hard liquor. Limit your intake of caffeine. Replace drinks like coffee and black tea with decaffeinated coffee and decaffeinated herbal tea. The items listed above may not be a complete list of foods and beverages youshould avoid. Contact a dietitian for more information. Summary Alcohol abuse can cause poor nutrition (malnutrition or malnourishment) and a lack of nutrients (nutrient deficiencies), which can lead to more health problems. Common nutrient deficiencies include vitamin deficiencies (A, B, C, and D) and mineral deficiencies (calcium, iron, magnesium, and zinc). Nutrition is an essential factor in the therapy for alcohol abuse. Your health care provider and dietitian can help you to develop a specific eating plan that includes a balanced diet plus vitamin and mineral supplements. This information is not intended to replace advice given to you by your health care provider. Make sure you discuss any questions you have with your healthcare provider. Document Revised: 07/08/2020 Document Reviewed: 07/08/2020 Elsevier  Elsevier Patient Education  2022 Else[employer] Inc.  

## 2021-03-06 ENCOUNTER — Encounter: Payer: Self-pay | Admitting: Physician Assistant

## 2021-03-06 ENCOUNTER — Other Ambulatory Visit: Payer: Self-pay | Admitting: Neurology

## 2021-03-06 DIAGNOSIS — R748 Abnormal levels of other serum enzymes: Secondary | ICD-10-CM

## 2021-03-06 DIAGNOSIS — F1010 Alcohol abuse, uncomplicated: Secondary | ICD-10-CM

## 2021-03-06 DIAGNOSIS — K700 Alcoholic fatty liver: Secondary | ICD-10-CM | POA: Insufficient documentation

## 2021-03-06 NOTE — Progress Notes (Signed)
Liver enzymes a little better than last check but still elevated. (JJ can we add hepatitis panel, ferritin, serum iron, B1, B12, vitamin D, folate to these labs)  I would also like to get liver elastrography done with him. He has had ultrasound but this is a screening for cirrhosis.

## 2021-03-07 ENCOUNTER — Encounter: Payer: Self-pay | Admitting: Physician Assistant

## 2021-03-10 ENCOUNTER — Encounter: Payer: Self-pay | Admitting: Physician Assistant

## 2021-03-10 ENCOUNTER — Other Ambulatory Visit: Payer: Self-pay | Admitting: Physician Assistant

## 2021-03-10 DIAGNOSIS — R790 Abnormal level of blood mineral: Secondary | ICD-10-CM | POA: Insufficient documentation

## 2021-03-10 DIAGNOSIS — R7989 Other specified abnormal findings of blood chemistry: Secondary | ICD-10-CM | POA: Insufficient documentation

## 2021-03-10 DIAGNOSIS — R748 Abnormal levels of other serum enzymes: Secondary | ICD-10-CM

## 2021-03-10 DIAGNOSIS — E538 Deficiency of other specified B group vitamins: Secondary | ICD-10-CM | POA: Insufficient documentation

## 2021-03-10 MED ORDER — FOLIC ACID 1 MG PO TABS: 1.0000 mg | ORAL_TABLET | Freq: Every day | ORAL | 3 refills | Status: DC

## 2021-03-10 NOTE — Progress Notes (Signed)
Your iron and iron store level is really elevated. I am sending you to hematology for more work up. Are you taking any extra iron or multivitamin?  Your folate is low. Sent folate to start taking daily.

## 2021-03-11 ENCOUNTER — Telehealth: Payer: Self-pay

## 2021-03-11 LAB — HEPATIC FUNCTION PANEL
AG Ratio: 1.8 (calc) (ref 1.0–2.5)
ALT: 64 U/L — ABNORMAL HIGH (ref 9–46)
AST: 45 U/L — ABNORMAL HIGH (ref 10–40)
Albumin: 4.6 g/dL (ref 3.6–5.1)
Alkaline phosphatase (APISO): 88 U/L (ref 36–130)
Bilirubin, Direct: 0.2 mg/dL (ref 0.0–0.2)
Globulin: 2.6 g/dL (calc) (ref 1.9–3.7)
Indirect Bilirubin: 1.1 mg/dL (calc) (ref 0.2–1.2)
Total Bilirubin: 1.3 mg/dL — ABNORMAL HIGH (ref 0.2–1.2)
Total Protein: 7.2 g/dL (ref 6.1–8.1)

## 2021-03-11 LAB — ACUTE HEP PANEL AND HEP B SURFACE AB
HEPATITIS C ANTIBODY REFILL$(REFL): NONREACTIVE
Hep A IgM: NONREACTIVE
Hep B C IgM: NONREACTIVE
Hepatitis B Surface Ag: NONREACTIVE
SIGNAL TO CUT-OFF: 0.2 (ref <1.00)

## 2021-03-11 LAB — REFLEX TIQ

## 2021-03-11 LAB — TEST AUTHORIZATION

## 2021-03-11 LAB — VITAMIN B12: Vitamin B-12: 442 pg/mL (ref 200–1100)

## 2021-03-11 LAB — IRON,TIBC AND FERRITIN PANEL
%SAT: 89 % (calc) — ABNORMAL HIGH (ref 20–48)
Ferritin: 766 ng/mL — ABNORMAL HIGH (ref 38–380)
Iron: 324 ug/dL — ABNORMAL HIGH (ref 50–180)
TIBC: 366 mcg/dL (calc) (ref 250–425)

## 2021-03-11 LAB — VITAMIN D 25 HYDROXY (VIT D DEFICIENCY, FRACTURES): Vit D, 25-Hydroxy: 32 ng/mL (ref 30–100)

## 2021-03-11 LAB — FOLATE: Folate: 2.9 ng/mL — ABNORMAL LOW

## 2021-03-24 ENCOUNTER — Ambulatory Visit
Admission: RE | Admit: 2021-03-24 | Discharge: 2021-03-24 | Disposition: A | Discharge status: Home / Self Care | Payer: BC Managed Care – PPO | Source: Ambulatory Visit | Attending: Physician Assistant | Admitting: Physician Assistant

## 2021-03-24 DIAGNOSIS — R945 Abnormal results of liver function studies: Secondary | ICD-10-CM | POA: Diagnosis not present

## 2021-03-24 DIAGNOSIS — R748 Abnormal levels of other serum enzymes: Secondary | ICD-10-CM

## 2021-03-24 DIAGNOSIS — F1010 Alcohol abuse, uncomplicated: Secondary | ICD-10-CM

## 2021-03-25 NOTE — Progress Notes (Signed)
Fatty liver but overall no advanced signs of liver disease.

## 2021-03-26 ENCOUNTER — Other Ambulatory Visit: Payer: Self-pay | Admitting: Family

## 2021-03-27 ENCOUNTER — Other Ambulatory Visit: Payer: Self-pay

## 2021-03-27 ENCOUNTER — Encounter: Payer: Self-pay | Admitting: Family

## 2021-03-27 ENCOUNTER — Inpatient Hospital Stay: Payer: BC Managed Care – PPO | Attending: Hematology & Oncology

## 2021-03-27 ENCOUNTER — Inpatient Hospital Stay (HOSPITAL_BASED_OUTPATIENT_CLINIC_OR_DEPARTMENT_OTHER): Payer: BC Managed Care – PPO | Admitting: Family

## 2021-03-27 DIAGNOSIS — Z79899 Other long term (current) drug therapy: Secondary | ICD-10-CM | POA: Diagnosis not present

## 2021-03-27 DIAGNOSIS — Z8249 Family history of ischemic heart disease and other diseases of the circulatory system: Secondary | ICD-10-CM | POA: Diagnosis not present

## 2021-03-27 DIAGNOSIS — F17210 Nicotine dependence, cigarettes, uncomplicated: Secondary | ICD-10-CM

## 2021-03-27 DIAGNOSIS — F909 Attention-deficit hyperactivity disorder, unspecified type: Secondary | ICD-10-CM | POA: Insufficient documentation

## 2021-03-27 DIAGNOSIS — F1010 Alcohol abuse, uncomplicated: Secondary | ICD-10-CM | POA: Diagnosis not present

## 2021-03-27 LAB — RETICULOCYTES
Immature Retic Fract: 17.7 % — ABNORMAL HIGH (ref 2.3–15.9)
RBC.: 4.47 MIL/uL (ref 4.22–5.81)
Retic Count, Absolute: 112.2 10*3/uL (ref 19.0–186.0)
Retic Ct Pct: 2.5 % (ref 0.4–3.1)

## 2021-03-27 LAB — CMP (CANCER CENTER ONLY)
ALT: 90 U/L — ABNORMAL HIGH (ref 0–44)
AST: 55 U/L — ABNORMAL HIGH (ref 15–41)
Albumin: 4.6 g/dL (ref 3.5–5.0)
Alkaline Phosphatase: 94 U/L (ref 38–126)
Anion gap: 10 (ref 5–15)
BUN: 12 mg/dL (ref 6–20)
CO2: 26 mmol/L (ref 22–32)
Calcium: 10.6 mg/dL — ABNORMAL HIGH (ref 8.9–10.3)
Chloride: 104 mmol/L (ref 98–111)
Creatinine: 1 mg/dL (ref 0.61–1.24)
GFR, Estimated: >60 mL/min (ref >60)
Glucose, Bld: 95 mg/dL (ref 70–99)
Potassium: 3.9 mmol/L (ref 3.5–5.1)
Sodium: 140 mmol/L (ref 135–145)
Total Bilirubin: 1.2 mg/dL (ref 0.3–1.2)
Total Protein: 7.5 g/dL (ref 6.5–8.1)

## 2021-03-27 LAB — CBC WITH DIFFERENTIAL (CANCER CENTER ONLY)
Abs Immature Granulocytes: 0.12 10*3/uL — ABNORMAL HIGH (ref 0.00–0.07)
Basophils Absolute: 0 10*3/uL (ref 0.0–0.1)
Basophils Relative: 0 %
Eosinophils Absolute: 0.3 10*3/uL (ref 0.0–0.5)
Eosinophils Relative: 3 %
HCT: 45.5 % (ref 39.0–52.0)
Hemoglobin: 16.5 g/dL (ref 13.0–17.0)
Immature Granulocytes: 1 %
Lymphocytes Relative: 25 %
Lymphs Abs: 2.4 10*3/uL (ref 0.7–4.0)
MCH: 37 pg — ABNORMAL HIGH (ref 26.0–34.0)
MCHC: 36.3 g/dL — ABNORMAL HIGH (ref 30.0–36.0)
MCV: 102 fL — ABNORMAL HIGH (ref 80.0–100.0)
Monocytes Absolute: 0.8 10*3/uL (ref 0.1–1.0)
Monocytes Relative: 9 %
Neutro Abs: 5.8 10*3/uL (ref 1.7–7.7)
Neutrophils Relative %: 62 %
Platelet Count: 237 10*3/uL (ref 150–400)
RBC: 4.46 MIL/uL (ref 4.22–5.81)
RDW: 11.8 % (ref 11.5–15.5)
WBC Count: 9.4 10*3/uL (ref 4.0–10.5)
nRBC: 0 % (ref 0.0–0.2)

## 2021-03-27 LAB — SAVE SMEAR(SSMR), FOR PROVIDER SLIDE REVIEW

## 2021-03-27 LAB — LACTATE DEHYDROGENASE: LDH: 199 U/L — ABNORMAL HIGH (ref 98–192)

## 2021-03-27 NOTE — Progress Notes (Signed)
Hematology/Oncology Consultation   Name: Luke Wilkerson      MRN: [id number]    Location: Room/bed info not found  Date: 03/27/2021 Time:3:28 PM   REFERRING PHYSICIAN: Tandy Gaw, PA-C  REASON FOR CONSULT: Elevated LFTs, raised serum iron and elevated ferritin    DIAGNOSIS: Elevated serum iron and ferritin  HISTORY OF PRESENT ILLNESS: Luke Wilkerson is a very pleasant 35 yo caucasian gentleman with recent elevated iron studies (iron saturation 89% and ferritin 766).  These were checked after he was noted to have elevated LFT's at a routine visit with his PCP.  Liver US on 03/24/2021 showed hepatic steatosis.  He has occasional headaches and blurry vision as well as dizziness.  He has intermittent numbness and tingling in his hands and feet. He also notes occasional pin prick sensations in the bottoms of his feet.  He states that he drinks multiple liquor drinks in the evenings. He will typically mix this with ice and pepsi.  His father has history of ETOH abuse. His paternal second cousin passed away from complications of liver disease due to ETOH.  Strong history of heart attack stroke on his father's side of the family.  He does not sleep well and states that he stops breathing while he sleeps. He has not had a sleep study done to diagnose with sleep apnea but we do recommend he move forward with this testing.  Of note, he states that he often drinks to help fall asleep. He notes episodes of sweating at times when he does not.  He does smoke but states that the amount he smokes varies from 1 cigarette a day to 1 1/2 ppd. He does smoke cannabis and the occasional mushrooms from time to time.  He did not pick up his Wellbutrin prescription but states that he is interested in giving this a try.   No history of diabetes or thyroid disease.  No fever, chills, n/v, cough, rash, chest pain, palpitations, abdominal pain or changes in bowel or bladder habits.  He has cramps in his sides when he turns side  to side at times.  No swelling or tenderness in his extremities at this time.  No falls or syncope to report.  He takes Adderall for ADHD.  He states that he does not have an appetite during the day but will often eat heavily in the evenings. He feels that he does hydrate well throughout the day. Weight is stable at 224 lbs.  He works from home in [employer] for [employer].  He stays quite active walking his dog, mowing the lawn, hunting and fishing.   ROS: All other 10 point review of systems is negative.   PAST MEDICAL HISTORY:   Past Medical History:  Diagnosis Date   ADHD    Anxiety    Elevated ALT measurement 05/11/2017   AST:ALT 0.5   Hypertension    Hypertriglyceridemia 04/10/2019   Obesity    Pneumonia 2016    ALLERGIES: No Known Allergies    MEDICATIONS:  Current Outpatient Medications on File Prior to Visit  Medication Sig Dispense Refill   amLODipine (NORVASC) 10 MG tablet Take 1 tablet (10 mg total) by mouth daily. 90 tablet 1   amphetamine-dextroamphetamine (ADDERALL) 20 MG tablet Take 1 tablet (20 mg total) by mouth 2 (two) times daily. 60 tablet 0   folic acid (FOLVITE) 1 MG tablet Take 1 tablet (1 mg total) by mouth daily. 90 tablet 3   losartan (COZAAR) 25 MG tablet Take 1  tablet (25 mg total) by mouth daily. 90 tablet 0   No current facility-administered medications on file prior to visit.     PAST SURGICAL HISTORY Past Surgical History:  Procedure Laterality Date   EVALUATION UNDER ANESTHESIA WITH HEMORRHOIDECTOMY N/A 05/12/2019   Procedure: ANORECTAL EXAM UNDER ANESTHESIA WITH LIFT REPAIR;  Surgeon: Karie Soda, MD;  Location: [facility];  Service: General;  Laterality: N/A;   FRACTURE SURGERY Right 2008   Rt knee   FRACTURE SURGERY Right 2016   Rt wrist from old injury   LEG SURGERY     WRIST ARTHROSCOPY      FAMILY HISTORY: Family History  Problem Relation Age of Onset   Hypertension Mother    Hyperlipidemia Mother    Diabetes Father      SOCIAL HISTORY:  reports that he has been smoking cigarettes. He has a 15.00 pack-year smoking history. He has never used smokeless tobacco. He reports current alcohol use of about 7.0 standard drinks of alcohol per week. He reports that he does not use drugs.  PERFORMANCE STATUS: The patient's performance status is 1 - Symptomatic but completely ambulatory  PHYSICAL EXAM: Most Recent Vital Signs: There were no vitals taken for this visit. BP (!) 136/92 (BP Location: Left Arm, Patient Position: Sitting)   Pulse 81   Temp 98.7 F (37.1 C) (Oral)   Resp 18   Ht 5\' 11"  (1.803 m)   Wt 224 lb 12.8 oz (102 kg)   SpO2 99%   BMI 31.35 kg/m   General Appearance:    Alert, cooperative, no distress, appears stated age  Head:    Normocephalic, without obvious abnormality, atraumatic  Eyes:    PERRL, conjunctiva/corneas clear, EOM's intact, fundi    benign, both eyes             Throat:   Lips, mucosa, and tongue normal; teeth and gums normal  Neck:   Supple, symmetrical, trachea midline, no adenopathy;       thyroid:  No enlargement/tenderness/nodules; no carotid   bruit or JVD  Back:     Symmetric, no curvature, ROM normal, no CVA tenderness  Lungs:     Clear to auscultation bilaterally, respirations unlabored  Chest wall:    No tenderness or deformity  Heart:    Regular rate and rhythm, S1 and S2 normal, no murmur, rub   or gallop  Abdomen:     Soft, non-tender, bowel sounds active all four quadrants,    no masses, no organomegaly        Extremities:   Extremities normal, atraumatic, no cyanosis or edema  Pulses:   2+ and symmetric all extremities  Skin:   Skin color, texture, turgor normal, no rashes or lesions  Lymph nodes:   Cervical, supraclavicular, and axillary nodes normal  Neurologic:   CNII-XII intact. Normal strength, sensation and reflexes      throughout    LABORATORY DATA:  Results for orders placed or performed in visit on 03/27/21 (from the past 48 hour(s))   Reticulocytes     Status: Abnormal   Collection Time: 03/27/21  3:13 PM  Result Value Ref Range   Retic Ct Pct 2.5 0.4 - 3.1 %   RBC. 4.47 4.22 - 5.81 MIL/uL   Retic Count, Absolute 112.2 19.0 - 186.0 K/uL   Immature Retic Fract 17.7 (H) 2.3 - 15.9 %    Comment: Performed at [facility] Lab at [facility], [address], [place],  [place] [address]  Save Smear (SSMR)     Status: None   Collection Time: 03/27/21  3:13 PM  Result Value Ref Range   Smear Review SMEAR STAINED AND AVAILABLE FOR REVIEW     Comment: Performed at [facility] Lab at [facility], [address] [place], [place] [address]  CBC with Differential (Cancer Center Only)     Status: Abnormal   Collection Time: 03/27/21  3:13 PM  Result Value Ref Range   WBC Count 9.4 4.0 - 10.5 K/uL   RBC 4.46 4.22 - 5.81 MIL/uL   Hemoglobin 16.5 13.0 - 17.0 g/dL   HCT 93.7 34.2 - 87.6 %   MCV 102.0 (H) 80.0 - 100.0 fL   MCH 37.0 (H) 26.0 - 34.0 pg   MCHC 36.3 (H) 30.0 - 36.0 g/dL   RDW 81.1 57.2 - 62.0 %   Platelet Count 237 150 - 400 K/uL   nRBC 0.0 0.0 - 0.2 %   Neutrophils Relative % 62 %   Neutro Abs 5.8 1.7 - 7.7 K/uL   Lymphocytes Relative 25 %   Lymphs Abs 2.4 0.7 - 4.0 K/uL   Monocytes Relative 9 %   Monocytes Absolute 0.8 0.1 - 1.0 K/uL   Eosinophils Relative 3 %   Eosinophils Absolute 0.3 0.0 - 0.5 K/uL   Basophils Relative 0 %   Basophils Absolute 0.0 0.0 - 0.1 K/uL   Immature Granulocytes 1 %   Abs Immature Granulocytes 0.12 (H) 0.00 - 0.07 K/uL    Comment: Performed at [facility] Lab at [facility], [address] [place], [place] [address]      RADIOGRAPHY: No results found.     PATHOLOGY: None  ASSESSMENT/PLAN: Mr. Champine is a very pleasant 35 yo caucasian gentleman with recent elevated iron studies concerning with hemochromatosis vs iron overload secondary to ETOH abuse.  Iron studies and Hemochromatosis DNA testing pending.   We will see what his counts look like and set him up on a phlebotomy program if needed.  We discussed his alcohol intake and how it can effect his iron counts and he is going to start cutting back.  He plans to follow-up with his PCP regarding starting Wellbutrin.  We also recommend he move forward with sleep study to assess for sleep apnea.  Follow-up in 4 months.   All questions were answered. The patient knows to call the clinic with any problems, questions or concerns. We can certainly see the patient much sooner if necessary.  The patient was discussed with Dr. Myna Hidalgo and he is in agreement with the aforementioned.   Emeline Gins, NP

## 2021-03-28 LAB — IRON AND TIBC
Iron: 181 ug/dL — ABNORMAL HIGH (ref 42–163)
Saturation Ratios: 53 % (ref 20–55)
TIBC: 343 ug/dL (ref 202–409)
UIBC: 162 ug/dL (ref 117–376)

## 2021-03-28 LAB — FERRITIN: Ferritin: 713 ng/mL — ABNORMAL HIGH (ref 24–336)

## 2021-03-31 ENCOUNTER — Telehealth: Payer: Self-pay | Admitting: *Deleted

## 2021-03-31 NOTE — Telephone Encounter (Signed)
No 03/27/21 los

## 2021-04-02 ENCOUNTER — Encounter: Payer: Self-pay | Admitting: Physician Assistant

## 2021-04-02 ENCOUNTER — Ambulatory Visit (INDEPENDENT_AMBULATORY_CARE_PROVIDER_SITE_OTHER): Payer: BC Managed Care – PPO | Admitting: Physician Assistant

## 2021-04-02 VITALS — BP 140/90 | HR 80 | Temp 98.6°F | Ht 71.0 in | Wt 234.0 lb

## 2021-04-02 DIAGNOSIS — Z1159 Encounter for screening for other viral diseases: Secondary | ICD-10-CM | POA: Diagnosis not present

## 2021-04-02 DIAGNOSIS — Z114 Encounter for screening for human immunodeficiency virus [HIV]: Secondary | ICD-10-CM

## 2021-04-02 DIAGNOSIS — F1010 Alcohol abuse, uncomplicated: Secondary | ICD-10-CM

## 2021-04-02 DIAGNOSIS — I10 Essential (primary) hypertension: Secondary | ICD-10-CM | POA: Diagnosis not present

## 2021-04-02 DIAGNOSIS — K700 Alcoholic fatty liver: Secondary | ICD-10-CM

## 2021-04-02 DIAGNOSIS — E559 Vitamin D deficiency, unspecified: Secondary | ICD-10-CM

## 2021-04-02 DIAGNOSIS — F909 Attention-deficit hyperactivity disorder, unspecified type: Secondary | ICD-10-CM | POA: Diagnosis not present

## 2021-04-02 DIAGNOSIS — E538 Deficiency of other specified B group vitamins: Secondary | ICD-10-CM

## 2021-04-02 MED ORDER — AMPHETAMINE-DEXTROAMPHETAMINE 20 MG PO TABS: 20.0000 mg | ORAL_TABLET | Freq: Two times a day (BID) | ORAL | 0 refills | Status: DC

## 2021-04-02 MED ORDER — TELMISARTAN-HCTZ 40-12.5 MG PO TABS: 1.0000 | ORAL_TABLET | Freq: Every day | ORAL | 1 refills | Status: DC

## 2021-04-02 MED ORDER — VITAMIN D (ERGOCALCIFEROL) 1.25 MG (50000 UNIT) PO CAPS: 50000.0000 [IU] | ORAL_CAPSULE | ORAL | 3 refills | Status: DC

## 2021-04-02 NOTE — Progress Notes (Signed)
Subjective:    Patient ID: Luke Wilkerson, male    DOB: 09/02/1985, 35 y.o.   MRN: [id number]  HPI Pt is a 35 yo male with HTN and ADHD who presents to the clinic for medication management.   Pt is not checking BP at home. He is taking both norvasc and cozaar. He does not like the way cozaar makes him feel. He thinks "it is making his BP worse". No CP, palpitations, headaches, or vision changes.   Continues to drink alcohol every night. Drinking more beers now than liquor. Does not want to quit.   Seeing hematology for possible hemochromatosis.   Marland Kitchen. Active Ambulatory Problems    Diagnosis Date Noted   Back pain 02/08/2014   Alcohol consumption of more than two drinks per day 03/12/2017   Adult ADHD 03/12/2017   Elevated blood pressure reading 03/12/2017   Moderate episode of recurrent major depressive disorder (HCC) 03/12/2017   Social anxiety disorder 03/12/2017   Snoring 03/12/2017   At risk for obstructive sleep apnea 03/12/2017   Tobacco use 03/12/2017   Hypertension goal BP (blood pressure) < 130/80 04/09/2017   RUQ abdominal pain 05/10/2017   Elevated ALT measurement 05/11/2017   NAFL (nonalcoholic fatty liver) 05/25/2017   Perianal abscess 06/21/2017   Hypertriglyceridemia 04/10/2019   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 05/12/2019   Non-restorative sleep 10/20/2019   Frequent headaches 10/20/2019   Class 1 obesity due to excess calories with serious comorbidity and body mass index (BMI) of 32.0 to 32.9 in adult 10/20/2019   Fracture of right greater tuberosity and inferior glenoid 11/12/2020   Tobacco dependence 03/05/2021   Elevated liver enzymes 03/05/2021   Alcohol abuse 03/05/2021   Alcoholic fatty liver 03/06/2021   Elevated ferritin 03/10/2021   Raised serum iron 03/10/2021   Folate deficiency 03/10/2021   Resolved Ambulatory Problems    Diagnosis Date Noted   No Resolved Ambulatory Problems   Past Medical History:  Diagnosis Date   ADHD     Anxiety    Hypertension    Obesity    Pneumonia 2016      Review of Systems    See HPI.  Objective:   Physical Exam Vitals reviewed.  Constitutional:      Appearance: Normal appearance. He is obese.  HENT:     Head: Normocephalic.  Cardiovascular:     Rate and Rhythm: Normal rate and regular rhythm.     Pulses: Normal pulses.     Heart sounds: Normal heart sounds.  Pulmonary:     Effort: Pulmonary effort is normal.     Breath sounds: Normal breath sounds.  Neurological:     General: No focal deficit present.     Mental Status: He is alert and oriented to person, place, and time.  Psychiatric:        Mood and Affect: Mood normal.          Assessment & Plan:  Marland KitchenMarland KitchenCraven was seen today for hypertension.  Diagnoses and all orders for this visit:  Hypertension goal BP (blood pressure) < 130/80 -     telmisartan-hydrochlorothiazide (MICARDIS HCT) 40-12.5 MG tablet; Take 1 tablet by mouth daily.  Screening for HIV (human immunodeficiency virus)  Need for hepatitis C screening test  Adult ADHD -     amphetamine-dextroamphetamine (ADDERALL) 20 MG tablet; Take 1 tablet (20 mg total) by mouth 2 (two) times daily. -     amphetamine-dextroamphetamine (ADDERALL) 20 MG tablet; Take 1 tablet (20 mg total)  by mouth 2 (two) times daily.  Vitamin D deficiency -     Vitamin D, Ergocalciferol, (DRISDOL) 1.25 MG (50000 UNIT) CAPS capsule; Take 1 capsule (50,000 Units total) by mouth every 7 (seven) days.   Stop cozaar. Continue norvasc. Start micardis/HCT. Follow up in 2 months.   Continue adderall for now but still working to get BP to goal.   Discussed alcohol intake. Pt declines any treatment.   Hematology managing hemochromatosis.

## 2021-04-03 LAB — HEMOCHROMATOSIS DNA-PCR(C282Y,H63D)

## 2021-04-04 ENCOUNTER — Telehealth: Payer: Self-pay | Admitting: Family

## 2021-04-04 ENCOUNTER — Encounter: Payer: Self-pay | Admitting: Physician Assistant

## 2021-04-04 ENCOUNTER — Telehealth: Payer: Self-pay | Admitting: *Deleted

## 2021-04-04 MED ORDER — AMPHETAMINE-DEXTROAMPHETAMINE 20 MG PO TABS: 20.0000 mg | ORAL_TABLET | Freq: Two times a day (BID) | ORAL | 0 refills | Status: DC

## 2021-04-04 NOTE — Telephone Encounter (Signed)
Left massage with call back number to go over lab results and follow-up in 4 months.

## 2021-04-04 NOTE — Telephone Encounter (Signed)
Per 03/27/21 los called and gave upcoming appointments - requested call back to confirm - mailed calendar

## 2021-05-07 ENCOUNTER — Encounter: Payer: Self-pay | Admitting: Medical-Surgical

## 2021-05-07 ENCOUNTER — Other Ambulatory Visit: Payer: Self-pay

## 2021-05-07 ENCOUNTER — Ambulatory Visit (INDEPENDENT_AMBULATORY_CARE_PROVIDER_SITE_OTHER): Payer: BC Managed Care – PPO | Admitting: Medical-Surgical

## 2021-05-07 VITALS — BP 148/102 | HR 100 | Resp 20 | Ht 71.0 in | Wt 232.0 lb

## 2021-05-07 DIAGNOSIS — L02214 Cutaneous abscess of groin: Secondary | ICD-10-CM | POA: Diagnosis not present

## 2021-05-07 DIAGNOSIS — S40862A Insect bite (nonvenomous) of left upper arm, initial encounter: Secondary | ICD-10-CM

## 2021-05-07 DIAGNOSIS — W57XXXA Bitten or stung by nonvenomous insect and other nonvenomous arthropods, initial encounter: Secondary | ICD-10-CM

## 2021-05-07 DIAGNOSIS — L03114 Cellulitis of left upper limb: Secondary | ICD-10-CM

## 2021-05-07 MED ORDER — SULFAMETHOXAZOLE-TRIMETHOPRIM 800-160 MG PO TABS
1.0000 | ORAL_TABLET | Freq: Two times a day (BID) | ORAL | 0 refills | Status: DC
Start: 2021-05-07 — End: 2021-06-02

## 2021-05-07 NOTE — Progress Notes (Signed)
  HPI with pertinent ROS:   CC: Bug bite on arm  HPI: 35 year old male presenting today with reports of a bug bite on his left upper arm on the inner aspect approximately 1 month ago.  He is unsure what bug bit him but noticed the area got red and swollen.  Approximately 2 weeks ago, he noted that squeezing it was very painful so he stuck a needle in it but did not get much drainage out.  Notes an improvement since yesterday after soaking it in warm water yesterday evening.  Also notes a history of having groin abscesses and reports having 1 in his groin for the last month or so.  This is a recurrent issue and has been going on for the last 5 years.  He is extremely frustrated today as he feels like he should be able to have a medication on hand to take care of this rather than coming into the doctor's appointments for evaluation.  Has been taking random antibiotics that he gets from friends and endorses taking dog amoxicillin most recently.  He noted improvement in the groin abscesses with amoxicillin but it did not resolve the area on his arm.  He ran out of the antibiotics and presents here today.  No fevers, chills, or myalgias.  I reviewed the past medical history, family history, social history, surgical history, and allergies today and no changes were needed.  Please see the problem list section below in epic for further details.   Physical exam:   General: Well Developed, well nourished, and in no acute distress.  Neuro: Alert and oriented x3.  HEENT: Normocephalic, atraumatic.  Skin: Warm and dry.  Approximately 3 cm round area of erythema to the left upper arm on the inner aspect with 1.5 x 1 cm swelling, nonfluctuant, no drainage, mildly tender.  Groin abscesses not visualized secondary to patient frustrated demeanor. Cardiac: Regular rate and rhythm, no murmurs rubs or gallops, no lower extremity edema.  Respiratory: Clear to auscultation bilaterally. Not using accessory muscles,  speaking in full sentences.  Impression and Recommendations:    1. Insect bite of left upper arm, initial encounter 2. Cellulitis of left upper extremity Discussed the nature of cellulitis with patient and recommended treatment with doxycycline to cover for MRSA.  He notes that amoxicillin is the only thing that ever helps.  After discussion, agreed to do a full course of amoxicillin to see if that would resolve the issue.  Advised patient that we cannot prescribe antibiotics with refills since we do require evaluation to ensure safe and appropriate treatment.  Patient got increasingly frustrated and does not understand why he was made to come in for an appointment to spend the money if all he needed to take was amoxicillin.  After evaluation and discussion, we are treating with Bactrim twice daily for 10 days.  Strongly recommend he see his PCP to discuss recurrent groin abscesses.  With his history of heavy sweating with activities, consider possible hydradenitis suppurativa.  Return for discussion of recurrent groin abscesses with PCP. ___________________________________________ Thayer Ohm, DNP, APRN, FNP-BC Primary Care and Sports Medicine [facility]

## 2021-05-23 ENCOUNTER — Other Ambulatory Visit: Payer: Self-pay | Admitting: Medical-Surgical

## 2021-06-02 ENCOUNTER — Ambulatory Visit (INDEPENDENT_AMBULATORY_CARE_PROVIDER_SITE_OTHER): Payer: BC Managed Care – PPO | Admitting: Physician Assistant

## 2021-06-02 ENCOUNTER — Encounter: Payer: Self-pay | Admitting: Physician Assistant

## 2021-06-02 ENCOUNTER — Other Ambulatory Visit: Payer: Self-pay

## 2021-06-02 VITALS — HR 76 | Temp 98.0°F | Ht 71.0 in | Wt 228.0 lb

## 2021-06-02 DIAGNOSIS — G4700 Insomnia, unspecified: Secondary | ICD-10-CM | POA: Diagnosis not present

## 2021-06-02 DIAGNOSIS — I10 Essential (primary) hypertension: Secondary | ICD-10-CM | POA: Diagnosis not present

## 2021-06-02 DIAGNOSIS — W57XXXD Bitten or stung by nonvenomous insect and other nonvenomous arthropods, subsequent encounter: Secondary | ICD-10-CM

## 2021-06-02 DIAGNOSIS — F909 Attention-deficit hyperactivity disorder, unspecified type: Secondary | ICD-10-CM | POA: Diagnosis not present

## 2021-06-02 DIAGNOSIS — F4010 Social phobia, unspecified: Secondary | ICD-10-CM

## 2021-06-02 DIAGNOSIS — S40862D Insect bite (nonvenomous) of left upper arm, subsequent encounter: Secondary | ICD-10-CM

## 2021-06-02 MED ORDER — AMPHETAMINE-DEXTROAMPHETAMINE 20 MG PO TABS: 20.0000 mg | ORAL_TABLET | Freq: Two times a day (BID) | ORAL | 0 refills | Status: DC

## 2021-06-02 MED ORDER — TRAZODONE HCL 50 MG PO TABS: 25.0000 mg | ORAL_TABLET | Freq: Every evening | ORAL | 3 refills | Status: DC | PRN

## 2021-06-02 MED ORDER — FLUOXETINE HCL 20 MG PO CAPS: 20.0000 mg | ORAL_CAPSULE | Freq: Every day | ORAL | 2 refills | Status: DC

## 2021-06-02 MED ORDER — AMLODIPINE BESYLATE 10 MG PO TABS: 10.0000 mg | ORAL_TABLET | Freq: Every day | ORAL | 1 refills | Status: DC

## 2021-06-02 MED ORDER — CLONAZEPAM 0.5 MG PO TABS
0.5000 mg | ORAL_TABLET | Freq: Two times a day (BID) | ORAL | 0 refills | Status: DC | PRN
Start: 2021-06-02 — End: 2021-09-09

## 2021-06-02 MED ORDER — SULFAMETHOXAZOLE-TRIMETHOPRIM 800-160 MG PO TABS: 1.0000 | ORAL_TABLET | Freq: Two times a day (BID) | ORAL | 0 refills | Status: DC

## 2021-06-02 MED ORDER — TELMISARTAN-HCTZ 40-12.5 MG PO TABS: 1.0000 | ORAL_TABLET | Freq: Every day | ORAL | 1 refills | Status: DC

## 2021-06-02 NOTE — Progress Notes (Signed)
Subjective:    Patient ID: Luke Wilkerson, male    DOB: 06/19/1986, 35 y.o.   MRN: [id number]  HPI Pt is a 35 yo obese male who presents to the clinic for follow up.   Seen 9/7 for wound infection and insect bite. Pt request a few more days of bactrim to clear up infection. Looks a lot better but still a little red and warm.   Pt has cut back on drinking alcohol but still finds himself drinking to go to sleep. Wonders if help going to sleep will help him cut back on alcohol.   Lots of stress in life. His gf might be pregnant. He has a lot of stress with work going on. He has a lot of anxiety about social situations.   .. Active Ambulatory Problems    Diagnosis Date Noted   Back pain 02/08/2014   Alcohol consumption of more than two drinks per day 03/12/2017   Adult ADHD 03/12/2017   Elevated blood pressure reading 03/12/2017   Moderate episode of recurrent major depressive disorder (HCC) 03/12/2017   Social anxiety disorder 03/12/2017   Snoring 03/12/2017   At risk for obstructive sleep apnea 03/12/2017   Tobacco use 03/12/2017   Hypertension goal BP (blood pressure) < 130/80 04/09/2017   RUQ abdominal pain 05/10/2017   Elevated ALT measurement 05/11/2017   NAFL (nonalcoholic fatty liver) 05/25/2017   Perianal abscess 06/21/2017   Hypertriglyceridemia 04/10/2019   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 05/12/2019   Non-restorative sleep 10/20/2019   Frequent headaches 10/20/2019   Class 1 obesity due to excess calories with serious comorbidity and body mass index (BMI) of 32.0 to 32.9 in adult 10/20/2019   Fracture of right greater tuberosity and inferior glenoid 11/12/2020   Tobacco dependence 03/05/2021   Elevated liver enzymes 03/05/2021   Alcohol abuse 03/05/2021   Alcoholic fatty liver 03/06/2021   Elevated ferritin 03/10/2021   Raised serum iron 03/10/2021   Folate deficiency 03/10/2021   Resolved Ambulatory Problems    Diagnosis Date Noted   No  Resolved Ambulatory Problems   Past Medical History:  Diagnosis Date   ADHD    Anxiety    Hypertension    Obesity    Pneumonia 2016     Review of Systems See HPI.     Objective:   Physical Exam Vitals reviewed.  Constitutional:      Appearance: Normal appearance. He is obese.  HENT:     Head: Normocephalic.  Cardiovascular:     Rate and Rhythm: Normal rate and regular rhythm.     Pulses: Normal pulses.     Heart sounds: Normal heart sounds. No murmur heard. Pulmonary:     Effort: Pulmonary effort is normal.     Breath sounds: Normal breath sounds.  Musculoskeletal:        General: Normal range of motion.  Neurological:     General: No focal deficit present.     Mental Status: He is alert and oriented to person, place, and time.  Psychiatric:        Mood and Affect: Mood normal.   .. Depression screen [facility] 2/9 11/19/2020 03/13/2020 12/13/2019 10/18/2019 08/07/2019  Decreased Interest 1 0 1 3 3   Down, Depressed, Hopeless 1 0 0 3 1  PHQ - 2 Score 2 0 1 6 4   Altered sleeping 0 3 1 3 1   Tired, decreased energy 0 3 1 1 1   Change in appetite 0 1 0 2 0  Feeling  bad or failure about yourself  0 0 0 1 0  Trouble concentrating 1 0 0 3 1  Moving slowly or fidgety/restless 0 0 0 2 0  Suicidal thoughts 0 0 0 1 0  PHQ-9 Score 3 7 3 19 7   Difficult doing work/chores Not difficult at all - Not difficult at all Extremely dIfficult Very difficult  Some recent data might be hidden   . GAD 7 : Generalized Anxiety Score 11/19/2020 03/13/2020 12/13/2019 10/18/2019  Nervous, Anxious, on Edge 1 1 3  0  Control/stop worrying 1 1 3 3   Worry too much - different things 1 2 3 3   Trouble relaxing 1 1 1 3   Restless 1 1 1 1   Easily annoyed or irritable 3 3 3 3   Afraid - awful might happen 1 1 3 3   Total GAD 7 Score 9 10 17 16   Anxiety Difficulty Very difficult - Very difficult Extremely difficult            Assessment & Plan:  10/20/2019 Luke Wilkerson was seen today for follow-up.  Diagnoses and all  orders for this visit:  Social anxiety disorder -     FLUoxetine (PROZAC) 20 MG capsule; Take 1 capsule (20 mg total) by mouth daily. -     clonazePAM (KLONOPIN) 0.5 MG tablet; Take 1 tablet (0.5 mg total) by mouth 2 (two) times daily as needed for anxiety.  Hypertension goal BP (blood pressure) < 130/80 -     telmisartan-hydrochlorothiazide (MICARDIS HCT) 40-12.5 MG tablet; Take 1 tablet by mouth daily. -     amLODipine (NORVASC) 10 MG tablet; Take 1 tablet (10 mg total) by mouth daily.  Adult ADHD -     amphetamine-dextroamphetamine (ADDERALL) 20 MG tablet; Take 1 tablet (20 mg total) by mouth 2 (two) times daily. -     amphetamine-dextroamphetamine (ADDERALL) 20 MG tablet; Take 1 tablet (20 mg total) by mouth 2 (two) times daily. -     amphetamine-dextroamphetamine (ADDERALL) 20 MG tablet; Take 1 tablet (20 mg total) by mouth 2 (two) times daily.  Insomnia, unspecified type -     traZODone (DESYREL) 50 MG tablet; Take 0.5-1 tablets (25-50 mg total) by mouth at bedtime as needed for sleep.  Insect bite of left upper arm, subsequent encounter -     sulfamethoxazole-trimethoprim (BACTRIM DS) 800-160 MG tablet; Take 1 tablet by mouth 2 (two) times daily.   Trazodone for sleep.  Prozac for mood.  Klonapin only as needed and NOT to be combined with alcohol.  BP still not to goal. Take medication every day.

## 2021-07-28 ENCOUNTER — Other Ambulatory Visit: Payer: BC Managed Care – PPO

## 2021-07-28 ENCOUNTER — Ambulatory Visit: Payer: BC Managed Care – PPO | Admitting: Family

## 2021-09-09 ENCOUNTER — Encounter: Payer: Self-pay | Admitting: Physician Assistant

## 2021-09-09 ENCOUNTER — Other Ambulatory Visit: Payer: Self-pay

## 2021-09-09 ENCOUNTER — Ambulatory Visit: Payer: BC Managed Care – PPO | Admitting: Physician Assistant

## 2021-09-09 VITALS — BP 143/92 | HR 104 | Temp 98.1°F | Ht 71.0 in | Wt 235.0 lb

## 2021-09-09 DIAGNOSIS — F4010 Social phobia, unspecified: Secondary | ICD-10-CM | POA: Diagnosis not present

## 2021-09-09 DIAGNOSIS — Z9189 Other specified personal risk factors, not elsewhere classified: Secondary | ICD-10-CM

## 2021-09-09 DIAGNOSIS — Z6832 Body mass index (BMI) 32.0-32.9, adult: Secondary | ICD-10-CM

## 2021-09-09 DIAGNOSIS — L723 Sebaceous cyst: Secondary | ICD-10-CM

## 2021-09-09 DIAGNOSIS — F17200 Nicotine dependence, unspecified, uncomplicated: Secondary | ICD-10-CM

## 2021-09-09 DIAGNOSIS — F909 Attention-deficit hyperactivity disorder, unspecified type: Secondary | ICD-10-CM | POA: Diagnosis not present

## 2021-09-09 DIAGNOSIS — G478 Other sleep disorders: Secondary | ICD-10-CM | POA: Diagnosis not present

## 2021-09-09 DIAGNOSIS — E6609 Other obesity due to excess calories: Secondary | ICD-10-CM

## 2021-09-09 DIAGNOSIS — I10 Essential (primary) hypertension: Secondary | ICD-10-CM

## 2021-09-09 DIAGNOSIS — K700 Alcoholic fatty liver: Secondary | ICD-10-CM

## 2021-09-09 DIAGNOSIS — R0683 Snoring: Secondary | ICD-10-CM

## 2021-09-09 DIAGNOSIS — L089 Local infection of the skin and subcutaneous tissue, unspecified: Secondary | ICD-10-CM

## 2021-09-09 MED ORDER — TELMISARTAN-HCTZ 40-12.5 MG PO TABS: 1.0000 | ORAL_TABLET | Freq: Every day | ORAL | 1 refills | Status: DC

## 2021-09-09 MED ORDER — AMPHETAMINE-DEXTROAMPHETAMINE 20 MG PO TABS: 20.0000 mg | ORAL_TABLET | Freq: Two times a day (BID) | ORAL | 0 refills | Status: DC

## 2021-09-09 MED ORDER — DOXYCYCLINE HYCLATE 100 MG PO TABS: 100.0000 mg | ORAL_TABLET | Freq: Two times a day (BID) | ORAL | 0 refills | Status: DC

## 2021-09-09 MED ORDER — FOLIC ACID 1 MG PO TABS: 1.0000 mg | ORAL_TABLET | Freq: Every day | ORAL | 3 refills | Status: DC

## 2021-09-09 MED ORDER — AMLODIPINE BESYLATE 10 MG PO TABS: 10.0000 mg | ORAL_TABLET | Freq: Every day | ORAL | 1 refills | Status: DC

## 2021-09-09 MED ORDER — FLUOXETINE HCL 20 MG PO CAPS: 20.0000 mg | ORAL_CAPSULE | Freq: Every day | ORAL | 3 refills | Status: DC

## 2021-09-09 MED ORDER — CLINDAMYCIN PHOSPHATE 1 % EX GEL: Freq: Two times a day (BID) | CUTANEOUS | 1 refills | Status: DC

## 2021-09-09 MED ORDER — CLONAZEPAM 0.5 MG PO TABS: 0.5000 mg | ORAL_TABLET | Freq: Two times a day (BID) | ORAL | 1 refills | Status: DC | PRN

## 2021-09-09 NOTE — Progress Notes (Signed)
Subjective:    Patient ID: Luke Wilkerson, male    DOB: Jan 01, 1986, 36 y.o.   MRN: [id number]  HPI Pt is a 36 yo obese male with HTN, alcoholic fatty liver, ADHD, insomnia, social anxiety disorder who presents to the clinic to follow up.   Pt is doing better on trazodone to get to sleep but still wakes up not feeling rested. He continues to snore. He never had sleep study done.   He is smoking 1 pack every 3 days but cutting back on wellbutrin.   He has not had liquor in many months and beer not every day anymore.   His ADHD is controlled. No problems or concerns. Denies any CP, palpitations, headaches or vision changes.   He has a painful abscess in right axilla. No fever, chills, nausea, vomiting. Has hx of these. Not tried to pop.  .. Active Ambulatory Problems    Diagnosis Date Noted   Back pain 02/08/2014   Alcohol consumption of more than two drinks per day 03/12/2017   Adult ADHD 03/12/2017   Elevated blood pressure reading 03/12/2017   Moderate episode of recurrent major depressive disorder (HCC) 03/12/2017   Social anxiety disorder 03/12/2017   Snoring 03/12/2017   At risk for obstructive sleep apnea 03/12/2017   Tobacco use 03/12/2017   Hypertension goal BP (blood pressure) < 130/80 04/09/2017   RUQ abdominal pain 05/10/2017   Elevated ALT measurement 05/11/2017   Perianal abscess 06/21/2017   Hypertriglyceridemia 04/10/2019   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 05/12/2019   Non-restorative sleep 10/20/2019   Frequent headaches 10/20/2019   Class 1 obesity due to excess calories with serious comorbidity and body mass index (BMI) of 32.0 to 32.9 in adult 10/20/2019   Fracture of right greater tuberosity and inferior glenoid 11/12/2020   Tobacco dependence 03/05/2021   Elevated liver enzymes 03/05/2021   Alcohol abuse 03/05/2021   Alcoholic fatty liver 03/06/2021   Elevated ferritin 03/10/2021   Raised serum iron 03/10/2021   Folate deficiency  03/10/2021   Resolved Ambulatory Problems    Diagnosis Date Noted   NAFL (nonalcoholic fatty liver) 05/25/2017   Past Medical History:  Diagnosis Date   ADHD    Anxiety    Hypertension    Obesity    Pneumonia 2016     Review of Systems See HPI.     Objective:   Physical Exam Vitals reviewed.  Constitutional:      Appearance: Normal appearance. He is obese.  HENT:     Head: Normocephalic.  Neck:     Vascular: No carotid bruit.  Cardiovascular:     Rate and Rhythm: Normal rate and regular rhythm.     Pulses: Normal pulses.     Heart sounds: Normal heart sounds.  Pulmonary:     Effort: Pulmonary effort is normal.     Breath sounds: Normal breath sounds.  Lymphadenopathy:     Cervical: No cervical adenopathy.  Skin:    Comments: Right axilla inflammed red and tender sebaceous cyst about 1cm by 1cm.   Neurological:     General: No focal deficit present.     Mental Status: He is alert and oriented to person, place, and time.          Assessment & Plan:  Marland KitchenMarland KitchenAnthone was seen today for follow-up.  Diagnoses and all orders for this visit:  Non-restorative sleep -     Home sleep test  Adult ADHD -     amphetamine-dextroamphetamine (ADDERALL) 20  MG tablet; Take 1 tablet (20 mg total) by mouth 2 (two) times daily. -     amphetamine-dextroamphetamine (ADDERALL) 20 MG tablet; Take 1 tablet (20 mg total) by mouth 2 (two) times daily. -     amphetamine-dextroamphetamine (ADDERALL) 20 MG tablet; Take 1 tablet (20 mg total) by mouth 2 (two) times daily.  Social anxiety disorder -     clonazePAM (KLONOPIN) 0.5 MG tablet; Take 1 tablet (0.5 mg total) by mouth 2 (two) times daily as needed for anxiety. -     FLUoxetine (PROZAC) 20 MG capsule; Take 1 capsule (20 mg total) by mouth daily.  Hypertension goal BP (blood pressure) < 130/80 -     amLODipine (NORVASC) 10 MG tablet; Take 1 tablet (10 mg total) by mouth daily. -     telmisartan-hydrochlorothiazide (MICARDIS HCT)  40-12.5 MG tablet; Take 1 tablet by mouth daily.  At risk for obstructive sleep apnea -     Home sleep test  Snoring -     Home sleep test  Class 1 obesity due to excess calories with serious comorbidity and body mass index (BMI) of 32.0 to 32.9 in adult -     Home sleep test  Tobacco dependence  Alcoholic fatty liver  Infected sebaceous cyst -     doxycycline (VIBRA-TABS) 100 MG tablet; Take 1 tablet (100 mg total) by mouth 2 (two) times daily. -     clindamycin (CLINDAGEL) 1 % gel; Apply topically 2 (two) times daily.  Other orders -     folic acid (FOLVITE) 1 MG tablet; Take 1 tablet (1 mg total) by mouth daily.   Adderall refilled for 3 months. Follow up in 3 months.   BP not to goal. Continue meds for now. Continue to work on healthy lifestyle. Continue to limit alcohol use and stop smoking. Declined wellbutrin refills did not feel like it helped.  Home sleep test ordered for high risk and could help with energy and BP.   Start doxy for inflammed seb cyst. HO given. Continue warm compresses.   Refilled klonapin.

## 2021-09-09 NOTE — Patient Instructions (Signed)
Epidermoid Cyst  An epidermoid cyst, also known as epidermal cyst, is a sac made of skin tissue. The sac contains a substance called keratin. Keratin is a protein that is normally secreted through the hair follicles. When keratin becomes trapped in the top layer of skin (epidermis), it can form an epidermoid cyst. Epidermoid cysts can be found anywhere on your body. These cysts are usually harmless (benign), and they may not cause symptoms unless they become inflamed or infected. What are the causes? This condition may be caused by: A blocked hair follicle. A hair that curls and re-enters the skin instead of growing straight out of the skin (ingrown hair). A blocked pore. Irritated skin. An injury to the skin. Certain conditions that are passed along from parent to child (inherited). Human papillomavirus (HPV). This happens rarely when cysts occur on the bottom of the feet. Long-term (chronic) sun damage to the skin. What increases the risk? The following factors may make you more likely to develop an epidermoid cyst: Having acne. Being male. Having an injury to the skin. Being past puberty. Having certain rare genetic disorders. What are the signs or symptoms? The only symptom of this condition may be a small, painless lump underneath the skin. When an epidermal cyst ruptures, it may become inflamed. True infection in cysts is rare. Symptoms may include: Redness. Inflammation. Tenderness. Warmth. Keratin draining from the cyst. Keratin is grayish-white, bad-smelling substance. Pus draining from the cyst. How is this diagnosed? This condition is diagnosed with a physical exam. In some cases, you may have a sample of tissue (biopsy) taken from your cyst to be examined under a microscope or tested for bacteria. You may be referred to a health care provider who specializes in skin care (dermatologist). How is this treated? If a cyst becomes inflamed, treatment may include: Opening and  draining the cyst, done by a health care provider. After draining, minor surgery to remove the rest of the cyst may be done. Taking antibiotic medicine. Having injections of medicines (steroids) that help to reduce inflammation. Having surgery to remove the cyst. Surgery may be done if the cyst: Becomes large. Bothers you. Has a chance of turning into cancer. Do not try to open a cyst yourself. Follow these instructions at home: Medicines If you were prescribed an antibiotic medicine, take it it as told by your health care provider. Do not stop using the antibiotic even if you start to feel better. Take over-the-counter and prescription medicines only as told by your health care provider. General instructions Keep the area around your cyst clean and dry. Wear loose, dry clothing. Avoid touching your cyst. Check your cyst every day for signs of infection. Check for: Redness, swelling, or pain. Fluid or blood. Warmth. Pus or a bad smell. Keep all follow-up visits. This is important. How is this prevented? Wear clean, dry, clothing. Avoid wearing tight clothing. Keep your skin clean and dry. Take showers or baths every day. Contact a health care provider if: Your cyst develops symptoms of infection. Your condition is not improving or is getting worse. You develop a cyst that looks different from other cysts you have had. You have a fever. Get help right away if: Redness spreads from the cyst into the surrounding area. Summary An epidermoid cyst is a sac made of skin tissue. These cysts are usually harmless (benign), and they may not cause symptoms unless they become inflamed. If a cyst becomes inflamed, treatment may include surgery to open and drain the   cyst, or to remove it. Treatment may also include medicines by mouth or through an injection. Take over-the-counter and prescription medicines only as told by your health care provider. If you were prescribed an antibiotic medicine,  take it as told by your health care provider. Do not stop using the antibiotic even if you start to feel better. Contact a health care provider if your condition is not improving or is getting worse. Keep all follow-up visits as told by your health care provider. This is important. This information is not intended to replace advice given to you by your health care provider. Make sure you discuss any questions you have with your healthcare provider. Document Revised: 11/22/2019 Document Reviewed: 11/22/2019 Elsevier Patient Education  2022 Elsevier Inc.  

## 2021-09-10 ENCOUNTER — Telehealth: Payer: Self-pay

## 2021-09-10 NOTE — Telephone Encounter (Signed)
Medication: clindamycin (CLINDAGEL) 1 % gel Prior authorization submitted via CoverMyMeds on 09/10/2021 [place] submission pending

## 2021-09-15 NOTE — Telephone Encounter (Signed)
Medication: clindamycin (CLINDAGEL) 1 % gel Prior authorization determination received "This product does not require authorization at this time, and may be covered at the pharmacy in accordance with your benefit plan.    Provider aware via this encounter

## 2021-10-06 IMAGING — DX DG SHOULDER 2+V*R*
3 series · 3 of 3 positions shown · non-contrast
Comparison: None.

CLINICAL DATA: Fall

EXAM:
RIGHT SHOULDER - 2+ VIEW

[shoulder grashey]
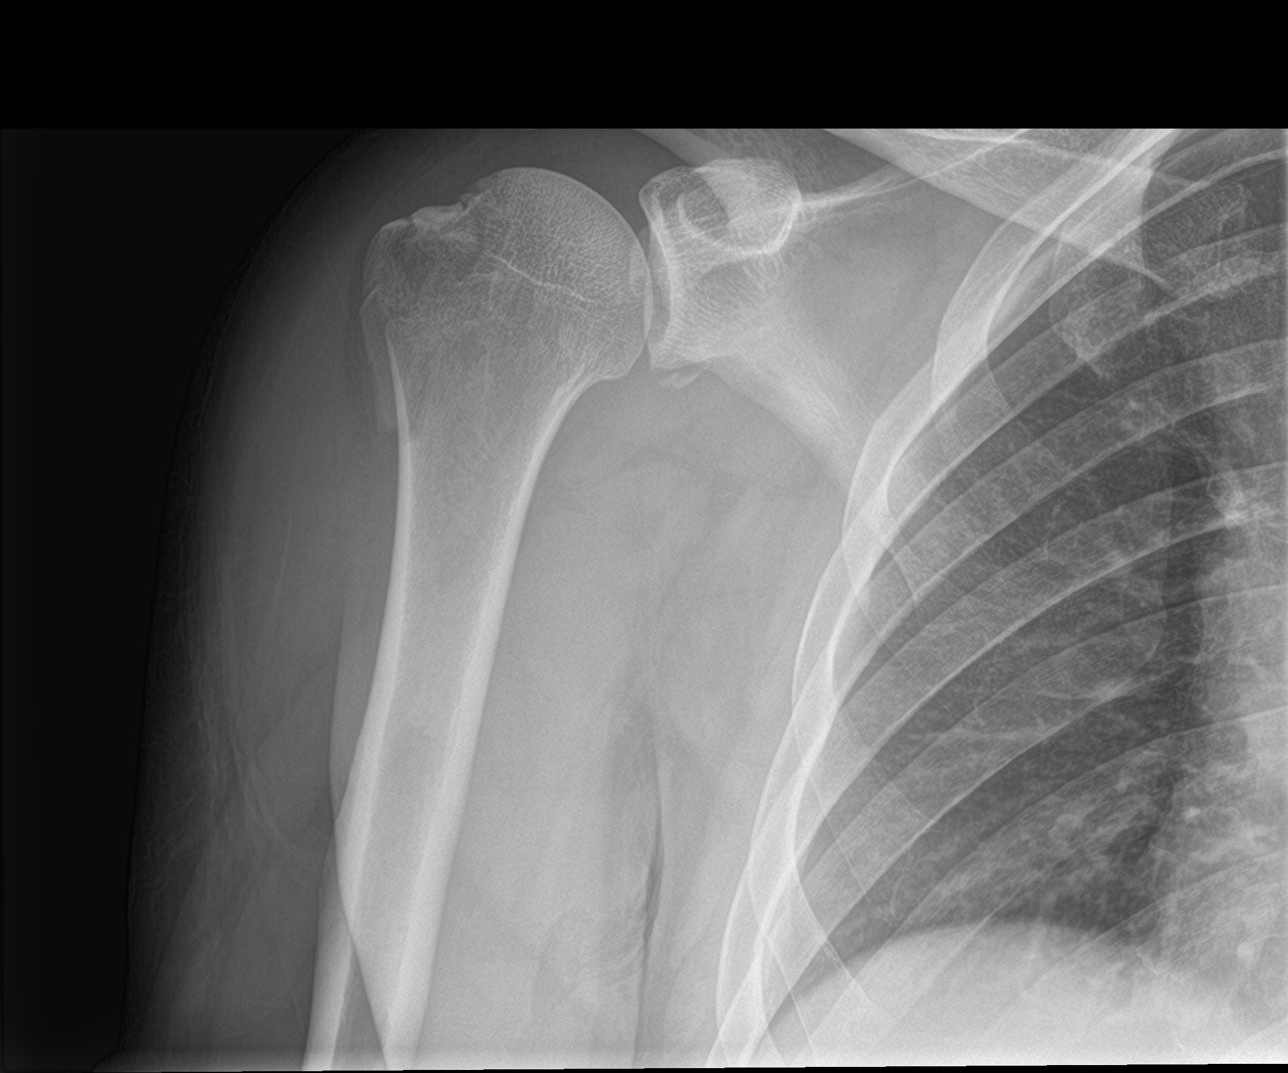

[shoulder y view]
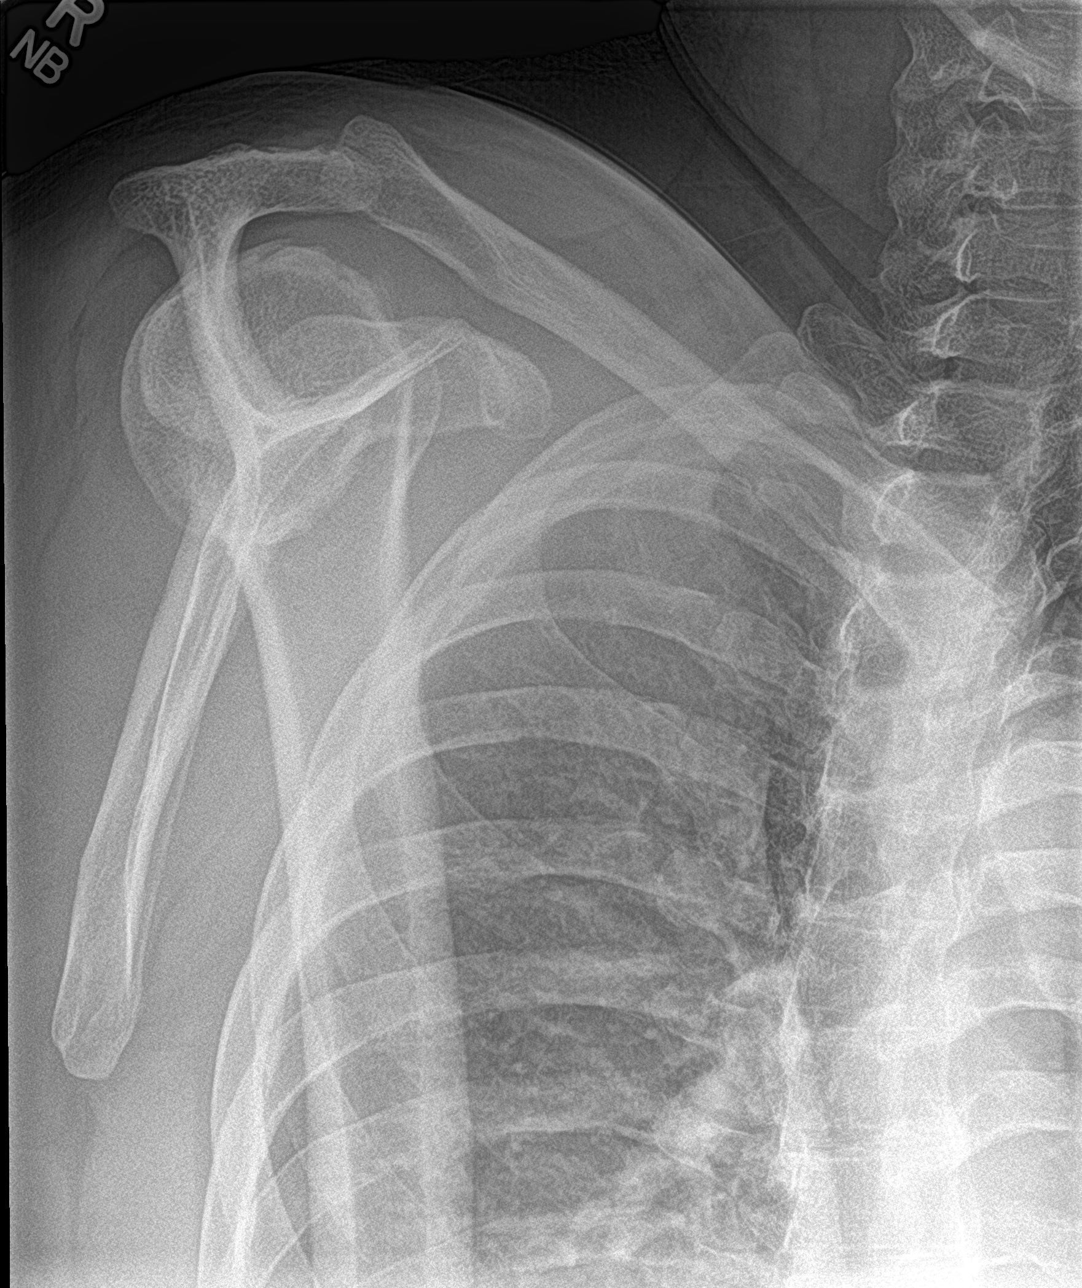

[shoulder axillary]
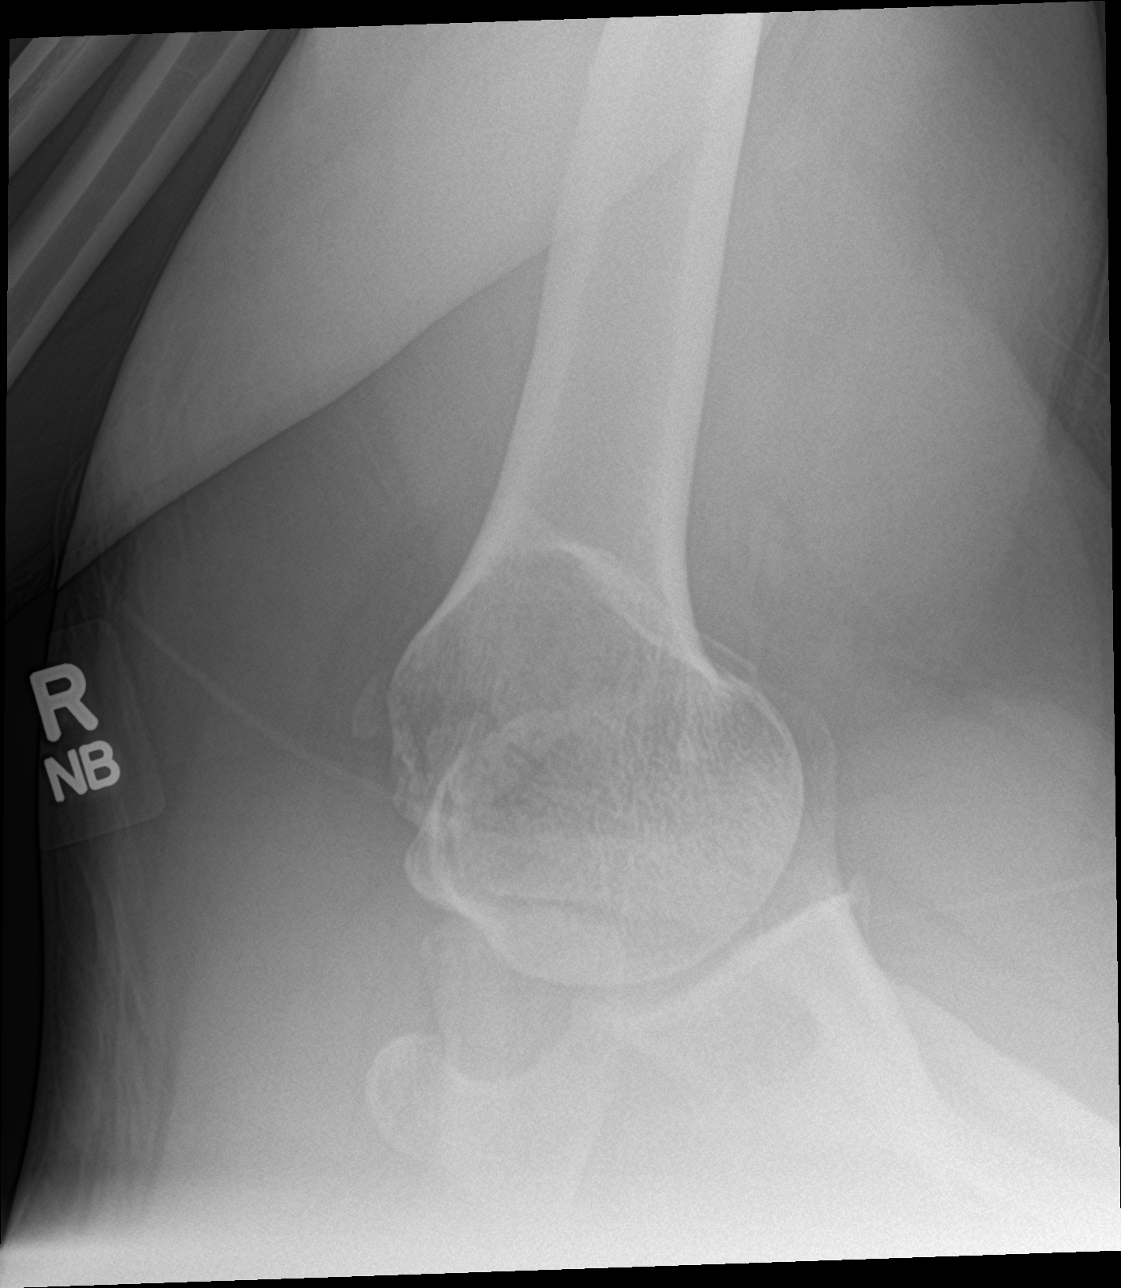

[3 of 3 positions shown; findings below may reference images not displayed]

FINDINGS: AC joint is intact. Acute mildly comminuted and displaced fracture
involving the greater tuberosity of the humerus with fracture
extension to the metaphyseal region. Small osseous fragment adjacent
to the inferior glenoid consistent with Bankart lesion.
IMPRESSION: 1. Acute mildly comminuted and displaced fracture involving the
greater tuberosity of the humerus.
2. Small osseous Bankart lesion.

These results will be called to the ordering clinician or
representative by the Radiologist Assistant, and communication
documented in the PACS or [REDACTED].

## 2021-10-29 ENCOUNTER — Encounter (HOSPITAL_BASED_OUTPATIENT_CLINIC_OR_DEPARTMENT_OTHER): Payer: BC Managed Care – PPO | Admitting: Internal Medicine

## 2021-12-08 ENCOUNTER — Ambulatory Visit: Payer: BC Managed Care – PPO | Admitting: Physician Assistant

## 2022-02-15 IMAGING — US US LIVER ELASTOGRAPHY
1 series · 12 of 25 positions shown · non-contrast
Comparison: Ultrasound May 25, 2017

CLINICAL DATA: Elevated LFTs, history of ETOH abuse

EXAM:
US LIVER ELASTOGRAPHY
TECHNIQUE: Sonography of the liver was performed. In addition, ultrasound
elastography evaluation of the liver was performed. A region of
interest was placed within the right lobe of the liver. Following
application of a compressive sonographic pulse, tissue
compressibility was assessed. Multiple assessments were performed at
the selected site. Median tissue compressibility was determined.
Previously, hepatic stiffness was assessed by shear wave velocity.
Based on recently published Society of Radiologists in Ultrasound
consensus article, reporting is now recommended to be performed in
the SI units of pressure (kiloPascals) representing hepatic
stiffness/elasticity. The obtained result is compared to the
published reference standards. (cACLD = compensated Advanced Chronic
Liver Disease)

[Series 1: us liver elastography · 0.20mm/px · 12 of 39 slices shown]
[im 2/39]
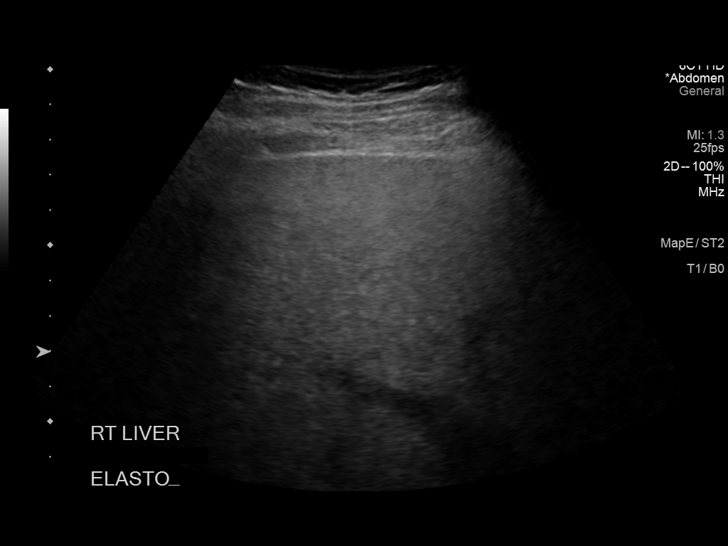
[im 5/39]
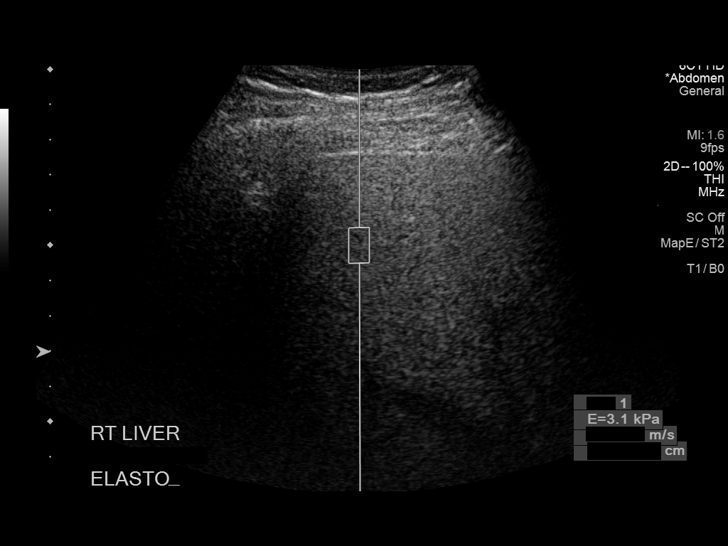
[im 8/39]
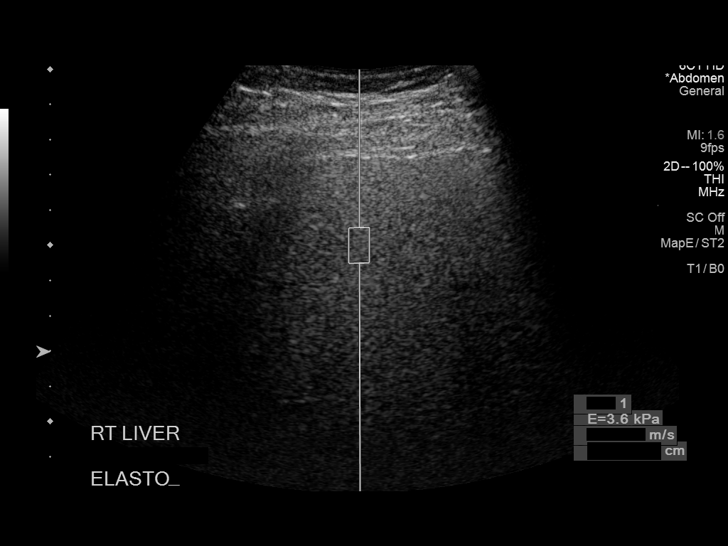
[im 12/39]
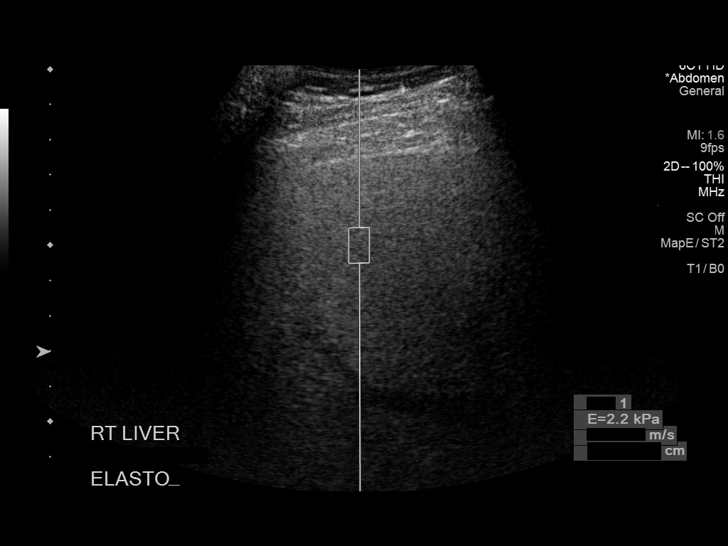
[im 15/39]
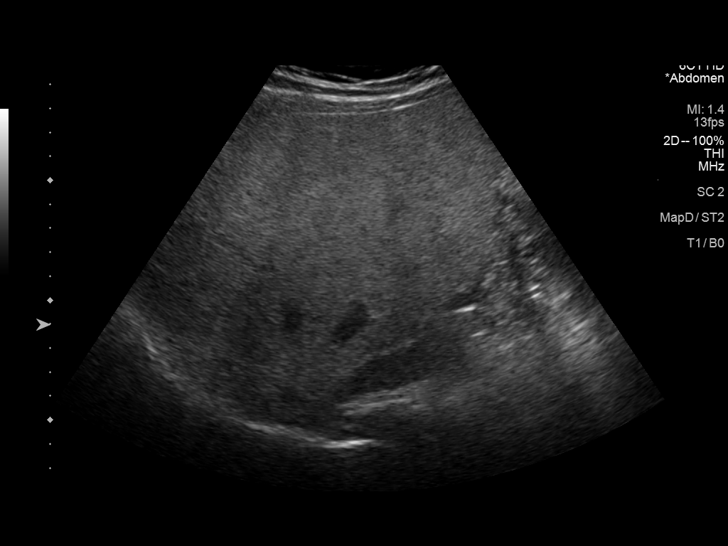
[im 18/39]
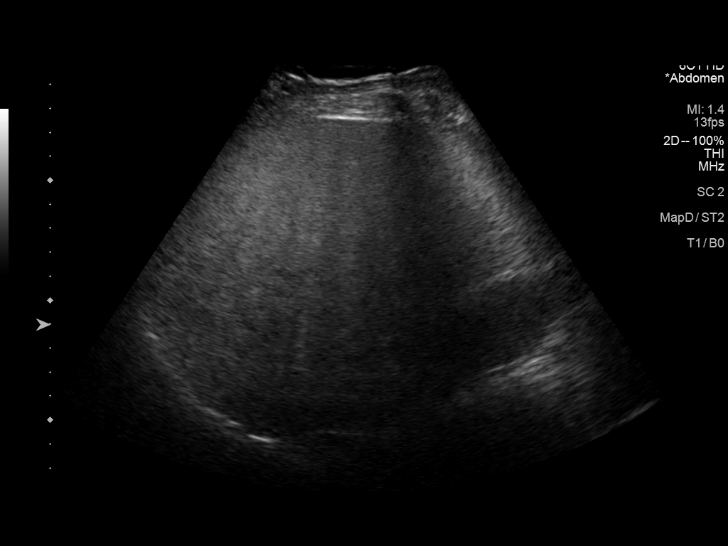
[im 21/39]
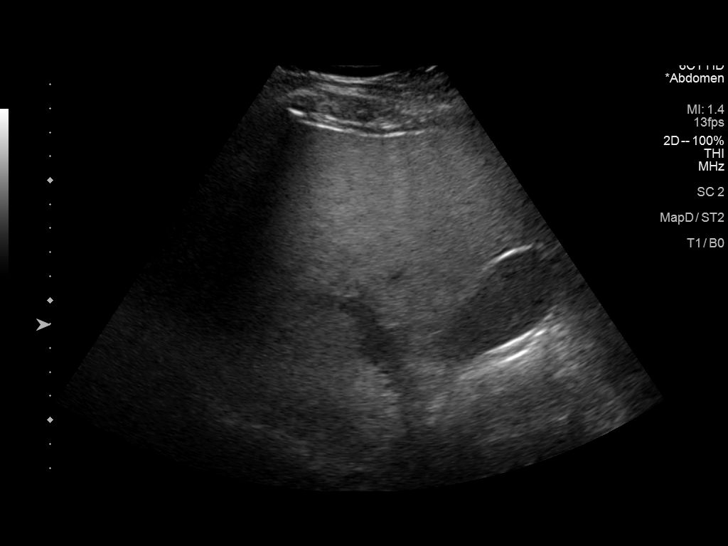
[im 24/39]
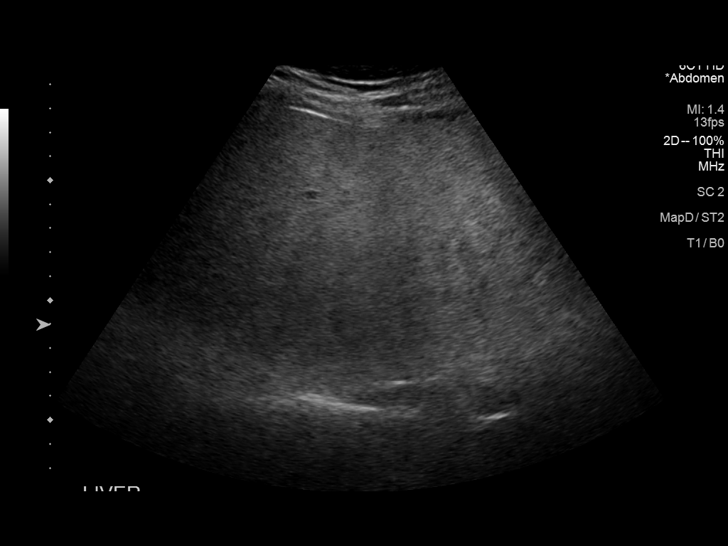
[im 27/39]
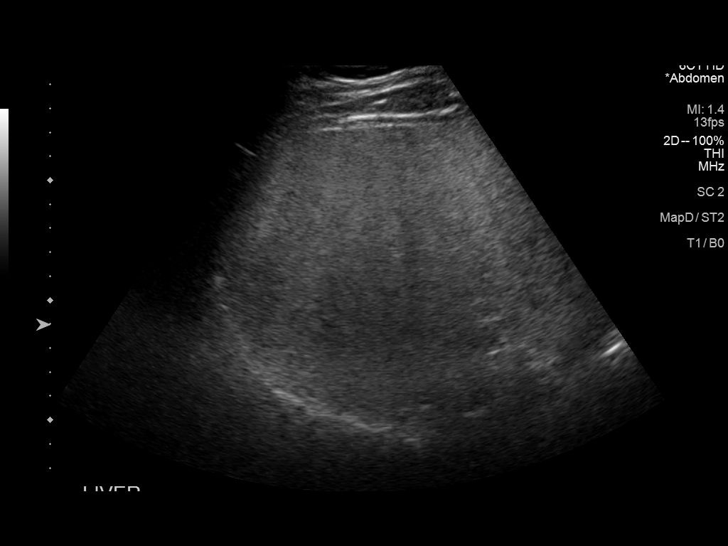
[im 31/39]
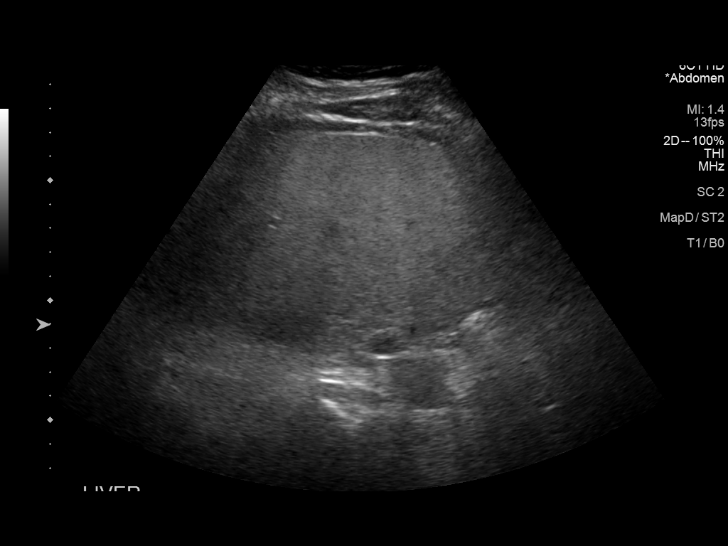
[im 34/39]
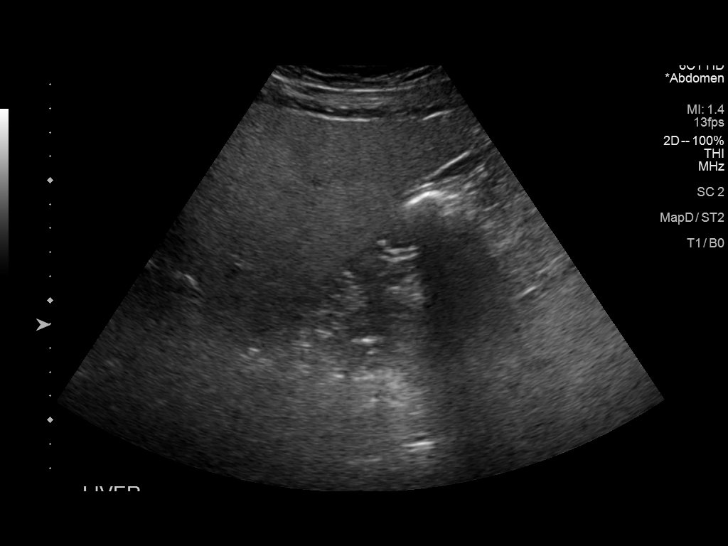
[im 37/39]
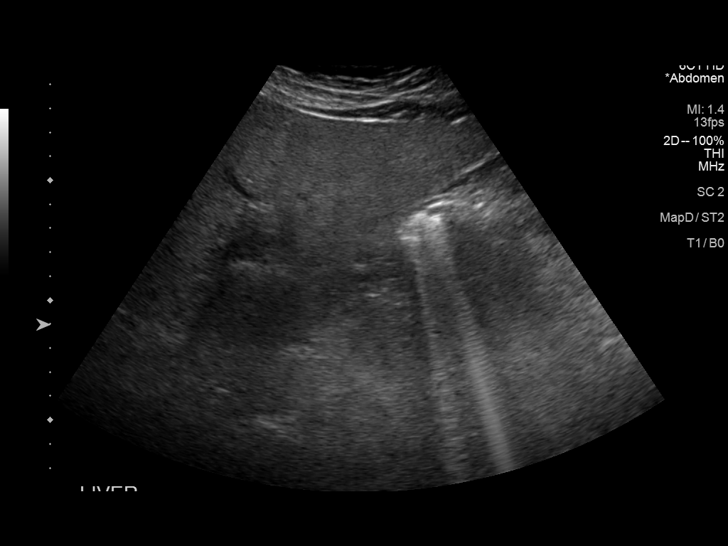

[12 of 25 positions shown; findings below may reference images not displayed]

FINDINGS: Liver: No focal lesion identified. Coarsened hepatic contour with
increased parenchymal echogenicity and decreased acoustic
penetration. Portal vein is patent on color Doppler imaging with
normal direction of blood flow towards the liver.

ULTRASOUND HEPATIC ELASTOGRAPHY

Device: Siemens Helix VTQ

Patient position: Oblique

Transducer: [id number]

Number of measurements: 10

Hepatic segment:  8

Median kPa:

IQR:

IQR/Median kPa ratio:

Data quality: IQR/Median kPa ratio of 0.3 or greater indicates
reduced accuracy

Diagnostic category:  < or = 5 kPa: high probability of being normal

The use of hepatic elastography is applicable to patients with viral
hepatitis and non-alcoholic fatty liver disease. At this time, there
is insufficient data for the referenced cut-off values and use in
other causes of liver disease, including alcoholic liver disease.
Patients, however, may be assessed by elastography and serve as
their own reference standard/baseline.

In patients with non-alcoholic liver disease, the values suggesting
compensated advanced chronic liver disease (cACLD) may be lower, and
patients may need additional testing with elasticity results of [DATE]
kPa.

Please note that abnormal hepatic elasticity and shear wave
velocities may also be identified in clinical settings other than
with hepatic fibrosis, such as: acute hepatitis, elevated right
heart and central venous pressures including use of beta blockers,
Alhaji Musah disease (Mustad), infiltrative processes such as
mastocytosis/amyloidosis/infiltrative tumor/lymphoma, extrahepatic
cholestasis, with hyperemia in the post-prandial state, and with
liver transplantation. Correlation with patient history, laboratory
data, and clinical condition recommended.

Diagnostic Categories:

< or =5 kPa: high probability of being normal

< or =9 kPa: in the absence of other known clinical signs, rules [DATE] kPa and ?13 kPa: suggestive of cACLD, but needs further testing

>13 kPa: highly suggestive of cACLD

> or =17 kPa: highly suggestive of cACLD with an increased
probability of clinically significant portal hypertension
IMPRESSION: ULTRASOUND LIVER:

Coarsened hepatic echotexture with diffusely increased parenchymal
echogenicity and decreased acoustic penetration, nonspecific but
most commonly seen with hepatic steatosis. No discrete hepatic
lesion visualized.

ULTRASOUND HEPATIC ELASTOGRAPHY:

Median kPa:

Diagnostic category:  < or = 5 kPa: high probability of being normal

## 2022-02-23 ENCOUNTER — Other Ambulatory Visit: Payer: Self-pay | Admitting: Neurology

## 2022-02-23 DIAGNOSIS — F909 Attention-deficit hyperactivity disorder, unspecified type: Secondary | ICD-10-CM

## 2022-02-23 MED ORDER — AMPHETAMINE-DEXTROAMPHETAMINE 20 MG PO TABS: 20.0000 mg | ORAL_TABLET | Freq: Two times a day (BID) | ORAL | 0 refills | Status: DC

## 2022-02-27 NOTE — Telephone Encounter (Signed)
Patient called back about medication. LVM letting him know this was sent.

## 2022-03-27 ENCOUNTER — Other Ambulatory Visit: Payer: Self-pay | Admitting: Physician Assistant

## 2022-03-27 DIAGNOSIS — F4010 Social phobia, unspecified: Secondary | ICD-10-CM

## 2022-03-27 DIAGNOSIS — F909 Attention-deficit hyperactivity disorder, unspecified type: Secondary | ICD-10-CM

## 2022-03-27 NOTE — Telephone Encounter (Signed)
Pt called.  He is requesting a refill on his Adderall & ClonazePam. He has an appointment scheduled with [facility] August 16.

## 2022-03-27 NOTE — Telephone Encounter (Signed)
Adderall last written 02/23/2022 - 1 month no refills Clonazepam last written 09/09/2021 #20 with 1 refill  Last visit 09/09/2021, does have visit 04/15/2022 He was warned on Adderall refill in June that he needed appt  Please advise.

## 2022-03-30 MED ORDER — CLONAZEPAM 0.5 MG PO TABS: 0.5000 mg | ORAL_TABLET | Freq: Two times a day (BID) | ORAL | 1 refills | Status: DC | PRN

## 2022-03-30 MED ORDER — AMPHETAMINE-DEXTROAMPHETAMINE 20 MG PO TABS: 20.0000 mg | ORAL_TABLET | Freq: Two times a day (BID) | ORAL | 0 refills | Status: DC

## 2022-03-31 ENCOUNTER — Ambulatory Visit: Payer: BC Managed Care – PPO | Admitting: Sports Medicine

## 2022-04-06 ENCOUNTER — Ambulatory Visit (INDEPENDENT_AMBULATORY_CARE_PROVIDER_SITE_OTHER): Payer: BC Managed Care – PPO | Admitting: Sports Medicine

## 2022-04-06 DIAGNOSIS — M25521 Pain in right elbow: Secondary | ICD-10-CM | POA: Diagnosis not present

## 2022-04-06 MED ORDER — PREDNISONE 50 MG PO TABS: ORAL_TABLET | ORAL | 0 refills | Status: DC

## 2022-04-06 NOTE — Progress Notes (Signed)
    Procedures performed today:    None.  Independent interpretation of notes and tests performed by another provider:   None.  Brief History, Exam, Impression, and Recommendations:    Right elbow pain Pleasant 36 year old male, about 2 and half to 3 months of severe pain right lateral elbow worse with lifting. On exam the elbow is swollen. Tender lateral epicondyles as well as reproduction of pain with resisted supination, this is likely tennis elbow versus distal biceps tendinitis. He does have a new baby about 72 months old. Adding 5 days of prednisone, x-rays, home conditioning, return to see me in 6 weeks, MRI if no better, I am not entirely certain what the primary pain generator is, tennis elbow versus distal biceps versus joint.  Chronic process with exacerbation and pharmacologic intervention  ____________________________________________ Ihor Austin. Benjamin Stain, M.D., ABFM., CAQSM., AME. Primary Care and Sports Medicine [facility]  Adjunct Professor of Family Medicine  [place]  Restaurant manager, fast food

## 2022-04-06 NOTE — Assessment & Plan Note (Signed)
Pleasant 36 year old male, about 2 and half to 3 months of severe pain right lateral elbow worse with lifting. On exam the elbow is swollen. Tender lateral epicondyles as well as reproduction of pain with resisted supination, this is likely tennis elbow versus distal biceps tendinitis. He does have a new baby about 61 months old. Adding 5 days of prednisone, x-rays, home conditioning, return to see me in 6 weeks, MRI if no better, I am not entirely certain what the primary pain generator is, tennis elbow versus distal biceps versus joint.

## 2022-04-15 ENCOUNTER — Ambulatory Visit (INDEPENDENT_AMBULATORY_CARE_PROVIDER_SITE_OTHER): Payer: BC Managed Care – PPO | Admitting: Physician Assistant

## 2022-04-15 ENCOUNTER — Encounter: Payer: Self-pay | Admitting: Physician Assistant

## 2022-04-15 VITALS — BP 148/102 | HR 86 | Ht 71.0 in | Wt 250.0 lb

## 2022-04-15 DIAGNOSIS — R748 Abnormal levels of other serum enzymes: Secondary | ICD-10-CM

## 2022-04-15 DIAGNOSIS — I10 Essential (primary) hypertension: Secondary | ICD-10-CM

## 2022-04-15 DIAGNOSIS — Z1329 Encounter for screening for other suspected endocrine disorder: Secondary | ICD-10-CM

## 2022-04-15 DIAGNOSIS — F909 Attention-deficit hyperactivity disorder, unspecified type: Secondary | ICD-10-CM

## 2022-04-15 DIAGNOSIS — R790 Abnormal level of blood mineral: Secondary | ICD-10-CM | POA: Diagnosis not present

## 2022-04-15 DIAGNOSIS — Z13220 Encounter for screening for lipoid disorders: Secondary | ICD-10-CM | POA: Diagnosis not present

## 2022-04-15 DIAGNOSIS — Z79899 Other long term (current) drug therapy: Secondary | ICD-10-CM

## 2022-04-15 DIAGNOSIS — G4700 Insomnia, unspecified: Secondary | ICD-10-CM

## 2022-04-15 MED ORDER — TELMISARTAN-HCTZ 40-12.5 MG PO TABS: 1.0000 | ORAL_TABLET | Freq: Every day | ORAL | 1 refills | Status: DC

## 2022-04-15 MED ORDER — TRAZODONE HCL 50 MG PO TABS: 25.0000 mg | ORAL_TABLET | Freq: Every evening | ORAL | 1 refills | Status: DC | PRN

## 2022-04-15 MED ORDER — AMLODIPINE BESYLATE 10 MG PO TABS: 10.0000 mg | ORAL_TABLET | Freq: Every day | ORAL | 1 refills | Status: DC

## 2022-04-15 NOTE — Progress Notes (Signed)
Established Patient Office Visit  Subjective   Patient ID: Luke Wilkerson, male    DOB: 11-Oct-1985  Age: 36 y.o. MRN: [id number]  Chief Complaint  Patient presents with   Anxiety   ADHD   Follow-up    HPI Pt is a 36 yo obese male with ADHD, MDD, GAD, HTN who presents to the clinic for follow up.   Pt admits he has not been taking both BP medications. He denies any CP, palpitations, headaches, or vision changes. He does not check BP at home. He sleeps good when he takes trazdone. He admits to drinking alcohol nightly and smoking. He has a new baby at home. No SI/HC. Focus is ok. Work is going well.    .. Active Ambulatory Problems    Diagnosis Date Noted   Back pain 02/08/2014   Alcohol consumption of more than two drinks per day 03/12/2017   Adult ADHD 03/12/2017   Elevated blood pressure reading 03/12/2017   Moderate episode of recurrent major depressive disorder (HCC) 03/12/2017   Social anxiety disorder 03/12/2017   Snoring 03/12/2017   At risk for obstructive sleep apnea 03/12/2017   Tobacco use 03/12/2017   Hypertension goal BP (blood pressure) < 130/80 04/09/2017   RUQ abdominal pain 05/10/2017   Elevated ALT measurement 05/11/2017   Perianal abscess 06/21/2017   Hypertriglyceridemia 04/10/2019   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 05/12/2019   Non-restorative sleep 10/20/2019   Frequent headaches 10/20/2019   Class 1 obesity due to excess calories with serious comorbidity and body mass index (BMI) of 32.0 to 32.9 in adult 10/20/2019   Fracture of right greater tuberosity and inferior glenoid 11/12/2020   Tobacco dependence 03/05/2021   Elevated liver enzymes 03/05/2021   Alcohol abuse 03/05/2021   Alcoholic fatty liver 03/06/2021   Elevated ferritin 03/10/2021   Raised serum iron 03/10/2021   Folate deficiency 03/10/2021   Right elbow pain 04/06/2022   Resolved Ambulatory Problems    Diagnosis Date Noted   NAFL (nonalcoholic fatty liver)  05/25/2017   Past Medical History:  Diagnosis Date   ADHD    Anxiety    Hypertension    Obesity    Pneumonia 2016     ROS See HPI.    Objective:     BP (!) 148/102   Pulse 86   Ht 5\' 11"  (1.803 m)   Wt 250 lb (113.4 kg)   SpO2 98%   BMI 34.87 kg/m  BP Readings from Last 3 Encounters:  04/15/22 (!) 148/102  09/09/21 (!) 143/92  05/07/21 (!) 148/102   Wt Readings from Last 3 Encounters:  04/15/22 250 lb (113.4 kg)  09/09/21 235 lb 0.6 oz (106.6 kg)  06/02/21 228 lb (103.4 kg)    ..    04/15/2022    3:32 PM 11/19/2020    1:23 PM 03/13/2020   10:57 AM 12/13/2019   10:44 AM 10/18/2019    9:26 AM  Depression screen PHQ 2/9  Decreased Interest 0 1 0 1 3  Down, Depressed, Hopeless 0 1 0 0 3  PHQ - 2 Score 0 2 0 1 6  Altered sleeping 3 0 3 1 3   Tired, decreased energy 0 0 3 1 1   Change in appetite 0 0 1 0 2  Feeling bad or failure about yourself  0 0 0 0 1  Trouble concentrating 2 1 0 0 3  Moving slowly or fidgety/restless 0 0 0 0 2  Suicidal thoughts 0 0 0 0  1  PHQ-9 Score 5 3 7 3 19   Difficult doing work/chores Very difficult Not difficult at all  Not difficult at all Extremely dIfficult   .    04/15/2022    3:33 PM 11/19/2020    1:23 PM 03/13/2020   10:58 AM 12/13/2019   10:44 AM  GAD 7 : Generalized Anxiety Score  Nervous, Anxious, on Edge 3 1 1 3   Control/stop worrying 3 1 1 3   Worry too much - different things 3 1 2 3   Trouble relaxing 3 1 1 1   Restless 0 1 1 1   Easily annoyed or irritable 3 3 3 3   Afraid - awful might happen 1 1 1 3   Total GAD 7 Score 16 9 10 17   Anxiety Difficulty Extremely difficult Very difficult  Very difficult      Physical Exam Constitutional:      Appearance: Normal appearance. He is obese.  HENT:     Head: Normocephalic.  Neck:     Vascular: No carotid bruit.  Cardiovascular:     Rate and Rhythm: Normal rate and regular rhythm.     Pulses: Normal pulses.     Heart sounds: Normal heart sounds. No murmur  heard. Pulmonary:     Effort: Pulmonary effort is normal.     Breath sounds: Normal breath sounds.  Musculoskeletal:     Right lower leg: No edema.     Left lower leg: No edema.  Neurological:     General: No focal deficit present.     Mental Status: He is alert and oriented to person, place, and time.  Psychiatric:        Mood and Affect: Mood normal.         Assessment & Plan:  12/15/2019 Kerin was seen today for anxiety, adhd and follow-up.  Diagnoses and all orders for this visit:  Hypertension goal BP (blood pressure) < 130/80 -     COMPLETE METABOLIC PANEL WITH GFR -     telmisartan-hydrochlorothiazide (MICARDIS HCT) 40-12.5 MG tablet; Take 1 tablet by mouth daily. -     amLODipine (NORVASC) 10 MG tablet; Take 1 tablet (10 mg total) by mouth daily.  Elevated liver enzymes -     COMPLETE METABOLIC PANEL WITH GFR  Serum iron raised -     CBC with Differential/Platelet  Lipid screening -     Lipid Panel w/reflex Direct LDL  Thyroid disorder screen -     TSH  Medication management -     TSH -     Lipid Panel w/reflex Direct LDL -     COMPLETE METABOLIC PANEL WITH GFR -     CBC with Differential/Platelet  Insomnia, unspecified type -     traZODone (DESYREL) 50 MG tablet; Take 0.5-1 tablets (25-50 mg total) by mouth at bedtime as needed for sleep.  Adult ADHD -     amphetamine-dextroamphetamine (ADDERALL) 20 MG tablet; Take 1 tablet (20 mg total) by mouth 2 (two) times daily.   BP is NOT controlled.  We must get this controlled.  Micardis and norvasc sent to take daily  Recheck in 1 month. Discussed limiting alcohol and salt and stop smoking.  Consider exercising daily.  Get sleep study to make sure you do not have sleep apnea, referral made twice please call them this time Need some screening labs done.  HO given.   Refilled adderall for one month only until we get BP down.  Trazodone refilled for sleep.  Return in about 4 weeks (around 05/13/2022), or  if symptoms worsen or fail to improve, for recheck BP .    Iran Planas, PA-C

## 2022-04-15 NOTE — Patient Instructions (Addendum)
Please call [facility] to schedule your sleep study. They can be reached at [phone].  Managing Your Hypertension Hypertension, also called high blood pressure, is when the force of the blood pressing against the walls of the arteries is too strong. Arteries are blood vessels that carry blood from your heart throughout your body. Hypertension forces the heart to work harder to pump blood and may cause the arteries to become narrow or stiff. Understanding blood pressure readings A blood pressure reading includes a higher number over a lower number: The first, or top, number is called the systolic pressure. It is a measure of the pressure in your arteries as your heart beats. The second, or bottom number, is called the diastolic pressure. It is a measure of the pressure in your arteries as the heart relaxes. For most people, a normal blood pressure is below 120/80. Your personal target blood pressure may vary depending on your medical conditions, your age, and other factors. Blood pressure is classified into four stages. Based on your blood pressure reading, your health care provider may use the following stages to determine what type of treatment you need, if any. Systolic pressure and diastolic pressure are measured in a unit called millimeters of mercury (mmHg). Normal Systolic pressure: below 120. Diastolic pressure: below 80. Elevated Systolic pressure: 120-129. Diastolic pressure: below 80. Hypertension stage 1 Systolic pressure: 130-139. Diastolic pressure: 80-89. Hypertension stage 2 Systolic pressure: 140 or above. Diastolic pressure: 90 or above. How can this condition affect me? Managing your hypertension is very important. Over time, hypertension can damage the arteries and decrease blood flow to parts of the body, including the brain, heart, and kidneys. Having untreated or uncontrolled hypertension can lead to: A heart attack. A stroke. A  weakened blood vessel (aneurysm). Heart failure. Kidney damage. Eye damage. Memory and concentration problems. Vascular dementia. What actions can I take to manage this condition? Hypertension can be managed by making lifestyle changes and possibly by taking medicines. Your health care provider will help you make a plan to bring your blood pressure within a normal range. You may be referred for counseling on a healthy diet and physical activity. Nutrition  Eat a diet that is high in fiber and potassium, and low in salt (sodium), added sugar, and fat. An example eating plan is called the DASH diet. DASH stands for Dietary Approaches to Stop Hypertension. To eat this way: Eat plenty of fresh fruits and vegetables. Try to fill one-half of your plate at each meal with fruits and vegetables. Eat whole grains, such as whole-wheat pasta, brown rice, or whole-grain bread. Fill about one-fourth of your plate with whole grains. Eat low-fat dairy products. Avoid fatty cuts of meat, processed or cured meats, and poultry with skin. Fill about one-fourth of your plate with lean proteins such as fish, chicken without skin, beans, eggs, and tofu. Avoid pre-made and processed foods. These tend to be higher in sodium, added sugar, and fat. Reduce your daily sodium intake. Many people with hypertension should eat less than 1,500 mg of sodium a day. Lifestyle  Work with your health care provider to maintain a healthy body weight or to lose weight. Ask what an ideal weight is for you. Get at least 30 minutes of exercise that causes your heart to beat faster (aerobic exercise) most days of the week. Activities may include walking, swimming, or biking. Include exercise to strengthen your muscles (resistance exercise), such as weight lifting, as part of your  weekly exercise routine. Try to do these types of exercises for 30 minutes at least 3 days a week. Do not use any products that contain nicotine or tobacco. These  products include cigarettes, chewing tobacco, and vaping devices, such as e-cigarettes. If you need help quitting, ask your health care provider. Control any long-term (chronic) conditions you have, such as high cholesterol or diabetes. Identify your sources of stress and find ways to manage stress. This may include meditation, deep breathing, or making time for fun activities. Alcohol use Do not drink alcohol if: Your health care provider tells you not to drink. You are pregnant, may be pregnant, or are planning to become pregnant. If you drink alcohol: Limit how much you have to: 0-1 drink a day for women. 0-2 drinks a day for men. Know how much alcohol is in your drink. In the [place] one drink equals one 12 oz bottle of beer (355 mL), one 5 oz glass of wine (148 mL), or one 1 oz glass of hard liquor (44 mL). Medicines Your health care provider may prescribe medicine if lifestyle changes are not enough to get your blood pressure under control and if: Your systolic blood pressure is 130 or higher. Your diastolic blood pressure is 80 or higher. Take medicines only as told by your health care provider. Follow the directions carefully. Blood pressure medicines must be taken as told by your health care provider. The medicine does not work as well when you skip doses. Skipping doses also puts you at risk for problems. Monitoring Before you monitor your blood pressure: Do not smoke, drink caffeinated beverages, or exercise within 30 minutes before taking a measurement. Use the bathroom and empty your bladder (urinate). Sit quietly for at least 5 minutes before taking measurements. Monitor your blood pressure at home as told by your health care provider. To do this: Sit with your back straight and supported. Place your feet flat on the floor. Do not cross your legs. Support your arm on a flat surface, such as a table. Make sure your upper arm is at heart level. Each time you measure, take two or  three readings one minute apart and record the results. You may also need to have your blood pressure checked regularly by your health care provider. General information Talk with your health care provider about your diet, exercise habits, and other lifestyle factors that may be contributing to hypertension. Review all the medicines you take with your health care provider because there may be side effects or interactions. Keep all follow-up visits. Your health care provider can help you create and adjust your plan for managing your high blood pressure. Where to find more information National Heart, Lung, and Blood Institute: https://wilson-eaton.com/ American Heart Association: www.heart.org Contact a health care provider if: You think you are having a reaction to medicines you have taken. You have repeated (recurrent) headaches. You feel dizzy. You have swelling in your ankles. You have trouble with your vision. Get help right away if: You develop a severe headache or confusion. You have unusual weakness or numbness, or you feel faint. You have severe pain in your chest or abdomen. You vomit repeatedly. You have trouble breathing. These symptoms may be an emergency. Get help right away. Call 911. Do not wait to see if the symptoms will go away. Do not drive yourself to the hospital. Summary Hypertension is when the force of blood pumping through your arteries is too strong. If this condition is not controlled, it  may put you at risk for serious complications. Your personal target blood pressure may vary depending on your medical conditions, your age, and other factors. For most people, a normal blood pressure is less than 120/80. Hypertension is managed by lifestyle changes, medicines, or both. Lifestyle changes to help manage hypertension include losing weight, eating a healthy, low-sodium diet, exercising more, stopping smoking, and limiting alcohol. This information is not intended to  replace advice given to you by your health care provider. Make sure you discuss any questions you have with your health care provider. Document Revised: 05/01/2021 Document Reviewed: 05/01/2021 Elsevier Patient Education  2023 [employer].

## 2022-04-17 ENCOUNTER — Encounter: Payer: Self-pay | Admitting: Physician Assistant

## 2022-04-17 MED ORDER — AMPHETAMINE-DEXTROAMPHETAMINE 20 MG PO TABS: 20.0000 mg | ORAL_TABLET | Freq: Two times a day (BID) | ORAL | 0 refills | Status: DC

## 2022-04-24 ENCOUNTER — Telehealth: Payer: Self-pay

## 2022-04-24 NOTE — Telephone Encounter (Signed)
Pt called,left vm in regards to having  some issues filling is desyrel, vm was not clear enough to understand, called pt back for clarification  left detailed vm for him to return call to clinic.

## 2022-05-13 ENCOUNTER — Ambulatory Visit: Payer: BC Managed Care – PPO | Admitting: Physician Assistant

## 2022-05-18 ENCOUNTER — Ambulatory Visit: Payer: BC Managed Care – PPO | Admitting: Sports Medicine

## 2022-05-25 ENCOUNTER — Ambulatory Visit (INDEPENDENT_AMBULATORY_CARE_PROVIDER_SITE_OTHER): Payer: BC Managed Care – PPO

## 2022-05-25 ENCOUNTER — Ambulatory Visit: Payer: BC Managed Care – PPO | Admitting: Sports Medicine

## 2022-05-25 DIAGNOSIS — M25521 Pain in right elbow: Secondary | ICD-10-CM | POA: Diagnosis not present

## 2022-05-25 NOTE — Assessment & Plan Note (Signed)
This is a very pleasant 36 year old male, he has now had for 3 months of severe pain right lateral elbow worse with gripping and lifting. We tried some steroids orally at the last visit without much improvement. On exam today he does have significant tenderness at the lateral epicondyle with reproduction of pain with resisted extension of the wrist. I do suspect more of a tennis elbow type picture today, due to failure of conservative treatment we proceeded with an injection today, continue home conditioning, get the x-rays on the way out of here. Return to see me in 6 weeks.

## 2022-05-25 NOTE — Progress Notes (Signed)
    Procedures performed today:    Procedure: Real-time Ultrasound Guided injection of the right common extensor tendon origin Device: Samsung HS60  Verbal informed consent obtained.  Time-out conducted.  Noted no overlying erythema, induration, or other signs of local infection.  Skin prepped in a sterile fashion.  Local anesthesia: Topical Ethyl chloride.  With sterile technique and under real time ultrasound guidance: Extensor tearing noted, 1 cc Kenalog 40, 1 cc lidocaine, 1 cc bupivacaine injected easily Completed without difficulty  Advised to call if fevers/chills, erythema, induration, drainage, or persistent bleeding.  Images permanently stored and available for review in PACS.  Impression: Technically successful ultrasound guided injection.  Independent interpretation of notes and tests performed by another provider:   None.  Brief History, Exam, Impression, and Recommendations:    Right elbow pain This is a very pleasant 36 year old male, he has now had for 3 months of severe pain right lateral elbow worse with gripping and lifting. We tried some steroids orally at the last visit without much improvement. On exam today he does have significant tenderness at the lateral epicondyle with reproduction of pain with resisted extension of the wrist. I do suspect more of a tennis elbow type picture today, due to failure of conservative treatment we proceeded with an injection today, continue home conditioning, get the x-rays on the way out of here. Return to see me in 6 weeks.    ____________________________________________ Gwen Her. Dianah Field, M.D., ABFM., CAQSM., AME. Primary Care and Sports Medicine [facility]  Adjunct Professor of [place] [employer] [facility] [employer]  Risk manager

## 2022-06-05 ENCOUNTER — Other Ambulatory Visit: Payer: Self-pay | Admitting: Neurology

## 2022-06-05 DIAGNOSIS — F909 Attention-deficit hyperactivity disorder, unspecified type: Secondary | ICD-10-CM

## 2022-06-05 NOTE — Telephone Encounter (Signed)
Adderall last written 04/17/2022 for one month.  Last appt 04/15/2022 Please advise on refill  Home Sleep Study ordered 09/15/2021 for [facility].

## 2022-06-05 NOTE — Telephone Encounter (Signed)
Patient LVM wanting refill on Adderall and contact information about a sleep study.

## 2022-06-08 MED ORDER — AMPHETAMINE-DEXTROAMPHETAMINE 20 MG PO TABS: 20.0000 mg | ORAL_TABLET | Freq: Two times a day (BID) | ORAL | 0 refills | Status: DC

## 2022-06-09 ENCOUNTER — Other Ambulatory Visit: Payer: Self-pay

## 2022-06-09 DIAGNOSIS — F4010 Social phobia, unspecified: Secondary | ICD-10-CM

## 2022-06-09 NOTE — Telephone Encounter (Signed)
Last visit 04/15/22 last fill 03/30/22

## 2022-06-09 NOTE — Telephone Encounter (Signed)
Left a message for a return call.

## 2022-06-09 NOTE — Telephone Encounter (Signed)
Please call patient and see if he ever picked up the refill.  Per the PDMP it looks like he filled the original prescription in July but never actually went to pick up the refill.

## 2022-07-06 ENCOUNTER — Ambulatory Visit: Payer: BC Managed Care – PPO | Admitting: Sports Medicine

## 2022-07-08 ENCOUNTER — Ambulatory Visit (HOSPITAL_BASED_OUTPATIENT_CLINIC_OR_DEPARTMENT_OTHER): Payer: BC Managed Care – PPO | Attending: Physician Assistant | Admitting: Internal Medicine

## 2022-07-08 VITALS — Ht 72.0 in | Wt 250.0 lb

## 2022-07-08 DIAGNOSIS — G478 Other sleep disorders: Secondary | ICD-10-CM | POA: Insufficient documentation

## 2022-07-08 DIAGNOSIS — G4733 Obstructive sleep apnea (adult) (pediatric): Secondary | ICD-10-CM

## 2022-07-08 DIAGNOSIS — Z6835 Body mass index (BMI) 35.0-35.9, adult: Secondary | ICD-10-CM | POA: Insufficient documentation

## 2022-07-08 DIAGNOSIS — E6609 Other obesity due to excess calories: Secondary | ICD-10-CM | POA: Diagnosis not present

## 2022-07-08 DIAGNOSIS — R0683 Snoring: Secondary | ICD-10-CM | POA: Insufficient documentation

## 2022-07-08 DIAGNOSIS — Z9189 Other specified personal risk factors, not elsewhere classified: Secondary | ICD-10-CM

## 2022-07-12 DIAGNOSIS — Z6832 Body mass index (BMI) 32.0-32.9, adult: Secondary | ICD-10-CM

## 2022-07-12 DIAGNOSIS — G478 Other sleep disorders: Secondary | ICD-10-CM

## 2022-07-12 DIAGNOSIS — Z9189 Other specified personal risk factors, not elsewhere classified: Secondary | ICD-10-CM | POA: Diagnosis not present

## 2022-07-12 DIAGNOSIS — R0683 Snoring: Secondary | ICD-10-CM | POA: Diagnosis not present

## 2022-07-12 DIAGNOSIS — E6609 Other obesity due to excess calories: Secondary | ICD-10-CM

## 2022-07-12 NOTE — Procedures (Signed)
     Patient Name: Luke Wilkerson, Luke Wilkerson Date: 07/08/2022 Gender: Male D.O.B: Sep 22, 1985 Age (years): 36 Referring Provider: Tandy Gaw Height (inches): 71 Interpreting Physician: Jetty Duhamel MD, ABSM Weight (lbs): 250 RPSGT: Elaina Pattee BMI: 35 MRN: [id number] Neck Size: 18.00  CLINICAL INFORMATION Sleep Study Type: HST Indication for sleep study: Snoring Epworth Sleepiness Score: N/A  SLEEP STUDY TECHNIQUE A multi-channel overnight portable sleep study was performed. The channels recorded were: nasal airflow, thoracic respiratory movement, and oxygen saturation with a pulse oximetry. Snoring was also monitored.  MEDICATIONS Patient self administered medications include: none reported.  SLEEP ARCHITECTURE Patient was studied for 319.4 minutes. The sleep efficiency was 61.6 % and the patient was supine for 0%. The arousal index was 0.0 per hour.  RESPIRATORY PARAMETERS The overall AHI was 74.8 per hour, with a central apnea index of 0 per hour. The oxygen nadir was 74% during sleep.  CARDIAC DATA Mean heart rate during sleep was 79.9 bpm.  IMPRESSIONS - Severe obstructive sleep apnea occurred during this study (AHI = 74.8/h). - Oxygen desaturation was noted during this study (Min O2 = 74%). Mean 94% - Patient snored.  DIAGNOSIS - Obstructive Sleep Apnea (G47.33)  RECOMMENDATIONS - Suggest CPAP titration sleep study or autopap. Other options would be based on clinical judgment. - Be careful with alcohol, sedatives and other CNS depressants that may worsen sleep apnea and disrupt normal sleep architecture. - Sleep hygiene should be reviewed to assess factors that may improve sleep quality. - Weight management and regular exercise should be initiated or continued.  [Electronically signed] 07/12/2022 01:54 PM  Jetty Duhamel MD, ABSM Diplomate, American Board of Sleep Medicine NPI: [id number]                        Jetty Duhamel Diplomate, American Board of Sleep Medicine  ELECTRONICALLY SIGNED ON:  07/12/2022, 1:52 PM [facility] PH: [phone]   FX: [phone] ACCREDITED BY THE AMERICAN ACADEMY OF SLEEP MEDICINE

## 2022-07-14 DIAGNOSIS — G4733 Obstructive sleep apnea (adult) (pediatric): Secondary | ICD-10-CM | POA: Insufficient documentation

## 2022-07-14 NOTE — Addendum Note (Signed)
Addended by: Jomarie Longs on: 07/14/2022 05:28 PM   Modules accepted: Orders

## 2022-07-15 ENCOUNTER — Telehealth: Payer: Self-pay | Admitting: Neurology

## 2022-07-15 DIAGNOSIS — G4733 Obstructive sleep apnea (adult) (pediatric): Secondary | ICD-10-CM

## 2022-07-15 NOTE — Telephone Encounter (Signed)
Patient made aware of results and that CPAP has been ordered. Order placed and message sent to [facility] and Dimas Millin). They will contact patient to get him started.

## 2022-07-15 NOTE — Telephone Encounter (Signed)
-----   Message from Jomarie Longs, [place] sent at 07/14/2022  5:28 PM EST ----- Sleep study showed sleep apnea. Order to be sent to [facility] and started on CPAP therapy. Follow up within one month of starting CPAP therapy.  ----- Message ----- From: Waymon Budge, MD Sent: 07/12/2022   1:56 PM EST To: Jomarie Longs, PA-C

## 2022-07-16 ENCOUNTER — Telehealth: Payer: Self-pay | Admitting: Neurology

## 2022-07-16 NOTE — Telephone Encounter (Signed)
Received request for Adderall refill from the pharmacy Last written 06/08/2022 for one month with no refills Last appt 04/15/2022, told to follow up in 4 weeks.   Please call patient to make a follow up appt for adderall refills.

## 2022-07-16 NOTE — Telephone Encounter (Signed)
Called patient and the patient is very upset that he has to come in every month for 1 month supply of Adderall. He stated that when Dr. Rosita Kea was his doctor he never had this problem. He would get several months supply. He stated that he thinks it's all about the money and he has to come out of his pocket every time he comes in here. He also stated that he will find another provider if this continues. He stated that he has called several times to talk about his BP medicine that he's was taking, it was stinging and shooting pain through his feet, so he stopped taking it and it no longer hurts. I asked if we could still make the appointment to check his BP and get medication that works for him and he stated no. I replied that I would send a email to get advised on what to do next, and I would give him a call back. Please advise. tvt

## 2022-07-16 NOTE — Telephone Encounter (Signed)
Patient is usually seen every 4 weeks, but due to increase blood pressure was told to follow up in one month at his visit in August. He has not been seen since that time, so it has been at the 3 month mark that he is required to have for controlled substances. Eda Magnussen - Please advise.

## 2022-07-21 NOTE — Telephone Encounter (Signed)
See note from [facility] to let patient know. If he would like to follow up here he would need a visit to get blood pressure under control. Once blood pressure is under control he is only required to follow up every 3 months for Adderall. If he does not want to see [place] for management he can let us know. Thanks.

## 2022-07-22 NOTE — Telephone Encounter (Signed)
Patient scheduled for 07/27/2022 @ 8:50 for BP check and med refill. Tvt

## 2022-07-27 ENCOUNTER — Encounter: Payer: Self-pay | Admitting: Physician Assistant

## 2022-07-27 ENCOUNTER — Ambulatory Visit (INDEPENDENT_AMBULATORY_CARE_PROVIDER_SITE_OTHER): Payer: BC Managed Care – PPO | Admitting: Physician Assistant

## 2022-07-27 VITALS — BP 142/90 | HR 77 | Ht 72.0 in | Wt 246.0 lb

## 2022-07-27 DIAGNOSIS — G4733 Obstructive sleep apnea (adult) (pediatric): Secondary | ICD-10-CM

## 2022-07-27 DIAGNOSIS — I10 Essential (primary) hypertension: Secondary | ICD-10-CM

## 2022-07-27 DIAGNOSIS — F411 Generalized anxiety disorder: Secondary | ICD-10-CM | POA: Insufficient documentation

## 2022-07-27 DIAGNOSIS — F4010 Social phobia, unspecified: Secondary | ICD-10-CM

## 2022-07-27 DIAGNOSIS — F909 Attention-deficit hyperactivity disorder, unspecified type: Secondary | ICD-10-CM

## 2022-07-27 DIAGNOSIS — G4700 Insomnia, unspecified: Secondary | ICD-10-CM | POA: Diagnosis not present

## 2022-07-27 MED ORDER — AMPHETAMINE-DEXTROAMPHETAMINE 20 MG PO TABS: 20.0000 mg | ORAL_TABLET | Freq: Two times a day (BID) | ORAL | 0 refills | Status: DC

## 2022-07-27 MED ORDER — TRAZODONE HCL 50 MG PO TABS: 25.0000 mg | ORAL_TABLET | Freq: Every evening | ORAL | 1 refills | Status: DC | PRN

## 2022-07-27 MED ORDER — LOSARTAN POTASSIUM-HCTZ 50-12.5 MG PO TABS: 1.0000 | ORAL_TABLET | Freq: Every day | ORAL | 0 refills | Status: DC

## 2022-07-27 MED ORDER — CLONAZEPAM 0.5 MG PO TABS: 0.5000 mg | ORAL_TABLET | Freq: Two times a day (BID) | ORAL | 1 refills | Status: DC | PRN

## 2022-07-27 NOTE — Addendum Note (Signed)
Addended by: Jomarie Longs on: 07/27/2022 01:19 PM   Modules accepted: Orders, Level of Service

## 2022-07-27 NOTE — Patient Instructions (Addendum)
Start hyzaar for BP.  Call about CPAP machine, order was sent to [facility], they can be reached at [phone]

## 2022-07-27 NOTE — Addendum Note (Signed)
Addended bySilvio Pate on: 07/27/2022 10:55 AM   Modules accepted: Orders

## 2022-07-27 NOTE — Progress Notes (Signed)
Thinks Amlodipine caused burning in feet Telmisartan-HCTZ 40-12.5 mg too expensive Prozac 20 mg felt like it "wasn't doing anything"

## 2022-07-27 NOTE — Progress Notes (Addendum)
Established Patient Office Visit  Subjective   Patient ID: Luke Wilkerson, male    DOB: [birth year]  Age: 36 y.o. MRN: [id number]  Chief Complaint  Patient presents with   Hypertension   ADHD    HPI Pt is a 36 yo male with ADHD, HTN, GAD, OSA who presents to the clinic for follow up.   Pt has not had adderall due to elevated BP and not coming in for follow up. He has had more anxiety due to this. 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   He was not able to tolerate norvasc due to "feet burning". Telimasartan/HcTZ was too expensive. Prozac did not help his mood. Pt has not been called about CPAP. No CP, palpitations, headaches, vision changes or lower extremity swelling. He has had some upper abdominal cramping but relieved with tonic water. Denies any reflux symptoms.    Active Ambulatory Problems    Diagnosis Date Noted   Back pain 02/08/2014   Alcohol consumption of more than two drinks per day 03/12/2017   Adult ADHD 03/12/2017   Elevated blood pressure reading 03/12/2017   Moderate episode of recurrent major depressive disorder (HCC) 03/12/2017   Social anxiety disorder 03/12/2017   Snoring 03/12/2017   At risk for obstructive sleep apnea 03/12/2017   Tobacco use 03/12/2017   Hypertension goal BP (blood pressure) < 130/80 04/09/2017   RUQ abdominal pain 05/10/2017   Elevated ALT measurement 05/11/2017   Perianal abscess 06/21/2017   Hypertriglyceridemia 04/10/2019   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 05/12/2019   Non-restorative sleep 10/20/2019   Frequent headaches 10/20/2019   Class 1 obesity due to excess calories with serious comorbidity and body mass index (BMI) of 32.0 to 32.9 in adult 10/20/2019   Fracture of right greater tuberosity and inferior glenoid 11/12/2020   Tobacco dependence 03/05/2021   Elevated liver enzymes 03/05/2021   Alcohol abuse [id number]   Alcoholic fatty liver [id number]   Elevated ferritin 03/10/2021   Raised serum  iron 03/10/2021   Folate deficiency 03/10/2021   Right elbow pain 04/06/2022   OSA (obstructive sleep apnea) 07/14/2022   GAD (generalized anxiety disorder) 07/27/2022   Insomnia 07/27/2022   Resolved Ambulatory Problems    Diagnosis Date Noted   NAFL (nonalcoholic fatty liver) [id number]   Past Medical History:  Diagnosis Date   ADHD    Anxiety    Hypertension    Obesity    Pneumonia 2016     ROS See HPI.    Objective:     BP (!) 142/90   Pulse 77   Ht 6' (1.829 m)   Wt 246 lb (111.6 kg)   SpO2 97%   BMI 33.36 kg/m  BP Readings from Last 3 Encounters:  07/27/22 (!) 142/90  04/15/22 (!) 148/102  09/09/21 (!) 143/92   Wt Readings from Last 3 Encounters:  07/27/22 246 lb (111.6 kg)  07/08/22 250 lb (113.4 kg)  04/15/22 250 lb (113.4 kg)     ..    07/27/2022    9:16 AM 04/15/2022    3:32 PM 11/19/2020    1:23 PM 03/13/2020   10:57 AM 12/13/2019   10:44 AM  Depression screen PHQ 2/9  Decreased Interest 0 0 1 0 1  Down, Depressed, Hopeless 2 0 1 0 0  PHQ - 2 Score 2 0 2 0 1  Altered sleeping 0 3 0 3 1  Tired, decreased energy 0 0 0 3 1  Change in  appetite 0 0 0 1 0  Feeling bad or failure about yourself  0 0 0 0 0  Trouble concentrating 3 2 1  0 0  Moving slowly or fidgety/restless 0 0 0 0 0  Suicidal thoughts 0 0 0 0 0  PHQ-9 Score 5 5 3 7 3   Difficult doing work/chores Somewhat difficult Very difficult Not difficult at all  Not difficult at all   .    07/27/2022    9:17 AM 04/15/2022    3:33 PM 11/19/2020    1:23 PM 03/13/2020   10:58 AM  GAD 7 : Generalized Anxiety Score  Nervous, Anxious, on Edge 3 3 1 1   Control/stop worrying 3 3 1 1   Worry too much - different things 3 3 1 2   Trouble relaxing 3 3 1 1   Restless 3 0 1 1  Easily annoyed or irritable 3 3 3 3   Afraid - awful might happen 3 1 1 1   Total GAD 7 Score 21 16 9 10   Anxiety Difficulty Somewhat difficult Extremely difficult Very difficult      Physical Exam Constitutional:       Appearance: Normal appearance. He is obese.  Cardiovascular:     Rate and Rhythm: Normal rate and regular rhythm.     Pulses: Normal pulses.     Heart sounds: Normal heart sounds.  Pulmonary:     Effort: Pulmonary effort is normal.     Breath sounds: Normal breath sounds.  Musculoskeletal:     Right lower leg: No edema.     Left lower leg: No edema.  Neurological:     General: No focal deficit present.     Mental Status: He is alert and oriented to person, place, and time.  Psychiatric:        Mood and Affect: Mood normal.           Assessment & Plan:  3/24/20227/16/2021[name] was seen today for hypertension and adhd.  Diagnoses and all orders for this visit:  Adult ADHD -     amphetamine-dextroamphetamine (ADDERALL) 20 MG tablet; Take 1 tablet (20 mg total) by mouth 2 (two) times daily. -     amphetamine-dextroamphetamine (ADDERALL) 20 MG tablet; Take 1 tablet (20 mg total) by mouth 2 (two) times daily. -     amphetamine-dextroamphetamine (ADDERALL) 20 MG tablet; Take 1 tablet (20 mg total) by mouth 2 (two) times daily.  Insomnia, unspecified type -     traZODone (DESYREL) 50 MG tablet; Take 0.5-1 tablets (25-50 mg total) by mouth at bedtime as needed for sleep.  OSA (obstructive sleep apnea)  Hypertension goal BP (blood pressure) < 130/80 -     losartan-hydrochlorothiazide (HYZAAR) 50-12.5 MG tablet; Take 1 tablet by mouth daily.  GAD (generalized anxiety disorder)   BP is better but not to goal Start hyzaar Keep up with BP at home Continue low salt diet Adderall refilled  Follow up in 3 months  Trazodone refilled for sleep Not been called about CPAP machine Number given for patient to reach out  GAD terrible but patient thinks a lot of it is not having adderall Will continue to monitor Per patient prozac did not help at all Refilled klonapin as needed    Return in about 3 months (around 10/27/2022).    , PA-C

## 2022-09-22 ENCOUNTER — Telehealth: Payer: Self-pay | Admitting: Neurology

## 2022-09-22 NOTE — Telephone Encounter (Signed)
Patient called for Adderall refill and to get contact information to get CPAP.   Adderall already at pharmacy and can be picked up 09/26/2022.  [facility] contact number [phone].   Called patient back - made aware of information above.

## 2022-10-07 ENCOUNTER — Ambulatory Visit (INDEPENDENT_AMBULATORY_CARE_PROVIDER_SITE_OTHER): Payer: BC Managed Care – PPO | Admitting: Sports Medicine

## 2022-10-07 DIAGNOSIS — J340 Abscess, furuncle and carbuncle of nose: Secondary | ICD-10-CM | POA: Diagnosis not present

## 2022-10-07 MED ORDER — MUPIROCIN 2 % EX OINT: TOPICAL_OINTMENT | CUTANEOUS | 3 refills | Status: DC

## 2022-10-07 MED ORDER — DOXYCYCLINE HYCLATE 100 MG PO TABS: 100.0000 mg | ORAL_TABLET | Freq: Two times a day (BID) | ORAL | 0 refills | Status: AC

## 2022-10-07 NOTE — Assessment & Plan Note (Signed)
Pleasant 37 year old male, has had a few weeks of increasing pain, swelling, yellowish discharge from the right nasal nare at the tip. On exam he does have yellowish crusty deposits that appear impetiginous. He has severe tenderness to palpation. He has no superficial or spreading erythema on the outside of the nose. I explained that this was likely nasal cellulitis, usually staphylococcal, we will do topical mupirocin as well as oral doxycycline, return to see me in a couple of weeks.

## 2022-10-07 NOTE — Progress Notes (Signed)
    Procedures performed today:    None.  Independent interpretation of notes and tests performed by another provider:   None.  Brief History, Exam, Impression, and Recommendations:    Cellulitis of nasal tip Pleasant 37 year old male, has had a few weeks of increasing pain, swelling, yellowish discharge from the right nasal nare at the tip. On exam he does have yellowish crusty deposits that appear impetiginous. He has severe tenderness to palpation. He has no superficial or spreading erythema on the outside of the nose. I explained that this was likely nasal cellulitis, usually staphylococcal, we will do topical mupirocin as well as oral doxycycline, return to see me in a couple of weeks.    ____________________________________________ Gwen Her. Dianah Field, M.D., ABFM., CAQSM., AME. Primary Care and Sports Medicine [facility]  Adjunct Professor of [employer] [facility] [employer] [facility] [employer]  Risk manager

## 2022-10-22 ENCOUNTER — Ambulatory Visit: Payer: BC Managed Care – PPO | Admitting: Sports Medicine

## 2022-10-22 ENCOUNTER — Other Ambulatory Visit: Payer: Self-pay | Admitting: Neurology

## 2022-10-22 DIAGNOSIS — I10 Essential (primary) hypertension: Secondary | ICD-10-CM

## 2022-10-22 MED ORDER — LOSARTAN POTASSIUM-HCTZ 50-12.5 MG PO TABS: 1.0000 | ORAL_TABLET | Freq: Every day | ORAL | 0 refills | Status: DC

## 2022-10-27 ENCOUNTER — Encounter: Payer: BC Managed Care – PPO | Admitting: Physician Assistant

## 2022-11-09 ENCOUNTER — Encounter: Payer: Self-pay | Admitting: Physician Assistant

## 2022-11-09 ENCOUNTER — Ambulatory Visit (INDEPENDENT_AMBULATORY_CARE_PROVIDER_SITE_OTHER): Payer: BC Managed Care – PPO | Admitting: Physician Assistant

## 2022-11-09 VITALS — BP 138/86 | HR 68 | Ht 72.0 in | Wt 253.0 lb

## 2022-11-09 DIAGNOSIS — F17200 Nicotine dependence, unspecified, uncomplicated: Secondary | ICD-10-CM

## 2022-11-09 DIAGNOSIS — F909 Attention-deficit hyperactivity disorder, unspecified type: Secondary | ICD-10-CM

## 2022-11-09 DIAGNOSIS — I10 Essential (primary) hypertension: Secondary | ICD-10-CM | POA: Diagnosis not present

## 2022-11-09 DIAGNOSIS — G4700 Insomnia, unspecified: Secondary | ICD-10-CM | POA: Diagnosis not present

## 2022-11-09 DIAGNOSIS — K130 Diseases of lips: Secondary | ICD-10-CM

## 2022-11-09 DIAGNOSIS — F331 Major depressive disorder, recurrent, moderate: Secondary | ICD-10-CM

## 2022-11-09 DIAGNOSIS — Z0000 Encounter for general adult medical examination without abnormal findings: Secondary | ICD-10-CM | POA: Diagnosis not present

## 2022-11-09 DIAGNOSIS — G4733 Obstructive sleep apnea (adult) (pediatric): Secondary | ICD-10-CM

## 2022-11-09 DIAGNOSIS — Z789 Other specified health status: Secondary | ICD-10-CM

## 2022-11-09 DIAGNOSIS — E6609 Other obesity due to excess calories: Secondary | ICD-10-CM

## 2022-11-09 DIAGNOSIS — Z6834 Body mass index (BMI) 34.0-34.9, adult: Secondary | ICD-10-CM

## 2022-11-09 DIAGNOSIS — F4010 Social phobia, unspecified: Secondary | ICD-10-CM

## 2022-11-09 MED ORDER — AMPHETAMINE-DEXTROAMPHETAMINE 20 MG PO TABS: 20.0000 mg | ORAL_TABLET | Freq: Two times a day (BID) | ORAL | 0 refills | Status: DC

## 2022-11-09 MED ORDER — CLONAZEPAM 0.5 MG PO TABS: 0.5000 mg | ORAL_TABLET | Freq: Two times a day (BID) | ORAL | 1 refills | Status: DC | PRN

## 2022-11-09 MED ORDER — TRAZODONE HCL 50 MG PO TABS: 25.0000 mg | ORAL_TABLET | Freq: Every evening | ORAL | 1 refills | Status: DC | PRN

## 2022-11-09 MED ORDER — LOSARTAN POTASSIUM-HCTZ 50-12.5 MG PO TABS: 1.0000 | ORAL_TABLET | Freq: Every day | ORAL | 0 refills | Status: DC

## 2022-11-09 MED ORDER — KETOCONAZOLE 2 % EX CREA: 1.0000 | TOPICAL_CREAM | Freq: Two times a day (BID) | CUTANEOUS | 0 refills | Status: DC

## 2022-11-09 NOTE — Progress Notes (Unsigned)
Complete physical exam  Patient: Luke Wilkerson   DOB: 1986/07/24   37 y.o. Male  MRN: [id number]  Subjective:    Chief Complaint  Patient presents with   Annual Exam    Luke Wilkerson is a 37 y.o. male who presents today for a complete physical exam. He reports consuming a {diet types:17450} diet. {types:19826} He generally feels {DESC; WELL/FAIRLY WELL/POORLY:18703}. He reports sleeping {DESC; WELL/FAIRLY WELL/POORLY:18703}. He {does/does not:200015} have additional problems to discuss today.   Staying active.  Still drinking 1/2 pack a day.    Most recent fall risk assessment:    11/09/2022    2:55 PM  [place] in the past year? 1  Number falls in past yr: 1  Injury with Fall? 1  Risk for fall due to : No Fall Risks;History of fall(s)  Follow up Falls evaluation completed     Most recent depression screenings:    11/09/2022    3:11 PM 11/09/2022    2:55 PM  PHQ 2/9 Scores  PHQ - 2 Score 0 0  PHQ- 9 Score 0     {VISON DENTAL STD PSA (Optional):27386}  {History (Optional):23778}  Patient Care Team: Lavada Mesi as PCP - General (Family Medicine) Michael Boston, MD as Consulting Physician (General Surgery)   Outpatient Medications Prior to Visit  Medication Sig   amphetamine-dextroamphetamine (ADDERALL) 20 MG tablet Take 1 tablet (20 mg total) by mouth 2 (two) times daily.   amphetamine-dextroamphetamine (ADDERALL) 20 MG tablet Take 1 tablet (20 mg total) by mouth 2 (two) times daily.   amphetamine-dextroamphetamine (ADDERALL) 20 MG tablet Take 1 tablet (20 mg total) by mouth 2 (two) times daily.   clonazePAM (KLONOPIN) 0.5 MG tablet Take 1 tablet (0.5 mg total) by mouth 2 (two) times daily as needed for anxiety.   FLUoxetine (PROZAC) 20 MG tablet Take 20 mg by mouth daily.   losartan-hydrochlorothiazide (HYZAAR) 50-12.5 MG tablet Take 1 tablet by mouth daily.   traZODone (DESYREL) 50 MG tablet Take 0.5-1 tablets (25-50 mg total) by mouth at  bedtime as needed for sleep.   [DISCONTINUED] clindamycin (CLINDAGEL) 1 % gel Apply topically 2 (two) times daily.   [DISCONTINUED] mupirocin ointment (BACTROBAN) 2 % Apply to affected area TID for 7 days.   No facility-administered medications prior to visit.    ROS        Objective:     BP (!) 148/89   Pulse 68   Ht 6' (1.829 m)   Wt 253 lb (114.8 kg)   SpO2 98%   BMI 34.31 kg/m  BP Readings from Last 3 Encounters:  11/09/22 (!) 148/89  07/27/22 (!) 142/90  04/15/22 (!) 148/102   Wt Readings from Last 3 Encounters:  11/09/22 253 lb (114.8 kg)  07/27/22 246 lb (111.6 kg)  07/08/22 250 lb (113.4 kg)      Physical Exam      Assessment & Plan:    Routine Health Maintenance and Physical Exam  Immunization History  Administered Date(s) Administered   PFIZER(Purple Top)SARS-COV-2 Vaccination 10/01/2020, 10/23/2020   Tdap 02/17/2008, 03/01/2009    Health Maintenance  Topic Date Due   COVID-19 Vaccine (3 - Pfizer risk series) 11/20/2020   INFLUENZA VACCINE  11/29/2022 (Originally 03/31/2022)   Hepatitis C Screening  07/28/2023 (Originally 02/22/2005)   HIV Screening  Completed   HPV VACCINES  Aged Out   DTaP/Tdap/Td  Discontinued    Discussed health benefits of physical activity, and encouraged him to  engage in regular exercise appropriate for his age and condition.    No follow-ups on file.     Iran Planas, PA-C

## 2022-11-09 NOTE — Patient Instructions (Signed)
Health Maintenance, Male Adopting a healthy lifestyle and getting preventive care are important in promoting health and wellness. Ask your health care provider about: The right schedule for you to have regular tests and exams. Things you can do on your own to prevent diseases and keep yourself healthy. What should I know about diet, weight, and exercise? Eat a healthy diet  Eat a diet that includes plenty of vegetables, fruits, low-fat dairy products, and lean protein. Do not eat a lot of foods that are high in solid fats, added sugars, or sodium. Maintain a healthy weight Body mass index (BMI) is a measurement that can be used to identify possible weight problems. It estimates body fat based on height and weight. Your health care provider can help determine your BMI and help you achieve or maintain a healthy weight. Get regular exercise Get regular exercise. This is one of the most important things you can do for your health. Most adults should: Exercise for at least 150 minutes each week. The exercise should increase your heart rate and make you sweat (moderate-intensity exercise). Do strengthening exercises at least twice a week. This is in addition to the moderate-intensity exercise. Spend less time sitting. Even light physical activity can be beneficial. Watch cholesterol and blood lipids Have your blood tested for lipids and cholesterol at 36 years of age, then have this test every 5 years. You may need to have your cholesterol levels checked more often if: Your lipid or cholesterol levels are high. You are older than 36 years of age. You are at high risk for heart disease. What should I know about cancer screening? Many types of cancers can be detected early and may often be prevented. Depending on your health history and family history, you may need to have cancer screening at various ages. This may include screening for: Colorectal cancer. Prostate cancer. Skin cancer. Lung  cancer. What should I know about heart disease, diabetes, and high blood pressure? Blood pressure and heart disease High blood pressure causes heart disease and increases the risk of stroke. This is more likely to develop in people who have high blood pressure readings or are overweight. Talk with your health care provider about your target blood pressure readings. Have your blood pressure checked: Every 3-5 years if you are 18-39 years of age. Every year if you are 40 years old or older. If you are between the ages of 65 and 75 and are a current or former smoker, ask your health care provider if you should have a one-time screening for abdominal aortic aneurysm (AAA). Diabetes Have regular diabetes screenings. This checks your fasting blood sugar level. Have the screening done: Once every three years after age 45 if you are at a normal weight and have a low risk for diabetes. More often and at a younger age if you are overweight or have a high risk for diabetes. What should I know about preventing infection? Hepatitis B If you have a higher risk for hepatitis B, you should be screened for this virus. Talk with your health care provider to find out if you are at risk for hepatitis B infection. Hepatitis C Blood testing is recommended for: Everyone born from 1945 through 1965. Anyone with known risk factors for hepatitis C. Sexually transmitted infections (STIs) You should be screened each year for STIs, including gonorrhea and chlamydia, if: You are sexually active and are younger than 36 years of age. You are older than 36 years of age and your   health care provider tells you that you are at risk for this type of infection. Your sexual activity has changed since you were last screened, and you are at increased risk for chlamydia or gonorrhea. Ask your health care provider if you are at risk. Ask your health care provider about whether you are at high risk for HIV. Your health care provider  may recommend a prescription medicine to help prevent HIV infection. If you choose to take medicine to prevent HIV, you should first get tested for HIV. You should then be tested every 3 months for as long as you are taking the medicine. Follow these instructions at home: Alcohol use Do not drink alcohol if your health care provider tells you not to drink. If you drink alcohol: Limit how much you have to 0-2 drinks a day. Know how much alcohol is in your drink. In the [place] one drink equals one 12 oz bottle of beer (355 mL), one 5 oz glass of wine (148 mL), or one 1 oz glass of hard liquor (44 mL). Lifestyle Do not use any products that contain nicotine or tobacco. These products include cigarettes, chewing tobacco, and vaping devices, such as e-cigarettes. If you need help quitting, ask your health care provider. Do not use street drugs. Do not share needles. Ask your health care provider for help if you need support or information about quitting drugs. General instructions Schedule regular health, dental, and eye exams. Stay current with your vaccines. Tell your health care provider if: You often feel depressed. You have ever been abused or do not feel safe at home. Summary Adopting a healthy lifestyle and getting preventive care are important in promoting health and wellness. Follow your health care provider's instructions about healthy diet, exercising, and getting tested or screened for diseases. Follow your health care provider's instructions on monitoring your cholesterol and blood pressure. This information is not intended to replace advice given to you by your health care provider. Make sure you discuss any questions you have with your health care provider. Document Revised: 01/06/2021 Document Reviewed: 01/06/2021 Elsevier Patient Education  2023 Elsevier Inc.  

## 2022-11-10 ENCOUNTER — Encounter: Payer: Self-pay | Admitting: Physician Assistant

## 2022-11-10 DIAGNOSIS — K130 Diseases of lips: Secondary | ICD-10-CM | POA: Insufficient documentation

## 2022-11-10 DIAGNOSIS — E6609 Other obesity due to excess calories: Secondary | ICD-10-CM | POA: Insufficient documentation

## 2022-11-10 DIAGNOSIS — F17200 Nicotine dependence, unspecified, uncomplicated: Secondary | ICD-10-CM | POA: Insufficient documentation

## 2022-11-12 ENCOUNTER — Telehealth: Payer: Self-pay | Admitting: Neurology

## 2022-11-12 NOTE — Telephone Encounter (Signed)
-----   Message from Luke Stade, PA-C sent at 11/10/2022  6:21 AM EDT ----- Pt has not been called about CPAP machine. Can we check on this for him.

## 2022-11-12 NOTE — Telephone Encounter (Signed)
Called [facility] where information was sent in November. They state they do not have a record of this patient. I sent information again to different fax number provided [phone]) and they confirmed they will reach out to the patient.

## 2022-11-26 DIAGNOSIS — G4733 Obstructive sleep apnea (adult) (pediatric): Secondary | ICD-10-CM | POA: Diagnosis not present

## 2022-12-11 DIAGNOSIS — F1010 Alcohol abuse, uncomplicated: Secondary | ICD-10-CM | POA: Diagnosis not present

## 2022-12-17 DIAGNOSIS — F1010 Alcohol abuse, uncomplicated: Secondary | ICD-10-CM | POA: Diagnosis not present

## 2022-12-27 DIAGNOSIS — G4733 Obstructive sleep apnea (adult) (pediatric): Secondary | ICD-10-CM | POA: Diagnosis not present

## 2022-12-28 DIAGNOSIS — F1010 Alcohol abuse, uncomplicated: Secondary | ICD-10-CM | POA: Diagnosis not present

## 2023-01-01 ENCOUNTER — Ambulatory Visit (INDEPENDENT_AMBULATORY_CARE_PROVIDER_SITE_OTHER): Payer: BC Managed Care – PPO

## 2023-01-01 ENCOUNTER — Ambulatory Visit
Admission: EM | Admit: 2023-01-01 | Discharge: 2023-01-01 | Disposition: A | Discharge status: Home / Self Care | Payer: BC Managed Care – PPO | Attending: Family Medicine | Admitting: Family Medicine

## 2023-01-01 DIAGNOSIS — M79672 Pain in left foot: Secondary | ICD-10-CM

## 2023-01-01 MED ORDER — IBUPROFEN 800 MG PO TABS: 800.0000 mg | ORAL_TABLET | Freq: Three times a day (TID) | ORAL | 0 refills | Status: DC

## 2023-01-01 NOTE — ED Provider Notes (Signed)
[facility]    CSN: [id number] Arrival date & time: 01/01/23  1138      History   Chief Complaint Chief Complaint  Patient presents with   Foot Pain    HPI Luke Wilkerson is a 37 y.o. male.   HPI 37 year old male presents with left foot pain for 1 week reports aerator fell on his left foot 1 week ago.  Pain worse with ambulation.  PMH significant for obesity, ADHD, and HTN.  Past Medical History:  Diagnosis Date   ADHD    Anxiety    Elevated ALT measurement 05/11/2017   AST:ALT 0.5   Hypertension    Hypertriglyceridemia 04/10/2019   Obesity    Pneumonia 2016    Patient Active Problem List   Diagnosis Date Noted   Angular cheilitis 11/10/2022   Current smoker 11/10/2022   Class 1 obesity due to excess calories without serious comorbidity with body mass index (BMI) of 34.0 to 34.9 in adult 11/10/2022   Cellulitis of nasal tip 10/07/2022   GAD (generalized anxiety disorder) 07/27/2022   Insomnia 07/27/2022   OSA (obstructive sleep apnea) 07/14/2022   Right elbow pain 04/06/2022   Elevated ferritin 03/10/2021   Raised serum iron 03/10/2021   Folate deficiency 03/10/2021   Alcoholic fatty liver 03/06/2021   Tobacco dependence 03/05/2021   Elevated liver enzymes 03/05/2021   Alcohol abuse 03/05/2021   Fracture of right greater tuberosity and inferior glenoid 11/12/2020   Non-restorative sleep 10/20/2019   Frequent headaches 10/20/2019   Class 1 obesity due to excess calories with serious comorbidity and body mass index (BMI) of 32.0 to 32.9 in adult 10/20/2019   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 05/12/2019   Hypertriglyceridemia 04/10/2019   Perianal abscess 06/21/2017   Elevated ALT measurement 05/11/2017   RUQ abdominal pain 05/10/2017   Hypertension goal BP (blood pressure) < 130/80 04/09/2017   Alcohol consumption of more than two drinks per day 03/12/2017   Adult ADHD 03/12/2017   Elevated blood pressure reading 03/12/2017    Moderate episode of recurrent major depressive disorder (HCC) 03/12/2017   Social anxiety disorder 03/12/2017   Snoring 03/12/2017   At risk for obstructive sleep apnea 03/12/2017   Tobacco use 03/12/2017   Back pain 02/08/2014    Past Surgical History:  Procedure Laterality Date   EVALUATION UNDER ANESTHESIA WITH HEMORRHOIDECTOMY N/A 05/12/2019   Procedure: ANORECTAL EXAM UNDER ANESTHESIA WITH LIFT REPAIR;  Surgeon: Karie Soda, MD;  Location: [facility];  Service: General;  Laterality: N/A;   FRACTURE SURGERY Right 2008   Rt knee   FRACTURE SURGERY Right 2016   Rt wrist from old injury   LEG SURGERY     WRIST ARTHROSCOPY         Home Medications    Prior to Admission medications   Medication Sig Start Date End Date Taking? Authorizing Provider  ibuprofen (ADVIL) 800 MG tablet Take 1 tablet (800 mg total) by mouth 3 (three) times daily. 01/01/23  Yes Trevor Iha, FNP  amphetamine-dextroamphetamine (ADDERALL) 20 MG tablet Take 1 tablet (20 mg total) by mouth 2 (two) times daily. 11/09/22   Breeback, Jade L, PA-C  amphetamine-dextroamphetamine (ADDERALL) 20 MG tablet Take 1 tablet (20 mg total) by mouth 2 (two) times daily. 12/10/22   Breeback, Jade L, PA-C  amphetamine-dextroamphetamine (ADDERALL) 20 MG tablet Take 1 tablet (20 mg total) by mouth 2 (two) times daily. 01/09/23   Breeback, Jade L, PA-C  clonazePAM (KLONOPIN) 0.5 MG tablet Take 1  tablet (0.5 mg total) by mouth 2 (two) times daily as needed for anxiety. 11/09/22   Breeback, Jade L, PA-C  FLUoxetine (PROZAC) 20 MG tablet Take 20 mg by mouth daily.    [provider]  ketoconazole (NIZORAL) 2 % cream Apply 1 Application topically 2 (two) times daily. To affected areas for 2 weeks. 11/09/22   Breeback, Lonna Cobb, PA-C  losartan-hydrochlorothiazide (HYZAAR) 50-12.5 MG tablet Take 1 tablet by mouth daily. 11/09/22   Breeback, Lonna Cobb, PA-C  traZODone (DESYREL) 50 MG tablet Take 0.5-1 tablets (25-50 mg total) by mouth at  bedtime as needed for sleep. 11/09/22   Jomarie Longs, PA-C    Family History Family History  Problem Relation Age of Onset   Hypertension Mother    Hyperlipidemia Mother    Diabetes Father     Social History Social History   Tobacco Use   Smoking status: Every Day    Packs/day: 1.00    Years: 15.00    Additional pack years: 0.00    Total pack years: 15.00    Types: Cigarettes   Smokeless tobacco: Never  Vaping Use   Vaping Use: Never used  Substance Use Topics   Alcohol use: Yes    Alcohol/week: 7.0 standard drinks of alcohol    Types: 7 Cans of beer per week    Comment: sometimes 6 in a day   Drug use: No     Allergies   Norvasc [amlodipine]   Review of Systems Review of Systems  Musculoskeletal:        Left foot pain x 1 week  All other systems reviewed and are negative.    Physical Exam Triage Vital Signs ED Triage Vitals  Enc Vitals Group     BP 01/01/23 1234 (!) 156/100     Pulse Rate 01/01/23 1234 86     Resp 01/01/23 1234 18     Temp 01/01/23 1234 97.8 F (36.6 C)     Temp src --      SpO2 01/01/23 1234 98 %     Weight --      Height --      Head Circumference --      Peak Flow --      Pain Score 01/01/23 1233 10     Pain Loc --      Pain Edu? --      Excl. in GC? --    No data found.  Updated Vital Signs BP (!) 156/100 (BP Location: Left Arm)   Pulse 86   Temp 97.8 F (36.6 C)   Resp 18   SpO2 98%   Physical Exam Vitals and nursing note reviewed.  Constitutional:      Appearance: Normal appearance. He is obese.  HENT:     Head: Normocephalic and atraumatic.     Nose: Nose normal.     Mouth/Throat:     Mouth: Mucous membranes are dry.     Pharynx: Oropharynx is clear.  Eyes:     Extraocular Movements: Extraocular movements intact.     Conjunctiva/sclera: Conjunctivae normal.     Pupils: Pupils are equal, round, and reactive to light.  Cardiovascular:     Rate and Rhythm: Normal rate and regular rhythm.     Pulses:  Normal pulses.     Heart sounds: Normal heart sounds.     Comments: Hypertensive Pulmonary:     Effort: Pulmonary effort is normal.     Breath sounds: No wheezing, rhonchi or  rales.  Musculoskeletal:        General: Normal range of motion.     Cervical back: Normal range of motion and neck supple.     Comments: Left foot (dorsum over first MTP) : Mildly TTP with moderate soft tissue swelling noted  Skin:    General: Skin is warm and dry.  Neurological:     General: No focal deficit present.     Mental Status: He is alert and oriented to person, place, and time. Mental status is at baseline.     Gait: Gait abnormal.  Psychiatric:        Mood and Affect: Mood normal.        Behavior: Behavior normal.        Thought Content: Thought content normal.      UC Treatments / Results  Labs (all labs ordered are listed, but only abnormal results are displayed) Labs Reviewed - No data to display  EKG   Radiology DG Foot Complete Left  Result Date: 01/01/2023 CLINICAL DATA:  Injury 1 week ago.  Persistent pain. EXAM: LEFT FOOT - COMPLETE 3+ VIEW COMPARISON:  None Available. FINDINGS: There is no evidence of fracture or dislocation. There is no evidence of arthropathy or other focal bone abnormality. Soft tissues are unremarkable. IMPRESSION: Negative. Electronically Signed   By: Amie Portland M.D.   On: 01/01/2023 12:59    Procedures Procedures (including critical care time)  Medications Ordered in UC Medications - No data to display  Initial Impression / Assessment and Plan / UC Course  I have reviewed the triage vital signs and the nursing notes.  Pertinent labs & imaging results that were available during my care of the patient were reviewed by me and considered in my medical decision making (see chart for details).     MDM: 1. Foot pain, left-left foot x-ray results revealed above, Rx'd ibuprofen 800 mg 3 times daily, as needed, advised RICE left foot. Advised patient of left  foot x-ray results with hardcopy provided to patient.  Advised may take Ibuprofen daily or as needed for left foot pain.  Advised may RICE affected area of left foot for 30 minutes 3 times daily for the next 3 days.  Encouraged increase daily water intake to 64 ounces per day while taking this medication.  Advised if symptoms worsen and/or unresolved please follow-up with your PCP or [facility] orthopedic providers for further evaluation.  Charged home, hemodynamically stable. Final Clinical Impressions(s) / UC Diagnoses   Final diagnoses:  Foot pain, left     Discharge Instructions      Advised patient of left foot x-ray results with hardcopy provided to patient.  Advised may take Ibuprofen daily or as needed for left foot pain.  Advised may RICE affected area of left foot for 30 minutes 3 times daily for the next 3 days.  Encouraged increase daily water intake to 64 ounces per day while taking this medication.  Advised if symptoms worsen and/or unresolved please follow-up with your PCP or [facility] orthopedic providers for further evaluation.     ED Prescriptions     Medication Sig Dispense Auth. Provider   ibuprofen (ADVIL) 800 MG tablet Take 1 tablet (800 mg total) by mouth 3 (three) times daily. 21 tablet Trevor Iha, FNP      PDMP not reviewed this encounter.   Trevor Iha, FNP 01/01/23 1332

## 2023-01-01 NOTE — ED Triage Notes (Signed)
Patient presents to [facility] for left foot pain since 1 week. States aerator fell on his foot. Pain worse in the morning and throughout the day with ambulation. Taking OTC pain reliever.

## 2023-01-01 NOTE — Discharge Instructions (Addendum)
Advised patient of left foot x-ray results with hardcopy provided to patient.  Advised may take Ibuprofen daily or as needed for left foot pain.  Advised may RICE affected area of left foot for 30 minutes 3 times daily for the next 3 days.  Encouraged increase daily water intake to 64 ounces per day while taking this medication.  Advised if symptoms worsen and/or unresolved please follow-up with your PCP or [facility] orthopedic providers for further evaluation.

## 2023-01-04 DIAGNOSIS — F1010 Alcohol abuse, uncomplicated: Secondary | ICD-10-CM | POA: Diagnosis not present

## 2023-01-11 DIAGNOSIS — F1010 Alcohol abuse, uncomplicated: Secondary | ICD-10-CM | POA: Diagnosis not present

## 2023-01-18 DIAGNOSIS — F1010 Alcohol abuse, uncomplicated: Secondary | ICD-10-CM | POA: Diagnosis not present

## 2023-01-26 DIAGNOSIS — G4733 Obstructive sleep apnea (adult) (pediatric): Secondary | ICD-10-CM | POA: Diagnosis not present

## 2023-02-01 DIAGNOSIS — F1010 Alcohol abuse, uncomplicated: Secondary | ICD-10-CM | POA: Diagnosis not present

## 2023-02-09 ENCOUNTER — Ambulatory Visit: Payer: BC Managed Care – PPO | Admitting: Physician Assistant

## 2023-02-15 DIAGNOSIS — F1010 Alcohol abuse, uncomplicated: Secondary | ICD-10-CM | POA: Diagnosis not present

## 2023-02-17 ENCOUNTER — Ambulatory Visit: Payer: BC Managed Care – PPO | Admitting: Physician Assistant

## 2023-02-22 DIAGNOSIS — F1010 Alcohol abuse, uncomplicated: Secondary | ICD-10-CM | POA: Diagnosis not present

## 2023-02-26 DIAGNOSIS — G4733 Obstructive sleep apnea (adult) (pediatric): Secondary | ICD-10-CM | POA: Diagnosis not present

## 2023-03-15 DIAGNOSIS — F1010 Alcohol abuse, uncomplicated: Secondary | ICD-10-CM | POA: Diagnosis not present

## 2023-03-28 DIAGNOSIS — G4733 Obstructive sleep apnea (adult) (pediatric): Secondary | ICD-10-CM | POA: Diagnosis not present

## 2023-06-28 ENCOUNTER — Ambulatory Visit: Payer: BC Managed Care – PPO | Admitting: Physician Assistant

## 2023-06-28 ENCOUNTER — Encounter: Payer: Self-pay | Admitting: Physician Assistant

## 2023-06-28 VITALS — BP 149/79 | HR 105 | Ht 71.0 in | Wt 237.5 lb

## 2023-06-28 DIAGNOSIS — M25561 Pain in right knee: Secondary | ICD-10-CM | POA: Diagnosis not present

## 2023-06-28 DIAGNOSIS — K130 Diseases of lips: Secondary | ICD-10-CM

## 2023-06-28 DIAGNOSIS — M7989 Other specified soft tissue disorders: Secondary | ICD-10-CM

## 2023-06-28 DIAGNOSIS — F909 Attention-deficit hyperactivity disorder, unspecified type: Secondary | ICD-10-CM

## 2023-06-28 DIAGNOSIS — F331 Major depressive disorder, recurrent, moderate: Secondary | ICD-10-CM

## 2023-06-28 DIAGNOSIS — G4700 Insomnia, unspecified: Secondary | ICD-10-CM

## 2023-06-28 DIAGNOSIS — I10 Essential (primary) hypertension: Secondary | ICD-10-CM | POA: Diagnosis not present

## 2023-06-28 DIAGNOSIS — M79671 Pain in right foot: Secondary | ICD-10-CM

## 2023-06-28 DIAGNOSIS — E6609 Other obesity due to excess calories: Secondary | ICD-10-CM

## 2023-06-28 DIAGNOSIS — E66812 Obesity, class 2: Secondary | ICD-10-CM

## 2023-06-28 DIAGNOSIS — F4010 Social phobia, unspecified: Secondary | ICD-10-CM | POA: Diagnosis not present

## 2023-06-28 DIAGNOSIS — E781 Pure hyperglyceridemia: Secondary | ICD-10-CM

## 2023-06-28 DIAGNOSIS — M79672 Pain in left foot: Secondary | ICD-10-CM

## 2023-06-28 MED ORDER — OZEMPIC (0.25 OR 0.5 MG/DOSE) 2 MG/3ML ~~LOC~~ SOPN: 0.2500 mg | PEN_INJECTOR | SUBCUTANEOUS | 0 refills | Status: DC

## 2023-06-28 MED ORDER — AMPHETAMINE-DEXTROAMPHETAMINE 20 MG PO TABS: 20.0000 mg | ORAL_TABLET | Freq: Two times a day (BID) | ORAL | 0 refills | Status: DC

## 2023-06-28 MED ORDER — TRAZODONE HCL 50 MG PO TABS: ORAL_TABLET | ORAL | 1 refills | Status: DC

## 2023-06-28 MED ORDER — CLONAZEPAM 0.5 MG PO TABS: 0.5000 mg | ORAL_TABLET | Freq: Two times a day (BID) | ORAL | 1 refills | Status: DC | PRN

## 2023-06-28 MED ORDER — LOSARTAN POTASSIUM-HCTZ 50-12.5 MG PO TABS: 1.0000 | ORAL_TABLET | Freq: Every day | ORAL | 0 refills | Status: DC

## 2023-06-28 MED ORDER — PREDNISONE 50 MG PO TABS: ORAL_TABLET | ORAL | 0 refills | Status: DC

## 2023-06-28 MED ORDER — HYDROCODONE-ACETAMINOPHEN 5-325 MG PO TABS: 1.0000 | ORAL_TABLET | Freq: Four times a day (QID) | ORAL | 0 refills | Status: AC | PRN

## 2023-06-28 MED ORDER — IBUPROFEN 800 MG PO TABS: 800.0000 mg | ORAL_TABLET | Freq: Three times a day (TID) | ORAL | 0 refills | Status: DC

## 2023-06-28 NOTE — Progress Notes (Signed)
Acute Office Visit  Subjective:     Patient ID: Luke Wilkerson, male    DOB: 07/10/1986, 37 y.o.   MRN: [id number]  Chief Complaint  Patient presents with   bilateral feet pain   Right knee pain/ swelling    ADHD    HPI Patient is in today for R knee pain and intermittent L and R big toe pain. He states the R knee pain has happened once before in June. He states eventually the pain went away after resting and icing but this time icing has had minimal relief. R knee pain started on Friday and has been progressively getting worse. He has tried ibuprofen and lidocaine cream for both the big toe and knee pain with minimal relief. It has started to interfere with his sleep. He has a history of a R tibial fracture that resulted in a metal plate insertion about 15 years ago.   The L and R big toe pain has occurred on and off for some time. At this moment his R toe is in more pain than his L. He believes they are bunions but isn't sure. Very painful to walk.   He has not had any alcohol to drink for the last week.   He is also interested in weight loss and would like to discuss starting Ozempic.   He has not been taking his medications. He states he doesn't think they really help but is requesting refills on everything.  .. Active Ambulatory Problems    Diagnosis Date Noted   Back pain 02/08/2014   Alcohol consumption of more than two drinks per day 03/12/2017   Adult ADHD 03/12/2017   Elevated blood pressure reading 03/12/2017   Moderate episode of recurrent major depressive disorder (HCC) 03/12/2017   Social anxiety disorder 03/12/2017   Snoring 03/12/2017   At risk for obstructive sleep apnea 03/12/2017   Tobacco use 03/12/2017   Hypertension goal BP (blood pressure) < 130/80 04/09/2017   RUQ abdominal pain 05/10/2017   Elevated ALT measurement 05/11/2017   Perianal abscess 06/21/2017   Hypertriglyceridemia 04/10/2019   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  05/12/2019   Non-restorative sleep 10/20/2019   Frequent headaches 10/20/2019   Class 1 obesity due to excess calories with serious comorbidity and body mass index (BMI) of 32.0 to 32.9 in adult 10/20/2019   Fracture of right greater tuberosity and inferior glenoid 11/12/2020   Tobacco dependence 03/05/2021   Elevated liver enzymes 03/05/2021   Alcohol abuse 03/05/2021   Alcoholic fatty liver 03/06/2021   Elevated ferritin 03/10/2021   Raised serum iron 03/10/2021   Folate deficiency 03/10/2021   Right elbow pain 04/06/2022   OSA (obstructive sleep apnea) 07/14/2022   GAD (generalized anxiety disorder) 07/27/2022   Insomnia 07/27/2022   Cellulitis of nasal tip 10/07/2022   Angular cheilitis 11/10/2022   Current smoker 11/10/2022   Class 2 obesity due to excess calories without serious comorbidity with body mass index (BMI) of 35.0 to 35.9 in adult 11/10/2022   Acute pain of right knee 06/29/2023   Bilateral swelling of feet 06/29/2023   Bilateral foot pain 06/29/2023   Resolved Ambulatory Problems    Diagnosis Date Noted   NAFL (nonalcoholic fatty liver) 05/25/2017   Past Medical History:  Diagnosis Date   ADHD    Anxiety    Hypertension    Obesity    Pneumonia 2016     ROS See HPI     Objective:    BP Marland Kitchen)  149/79   Pulse (!) 105   Ht 5\' 11"  (1.803 m)   Wt 237 lb 8 oz (107.7 kg)   SpO2 98%   BMI 33.12 kg/m  BP Readings from Last 3 Encounters:  06/28/23 (!) 149/79  01/01/23 (!) 156/100  11/09/22 138/86   Wt Readings from Last 3 Encounters:  06/28/23 237 lb 8 oz (107.7 kg)  11/09/22 253 lb (114.8 kg)  07/27/22 246 lb (111.6 kg)    ..    06/28/2023    5:16 PM 11/09/2022    3:11 PM 11/09/2022    2:55 PM 07/27/2022    9:16 AM 04/15/2022    3:32 PM  Depression screen PHQ 2/9  Decreased Interest 0 0 0 0 0  Down, Depressed, Hopeless 0 0 0 2 0  PHQ - 2 Score 0 0 0 2 0  Altered sleeping 3 0  0 3  Tired, decreased energy 1 0  0 0  Change in appetite 1 0  0  0  Feeling bad or failure about yourself  0 0  0 0  Trouble concentrating 1 0  3 2  Moving slowly or fidgety/restless 0 0  0 0  Suicidal thoughts 0 0  0 0  PHQ-9 Score 6 0  5 5  Difficult doing work/chores Extremely dIfficult Not difficult at all  Somewhat difficult Very difficult   ..    06/28/2023    5:16 PM 11/09/2022    3:11 PM 07/27/2022    9:17 AM 04/15/2022    3:33 PM  GAD 7 : Generalized Anxiety Score  Nervous, Anxious, on Edge 0 0 3 3  Control/stop worrying 1 0 3 3  Worry too much - different things 1 0 3 3  Trouble relaxing 1 0 3 3  Restless 1 0 3 0  Easily annoyed or irritable 3 0 3 3  Afraid - awful might happen 0 0 3 1  Total GAD 7 Score 7 0 21 16  Anxiety Difficulty Extremely difficult Not difficult at all Somewhat difficult Extremely difficult      Physical Exam Constitutional:      Appearance: He is obese.  HENT:     Head: Normocephalic and atraumatic.  Cardiovascular:     Rate and Rhythm: Normal rate.  Pulmonary:     Effort: Pulmonary effort is normal.  Musculoskeletal:     Right knee: Swelling present. Tenderness present.     Right foot: Tenderness present. No swelling or crepitus.     Left foot: Tenderness present. No swelling or crepitus.     Comments: Strength 5/5 in R leg. Scar present on R knee/shin  Skin:    General: Skin is warm and dry.  Neurological:     General: No focal deficit present.     Mental Status: He is alert and oriented to person, place, and time.  Psychiatric:        Mood and Affect: Mood normal.        Behavior: Behavior normal.        Assessment & Plan:  .Ardian was seen today for bilateral feet pain, right knee pain/ swelling  and adhd.  Diagnoses and all orders for this visit:  Acute pain of right knee -     ibuprofen (ADVIL) 800 MG tablet; Take 1 tablet (800 mg total) by mouth 3 (three) times daily. -     DG Knee Complete 4 Views Right; Future -     predniSONE (DELTASONE) 50 MG tablet; Take  one tablet for 5  days. -     HYDROcodone-acetaminophen (NORCO/VICODIN) 5-325 MG tablet; Take 1 tablet by mouth every 6 (six) hours as needed for up to 5 days for moderate pain (pain score 4-6). -     Uric acid -     CBC w/Diff/Platelet -     CMP14+EGFR  Adult ADHD -     amphetamine-dextroamphetamine (ADDERALL) 20 MG tablet; Take 1 tablet (20 mg total) by mouth 2 (two) times daily.  Social anxiety disorder -     clonazePAM (KLONOPIN) 0.5 MG tablet; Take 1 tablet (0.5 mg total) by mouth 2 (two) times daily as needed for anxiety.  Moderate episode of recurrent major depressive disorder (HCC)  Hypertension goal BP (blood pressure) < 130/80 -     losartan-hydrochlorothiazide (HYZAAR) 50-12.5 MG tablet; Take 1 tablet by mouth daily. -     Semaglutide,0.25 or 0.5MG /DOS, (OZEMPIC, 0.25 OR 0.5 MG/DOSE,) 2 MG/3ML SOPN; Inject 0.25 mg into the skin once a week.  Insomnia, unspecified type -     traZODone (DESYREL) 50 MG tablet; Take 2-3 tablets at bedtime for sleep.  Class 2 obesity due to excess calories without serious comorbidity with body mass index (BMI) of 35.0 to 35.9 in adult -     Semaglutide,0.25 or 0.5MG /DOS, (OZEMPIC, 0.25 OR 0.5 MG/DOSE,) 2 MG/3ML SOPN; Inject 0.25 mg into the skin once a week.  Bilateral foot pain -     ibuprofen (ADVIL) 800 MG tablet; Take 1 tablet (800 mg total) by mouth 3 (three) times daily. -     predniSONE (DELTASONE) 50 MG tablet; Take one tablet for 5 days. -     HYDROcodone-acetaminophen (NORCO/VICODIN) 5-325 MG tablet; Take 1 tablet by mouth every 6 (six) hours as needed for up to 5 days for moderate pain (pain score 4-6).  Bilateral swelling of feet -     ibuprofen (ADVIL) 800 MG tablet; Take 1 tablet (800 mg total) by mouth 3 (three) times daily. -     predniSONE (DELTASONE) 50 MG tablet; Take one tablet for 5 days. -     HYDROcodone-acetaminophen (NORCO/VICODIN) 5-325 MG tablet; Take 1 tablet by mouth every 6 (six) hours as needed for up to 5 days for moderate pain  (pain score 4-6).  Hypertriglyceridemia -     Semaglutide,0.25 or 0.5MG /DOS, (OZEMPIC, 0.25 OR 0.5 MG/DOSE,) 2 MG/3ML SOPN; Inject 0.25 mg into the skin once a week.    Discussed medications and the need to take the regularly for an extended period of time to determine their efficacy.   X ray of R knee ordered. Pain meds and prednisone given to hopefully help him sleep without pain and decrease inflammation. Uric acid levels ordered for today for likely gout in R and L toe. Refills on all medications given. Encouraged him to start taking his medications consistently to see if they make a difference.  Patient states he has stopped drinking alcohol since Friday.Encouraged to continue that.  Prescription ordered for Ozempic for weight loss. Will see if covered by insurance.  BP elevated. Encouraged to take hyzaar consistently to try to reduce BP.   Return in about 4 weeks (around 07/26/2023) for CPE/BP recheck.  Tandy Gaw, PA-C

## 2023-06-28 NOTE — Patient Instructions (Signed)
Will try to get ozempic approved Get labs Get xray

## 2023-06-29 ENCOUNTER — Encounter: Payer: Self-pay | Admitting: Physician Assistant

## 2023-06-29 DIAGNOSIS — G8929 Other chronic pain: Secondary | ICD-10-CM | POA: Insufficient documentation

## 2023-06-29 DIAGNOSIS — M7989 Other specified soft tissue disorders: Secondary | ICD-10-CM | POA: Insufficient documentation

## 2023-06-29 DIAGNOSIS — M79671 Pain in right foot: Secondary | ICD-10-CM | POA: Insufficient documentation

## 2023-06-29 DIAGNOSIS — M25561 Pain in right knee: Secondary | ICD-10-CM | POA: Insufficient documentation

## 2023-06-29 LAB — CMP14+EGFR
ALT: 35 [IU]/L (ref 0–44)
AST: 24 [IU]/L (ref 0–40)
Albumin: 5.1 g/dL (ref 4.1–5.1)
Alkaline Phosphatase: 81 [IU]/L (ref 44–121)
BUN/Creatinine Ratio: 9 (ref 9–20)
BUN: 12 mg/dL (ref 6–20)
Bilirubin Total: 0.4 mg/dL (ref 0.0–1.2)
CO2: 19 mmol/L — ABNORMAL LOW (ref 20–29)
Calcium: 9.9 mg/dL (ref 8.7–10.2)
Chloride: 98 mmol/L (ref 96–106)
Creatinine, Ser: 1.3 mg/dL — ABNORMAL HIGH (ref 0.76–1.27)
Globulin, Total: 2.5 g/dL (ref 1.5–4.5)
Glucose: 91 mg/dL (ref 70–99)
Potassium: 4.5 mmol/L (ref 3.5–5.2)
Sodium: 140 mmol/L (ref 134–144)
Total Protein: 7.6 g/dL (ref 6.0–8.5)
eGFR: 73 mL/min/{1.73_m2} (ref >59)

## 2023-06-29 LAB — CBC WITH DIFFERENTIAL/PLATELET
Basophils Absolute: 0.1 10*3/uL (ref 0.0–0.2)
Basos: 0 %
EOS (ABSOLUTE): 0.1 10*3/uL (ref 0.0–0.4)
Eos: 1 %
Hematocrit: 46.4 % (ref 37.5–51.0)
Hemoglobin: 16 g/dL (ref 13.0–17.7)
Immature Grans (Abs): 0 10*3/uL (ref 0.0–0.1)
Immature Granulocytes: 0 %
Lymphocytes Absolute: 3 10*3/uL (ref 0.7–3.1)
Lymphs: 25 %
MCH: 34 pg — ABNORMAL HIGH (ref 26.6–33.0)
MCHC: 34.5 g/dL (ref 31.5–35.7)
MCV: 99 fL — ABNORMAL HIGH (ref 79–97)
Monocytes Absolute: 1.3 10*3/uL — ABNORMAL HIGH (ref 0.1–0.9)
Monocytes: 10 %
Neutrophils Absolute: 7.5 10*3/uL — ABNORMAL HIGH (ref 1.4–7.0)
Neutrophils: 64 %
Platelets: 319 10*3/uL (ref 150–450)
RBC: 4.7 x10E6/uL (ref 4.14–5.80)
RDW: 10.8 % — ABNORMAL LOW (ref 11.6–15.4)
WBC: 12 10*3/uL — ABNORMAL HIGH (ref 3.4–10.8)

## 2023-06-29 LAB — URIC ACID: Uric Acid: 7 mg/dL (ref 3.8–8.4)

## 2023-06-29 NOTE — Progress Notes (Signed)
Luke Wilkerson,   Liver enzymes are [facility] better. Good news.  Uric acid normal range but upper limits of normal.  Slightly kidney function decline.  White count a little elevated could be viral or bacterial infection/inflammation.   Let me know if not getting better with prednisone.

## 2023-07-02 ENCOUNTER — Other Ambulatory Visit: Payer: Self-pay | Admitting: Physician Assistant

## 2023-07-02 ENCOUNTER — Telehealth: Payer: Self-pay | Admitting: Physician Assistant

## 2023-07-02 DIAGNOSIS — M79671 Pain in right foot: Secondary | ICD-10-CM

## 2023-07-02 DIAGNOSIS — M25561 Pain in right knee: Secondary | ICD-10-CM

## 2023-07-02 DIAGNOSIS — M7989 Other specified soft tissue disorders: Secondary | ICD-10-CM

## 2023-07-02 NOTE — Telephone Encounter (Signed)
Left message advising of new medication.

## 2023-07-02 NOTE — Telephone Encounter (Signed)
It is not safe to give high doses of prednisone back to back. I will start you on celebrex 200mg  twice a day. Did you get xrays done on ankle?

## 2023-07-09 ENCOUNTER — Telehealth: Payer: Self-pay | Admitting: Physician Assistant

## 2023-07-09 NOTE — Telephone Encounter (Signed)
Patient called to follow up on PA for [facility] 0.25mg  please advise

## 2023-07-15 NOTE — Telephone Encounter (Signed)
Patient called again to follow up on PA OZEMPIC 0.25mg   please Petra Kuba

## 2023-07-22 ENCOUNTER — Telehealth: Payer: Self-pay

## 2023-07-22 NOTE — Telephone Encounter (Addendum)
Initiated Prior authorization NWG:NFAOZHY (0.25 or 0.5 MG/DOSE) 2MG /3ML pen-injectors Via: Covermymeds Case/Key:[id number] Status: Denied  as of 07/22/23 Reason:lack of medical criteria of td2 Notified Pt via: Mychart

## 2023-08-04 ENCOUNTER — Ambulatory Visit (INDEPENDENT_AMBULATORY_CARE_PROVIDER_SITE_OTHER): Payer: BC Managed Care – PPO | Admitting: Physician Assistant

## 2023-08-04 ENCOUNTER — Encounter: Payer: Self-pay | Admitting: Physician Assistant

## 2023-08-04 ENCOUNTER — Ambulatory Visit: Payer: BC Managed Care – PPO

## 2023-08-04 VITALS — BP 146/74 | HR 61 | Ht 72.0 in | Wt 247.0 lb

## 2023-08-04 DIAGNOSIS — R7301 Impaired fasting glucose: Secondary | ICD-10-CM | POA: Insufficient documentation

## 2023-08-04 DIAGNOSIS — I10 Essential (primary) hypertension: Secondary | ICD-10-CM

## 2023-08-04 DIAGNOSIS — Z0000 Encounter for general adult medical examination without abnormal findings: Secondary | ICD-10-CM

## 2023-08-04 DIAGNOSIS — F909 Attention-deficit hyperactivity disorder, unspecified type: Secondary | ICD-10-CM | POA: Diagnosis not present

## 2023-08-04 DIAGNOSIS — M25561 Pain in right knee: Secondary | ICD-10-CM

## 2023-08-04 DIAGNOSIS — E66811 Obesity, class 1: Secondary | ICD-10-CM | POA: Diagnosis not present

## 2023-08-04 DIAGNOSIS — G8929 Other chronic pain: Secondary | ICD-10-CM

## 2023-08-04 LAB — POCT GLYCOSYLATED HEMOGLOBIN (HGB A1C): Hemoglobin A1C: 5 % (ref 4.0–5.6)

## 2023-08-04 MED ORDER — LOSARTAN POTASSIUM-HCTZ 100-12.5 MG PO TABS: 1.0000 | ORAL_TABLET | Freq: Every day | ORAL | 0 refills | Status: DC

## 2023-08-04 MED ORDER — AMPHETAMINE-DEXTROAMPHETAMINE 20 MG PO TABS
20.0000 mg | ORAL_TABLET | Freq: Two times a day (BID) | ORAL | 0 refills | Status: DC
Start: 2023-09-03 — End: 2024-03-31

## 2023-08-04 MED ORDER — WEGOVY 0.25 MG/0.5ML ~~LOC~~ SOAJ: 0.2500 mg | SUBCUTANEOUS | 0 refills | Status: DC

## 2023-08-04 MED ORDER — AMPHETAMINE-DEXTROAMPHETAMINE 20 MG PO TABS: 20.0000 mg | ORAL_TABLET | Freq: Two times a day (BID) | ORAL | 0 refills | Status: DC

## 2023-08-04 NOTE — Progress Notes (Signed)
Complete physical exam  Patient: Luke Wilkerson   DOB: Jul 20, 1986   37 y.o. Male  MRN: [id number]  Subjective:    Chief Complaint  Patient presents with   Knee Pain    Rt knee pain     Luke Wilkerson is a 37 y.o. male who presents today for a complete physical exam. He reports consuming a general diet. Exercise is limited by orthopedic condition(s): right knee pain. He generally feels fairly well. He reports sleeping fairly well. He does have additional problems to discuss today.   Patient also reports right knee pain off/on since April 2024. We saw him for this 4 wks ago and ordered an Xray which he has not gotten. PMH fracture in R knee in 2008 which he got internal fixation for. Gets swollen and he cannot bend it. Has never gotten knee injections. Has not seen specialist since 2008. Celebrex helps a little.  Patient cannot take Ozempic because insurance denied coverage because he is not diabetic.  BP elevated today. Pt does not take at home BP.  Patient drinks 3-4 liquor drinks every other day.  Patient also wants refills on Adderall.   Most recent fall risk assessment:    06/28/2023    5:17 PM  Fall Risk   Falls in the past year? 1  Number falls in past yr: 0  Injury with Fall? 1  Risk for fall due to : History of fall(s)  Follow up Falls evaluation completed     Most recent depression screenings:    06/28/2023    5:16 PM 11/09/2022    3:11 PM  PHQ 2/9 Scores  PHQ - 2 Score 0 0  PHQ- 9 Score 6 0     Patient Care Team: Nolene Ebbs as PCP - General (Family Medicine) Karie Soda, MD as Consulting Physician (General Surgery)   Outpatient Medications Prior to Visit  Medication Sig   celecoxib (CELEBREX) 200 MG capsule Take 1 capsule (200 mg total) by mouth 2 (two) times daily.   clonazePAM (KLONOPIN) 0.5 MG tablet Take 1 tablet (0.5 mg total) by mouth 2 (two) times daily as needed for anxiety.   FLUoxetine (PROZAC) 20 MG tablet Take 20 mg by mouth  daily.   ketoconazole (NIZORAL) 2 % cream Apply 1 Application topically 2 (two) times daily. To affected areas for 2 weeks.   traZODone (DESYREL) 50 MG tablet Take 2-3 tablets at bedtime for sleep.   [DISCONTINUED] amphetamine-dextroamphetamine (ADDERALL) 20 MG tablet Take 1 tablet (20 mg total) by mouth 2 (two) times daily.   [DISCONTINUED] amphetamine-dextroamphetamine (ADDERALL) 20 MG tablet Take 1 tablet (20 mg total) by mouth 2 (two) times daily.   [DISCONTINUED] amphetamine-dextroamphetamine (ADDERALL) 20 MG tablet Take 1 tablet (20 mg total) by mouth 2 (two) times daily.   [DISCONTINUED] ibuprofen (ADVIL) 800 MG tablet Take 1 tablet (800 mg total) by mouth 3 (three) times daily.   [DISCONTINUED] losartan-hydrochlorothiazide (HYZAAR) 50-12.5 MG tablet Take 1 tablet by mouth daily.   [DISCONTINUED] Semaglutide,0.25 or 0.5MG /DOS, (OZEMPIC, 0.25 OR 0.5 MG/DOSE,) 2 MG/3ML SOPN Inject 0.25 mg into the skin once a week. (Patient not taking: Reported on 08/04/2023)   No facility-administered medications prior to visit.    Review of Systems  Respiratory:  Negative for shortness of breath.   Cardiovascular:  Negative for chest pain.      Objective:     BP (!) 146/74 (BP Location: Left Arm)   Pulse 61   Ht 6' (1.829 m)  Wt 247 lb (112 kg)   SpO2 99%   BMI 33.50 kg/m     Physical Exam Constitutional:      Appearance: Normal appearance. He is obese.  HENT:     Head: Normocephalic.  Cardiovascular:     Rate and Rhythm: Normal rate and regular rhythm.     Heart sounds: Normal heart sounds.  Pulmonary:     Effort: Pulmonary effort is normal.     Breath sounds: Normal breath sounds.  Musculoskeletal:     Right lower leg: No edema.     Left lower leg: No edema.  Neurological:     General: No focal deficit present.     Mental Status: He is alert and oriented to person, place, and time.  Psychiatric:        Mood and Affect: Mood normal.      Results for orders placed or  performed in visit on 08/04/23  POCT HgB A1C  Result Value Ref Range   Hemoglobin A1C 5.0 4.0 - 5.6 %   HbA1c POC (<> result, manual entry)     HbA1c, POC (prediabetic range)     HbA1c, POC (controlled diabetic range)          Assessment & Plan:    Routine Health Maintenance and Physical Exam  Immunization History  Administered Date(s) Administered   PFIZER(Purple Top)SARS-COV-2 Vaccination 10/01/2020, 10/23/2020   Tdap 02/17/2008, 03/01/2009    Health Maintenance  Topic Date Due   Hepatitis C Screening  Never done   COVID-19 Vaccine (3 - Pfizer risk series) 11/20/2020   INFLUENZA VACCINE  11/29/2023 (Originally 04/01/2023)   HIV Screening  06/27/2024 (Originally 02/22/2002)   HPV VACCINES  Aged Out   DTaP/Tdap/Td  Discontinued    Discussed health benefits of physical activity, and encouraged him to engage in regular exercise appropriate for his age and condition. Marland KitchenReuel Wilkerson was seen today for knee pain.  Diagnoses and all orders for this visit:  Routine physical examination  Chronic pain of right knee -     DG Knee Complete 4 Views Right; Future -     WEGOVY 0.25 MG/0.5ML SOAJ; Inject 0.25 mg into the skin once a week. Use this dose for 1 month (4 shots) and then increase to next higher dose.  Adult ADHD -     amphetamine-dextroamphetamine (ADDERALL) 20 MG tablet; Take 1 tablet (20 mg total) by mouth 2 (two) times daily. -     amphetamine-dextroamphetamine (ADDERALL) 20 MG tablet; Take 1 tablet (20 mg total) by mouth 2 (two) times daily. -     amphetamine-dextroamphetamine (ADDERALL) 20 MG tablet; Take 1 tablet (20 mg total) by mouth 2 (two) times daily.  Obesity (BMI 30.0-34.9) -     WEGOVY 0.25 MG/0.5ML SOAJ; Inject 0.25 mg into the skin once a week. Use this dose for 1 month (4 shots) and then increase to next higher dose. -     POCT HgB A1C  Elevated fasting glucose  Hypertension goal BP (blood pressure) < 130/80 -     losartan-hydrochlorothiazide (HYZAAR)  100-12.5 MG tablet; Take 1 tablet by mouth daily. -     WEGOVY 0.25 MG/0.5ML SOAJ; Inject 0.25 mg into the skin once a week. Use this dose for 1 month (4 shots) and then increase to next higher dose.     BP not well controlled on losartan-hydrochlorothiazide. Increased losartan hydrochlorothiazide dosage to 100-12.5 mg. Advised patient to take at home BP. BP goal < 130/80. Advised patient to consistently take  BP med everyday. Weight loss can help BP.  Advised patient to get xray for right knee pain.  A1c today was 5.  MWNUUV ordered for weight loss. Improve diet and exercise. Discussed decreasing alcohol intake.  Refilled Adderall.  Patient declined COVID vaccine.   Return in about 3 months (around 11/02/2023).     Tandy Gaw, PA-C

## 2023-08-06 ENCOUNTER — Encounter: Payer: Self-pay | Admitting: Physician Assistant

## 2023-08-16 NOTE — Progress Notes (Signed)
Hardware in place. Some spurring in the medial and patellofemoral area. Suggest visit with sports medicine provider Dr. Karie Schwalbe to discuss more intervention for knee pain.

## 2023-09-03 ENCOUNTER — Telehealth: Payer: Self-pay

## 2023-09-03 NOTE — Telephone Encounter (Signed)
 Copied from CRM [id number]. Topic: Clinical - Medication Question >> Sep 03, 2023  3:05 PM Joesph PARAS wrote: Reason for CRM: [id number]  - Patient calling to inquire about prior authorization status for this medication. Patient requesting call back at [phone]. Patient is upset and states the whoever it falls on to do this is not doing their job.

## 2023-09-23 ENCOUNTER — Telehealth: Payer: Self-pay

## 2023-09-23 NOTE — Telephone Encounter (Signed)
Prior auth for: WEGOVY 0.25 MG Determination: DENIED Auth#: [id number] Reason: Per insurance this a a plan exclusion. Insurance does not cover this medication Patient notified via telephone call

## 2023-09-26 ENCOUNTER — Other Ambulatory Visit: Payer: Self-pay | Admitting: Physician Assistant

## 2023-09-26 DIAGNOSIS — M79671 Pain in right foot: Secondary | ICD-10-CM

## 2023-09-26 DIAGNOSIS — M7989 Other specified soft tissue disorders: Secondary | ICD-10-CM

## 2023-09-26 DIAGNOSIS — M25561 Pain in right knee: Secondary | ICD-10-CM

## 2023-09-30 ENCOUNTER — Ambulatory Visit: Payer: BC Managed Care – PPO | Admitting: Sports Medicine

## 2023-11-03 ENCOUNTER — Ambulatory Visit: Payer: BC Managed Care – PPO | Admitting: Physician Assistant

## 2023-12-06 ENCOUNTER — Ambulatory Visit: Admitting: Family Medicine

## 2023-12-06 ENCOUNTER — Ambulatory Visit (INDEPENDENT_AMBULATORY_CARE_PROVIDER_SITE_OTHER)

## 2023-12-06 ENCOUNTER — Ambulatory Visit: Payer: Self-pay

## 2023-12-06 ENCOUNTER — Other Ambulatory Visit: Payer: Self-pay | Admitting: Physician Assistant

## 2023-12-06 ENCOUNTER — Encounter: Payer: Self-pay | Admitting: Family Medicine

## 2023-12-06 VITALS — BP 121/83 | HR 82 | Temp 97.7°F | Ht 72.0 in | Wt 242.8 lb

## 2023-12-06 DIAGNOSIS — K760 Fatty (change of) liver, not elsewhere classified: Secondary | ICD-10-CM

## 2023-12-06 DIAGNOSIS — R1011 Right upper quadrant pain: Secondary | ICD-10-CM | POA: Diagnosis not present

## 2023-12-06 DIAGNOSIS — G8929 Other chronic pain: Secondary | ICD-10-CM | POA: Diagnosis not present

## 2023-12-06 DIAGNOSIS — R0789 Other chest pain: Secondary | ICD-10-CM

## 2023-12-06 DIAGNOSIS — F909 Attention-deficit hyperactivity disorder, unspecified type: Secondary | ICD-10-CM

## 2023-12-06 MED ORDER — DICLOFENAC SODIUM 75 MG PO TBEC: 75.0000 mg | DELAYED_RELEASE_TABLET | Freq: Two times a day (BID) | ORAL | 0 refills | Status: DC

## 2023-12-06 MED ORDER — TRAMADOL HCL 50 MG PO TABS: 50.0000 mg | ORAL_TABLET | Freq: Four times a day (QID) | ORAL | 0 refills | Status: DC | PRN

## 2023-12-06 NOTE — Telephone Encounter (Signed)
 Patient seen today by Luke Wilkerson Luke Wilkerson with [facility]

## 2023-12-06 NOTE — Patient Instructions (Signed)
 Please follow up with your PCP if symptoms do not improve or as needed.    VISIT SUMMARY:  You came in today because of persistent chest wall pain that you've been experiencing for the past 15 years, which has recently worsened. We discussed your symptoms, including the pain's triggers, location, and how it affects your daily life. 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  YOUR PLAN:  -CHEST WALL PAIN: Your chest wall pain is likely due to nerve irritation under the rib, which can be caused by conditions like slipped rib syndrome, muscle strain, or costochondritis. We will order a chest x-ray to check your lungs and rib structure. You are prescribed diclofenac to take twice daily with food for pain relief, and tramadol to use as needed for additional pain relief. A rib brace is recommended for support and comfort. Please follow up with your primary care physician for further evaluation, and we may refer you to a sports medicine or orthopedic specialist if the pain continues.  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 This condition is not contributing to your current symptoms.  -FATTY LIVER: Fatty liver is a condition where fat builds up in your liver. It typically does not cause pain and is not contributing to your current symptoms.  INSTRUCTIONS:  Please follow up with your primary care physician for further evaluation of your chest wall pain. We will also order a chest x-ray to evaluate your lungs and rib structure. If the pain persists, we may refer you to a sports medicine or orthopedic specialist.

## 2023-12-06 NOTE — Telephone Encounter (Signed)
 Last OV 08/04/2023  Last filled 10/03/2023  No upcoming appointments

## 2023-12-06 NOTE — Progress Notes (Signed)
 Subjective  CC:  Chief Complaint  Patient presents with   Abdominal Pain    Pt stated that he has been having abdominal pain for some years now but this one started 4;30 on Saturday and still consistent.     Same day acute visit; PCP not available. New pt to me. Chart reviewed.   HPI: Luke Wilkerson is a 38 y.o. male who presents to the office today to address the problems listed above in the chief complaint. Discussed the use of AI scribe software for clinical note transcription with the patient, who gave verbal consent to proceed.  History of Present Illness   Luke Wilkerson is a 38 year old male who presents with persistent chest wall pain.  He has been experiencing chest wall pain for the past 15 years, triggered by certain movements like reaching into the back seat of a car. The pain is located on his side and feels like a 'broken rib.' It usually resolves within five minutes but has persisted since 4:30 AM on Saturday, December 04, 2023, after he stretched upon waking. The pain worsens with deep breathing and certain positions. He rates the pain as a 3 out of 10 when sitting still, with increased pain during movement. He describes the pain as a handicap, especially when it occurs multiple times a week.  He has noticed increased coughing recently, which he associates with his current pain. No nausea, vomiting, diarrhea, or changes in appetite are reported.  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 He experiences cramping in his side if he does not stay hydrated, especially in hot weather or during physical exertion. He previously worked outdoors, which exacerbated these symptoms.  He has been taking 800 mg ibuprofen as needed for leg pain, which he also used for his current chest pain, but he is unsure of its effectiveness.  Chart shows fatty liver by ultrasound and hepatic elastography and h/o regular etoh consumption. No abd  CT done.  Lab Results  Component Value Date   ALT 35 06/28/2023   AST 24 06/28/2023   ALKPHOS 81 06/28/2023   BILITOT 0.4 06/28/2023   Lab Results  Component Value Date   NA 140 06/28/2023   CL 98 06/28/2023   K 4.5 06/28/2023   CO2 19 (L) 06/28/2023   BUN 12 06/28/2023   CREATININE 1.30 (H) 06/28/2023   EGFR 73 06/28/2023   CALCIUM 9.9 06/28/2023   ALBUMIN 5.1 06/28/2023   GLUCOSE 91 06/28/2023         Assessment  1. Right-sided chest wall pain   2. Chronic right upper quadrant pain   3. Fatty liver      Plan  Assessment and Plan   difficult problem: chronic pain now worsened but sounds more muskuloskeletal than GI. We had a long discussion.  Chest wall pain Intermittent chest wall pain likely due to nerve irritation under the rib. Differential includes slipped rib syndrome, muscle strain, or costochondritis. No gallbladder or liver involvement. Persistent musculoskeletal pain may need orthopedic evaluation. - Order chest x-ray to evaluate lungs and rib structure. - Prescribe diclofenac twice daily with food for pain. - Prescribe tramadol as needed for additional pain relief. - Recommend rib brace for support and comfort. - Advise follow-up with primary care physician for further evaluation. - Consider referral to sports medicine or orthopedic specialist if pain persists.  Gilbert syndrome Benign condition with elevated bilirubin due to enzyme deficiency.  Not contributing to current symptoms.  Fatty liver Typically does not cause pain. Not contributing to current symptoms.        Orders Placed This Encounter  Procedures   DG Ribs Unilateral W/Chest Right   Meds ordered this encounter  Medications   diclofenac (VOLTAREN) 75 MG EC tablet    Sig: Take 1 tablet (75 mg total) by mouth 2 (two) times daily.    Dispense:  30 tablet    Refill:  0   traMADol (ULTRAM) 50 MG tablet    Sig: Take 1 tablet (50 mg total) by mouth every 6 (six) hours as needed for  moderate pain (pain score 4-6).    Dispense:  30 tablet    Refill:  0     I reviewed the patients updated PMH, FH, and SocHx.    Patient Active Problem List   Diagnosis Date Noted   Elevated fasting glucose 08/04/2023   Chronic pain of right knee 06/29/2023   Bilateral swelling of feet 06/29/2023   Bilateral foot pain 06/29/2023   Angular cheilitis 11/10/2022   Current smoker 11/10/2022   Class 2 obesity due to excess calories without serious comorbidity with body mass index (BMI) of 35.0 to 35.9 in adult 11/10/2022   Cellulitis of nasal tip 10/07/2022   GAD (generalized anxiety disorder) 07/27/2022   Insomnia 07/27/2022   OSA (obstructive sleep apnea) 07/14/2022   Right elbow pain 04/06/2022   Elevated ferritin 03/10/2021   Raised serum iron 03/10/2021   Folate deficiency 03/10/2021   Alcoholic fatty liver 03/06/2021   Tobacco dependence 03/05/2021   Elevated liver enzymes 03/05/2021   Alcohol abuse 03/05/2021   Fracture of right greater tuberosity and inferior glenoid 11/12/2020   Non-restorative sleep 10/20/2019   Frequent headaches 10/20/2019   Class 1 obesity due to excess calories with serious comorbidity and body mass index (BMI) of 32.0 to 32.9 in adult 10/20/2019   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 05/12/2019   Hypertriglyceridemia 04/10/2019   Perianal abscess 06/21/2017   Elevated ALT measurement 05/11/2017   RUQ abdominal pain 05/10/2017   Hypertension goal BP (blood pressure) < 130/80 04/09/2017   Alcohol consumption of more than two drinks per day 03/12/2017   Adult ADHD 03/12/2017   Elevated blood pressure reading 03/12/2017   Moderate episode of recurrent major depressive disorder (HCC) 03/12/2017   Social anxiety disorder 03/12/2017   Snoring 03/12/2017   At risk for obstructive sleep apnea 03/12/2017   Tobacco use 03/12/2017   Back pain 02/08/2014   Current Meds  Medication Sig   amphetamine-dextroamphetamine (ADDERALL) 20 MG tablet  Take 1 tablet (20 mg total) by mouth 2 (two) times daily.   amphetamine-dextroamphetamine (ADDERALL) 20 MG tablet Take 1 tablet (20 mg total) by mouth 2 (two) times daily.   amphetamine-dextroamphetamine (ADDERALL) 20 MG tablet Take 1 tablet (20 mg total) by mouth 2 (two) times daily.   celecoxib (CELEBREX) 200 MG capsule TAKE 1 CAPSULE BY MOUTH 2 TIMES A DAY   clonazePAM (KLONOPIN) 0.5 MG tablet Take 1 tablet (0.5 mg total) by mouth 2 (two) times daily as needed for anxiety.   diclofenac (VOLTAREN) 75 MG EC tablet Take 1 tablet (75 mg total) by mouth 2 (two) times daily.   FLUoxetine (PROZAC) 20 MG tablet Take 20 mg by mouth daily.   ketoconazole (NIZORAL) 2 % cream Apply 1 Application topically 2 (two) times daily. To affected areas for 2 weeks.   losartan-hydrochlorothiazide (HYZAAR) 100-12.5 MG tablet Take 1 tablet  by mouth daily.   traMADol (ULTRAM) 50 MG tablet Take 1 tablet (50 mg total) by mouth every 6 (six) hours as needed for moderate pain (pain score 4-6).   traZODone (DESYREL) 50 MG tablet Take 2-3 tablets at bedtime for sleep.   WEGOVY 0.25 MG/0.5ML SOAJ Inject 0.25 mg into the skin once a week. Use this dose for 1 month (4 shots) and then increase to next higher dose.    Allergies: Patient is allergic to norvasc [amlodipine]. Family History: Patient family history includes Diabetes in his father; Hyperlipidemia in his mother; Hypertension in his mother. Social History:  Patient  reports that he has been smoking cigarettes. He has a 15 pack-year smoking history. He has never used smokeless tobacco. He reports current alcohol use of about 7.0 standard drinks of alcohol per week. He reports that he does not use drugs.  Review of Systems: Constitutional: Negative for fever malaise or anorexia Cardiovascular: negative for chest pain Respiratory: negative for SOB or persistent cough Gastrointestinal: negative for abdominal pain  Objective  Vitals: BP 121/83   Pulse 82   Temp  97.7 F (36.5 C)   Ht 6' (1.829 m)   Wt 242 lb 12.8 oz (110.1 kg)   SpO2 98%   BMI 32.93 kg/m  General: no acute distress , A&Ox3, nontoxic apparent. Psych: good hygiene, normal eye contact. Normal speech. Normal mood HEENT: PEERL, conjunctiva normal, neck is supple Cardiovascular:  RRR without murmur or gallop.  Respiratory:  Good breath sounds bilaterally, CTAB with normal respiratory effort, right lower anterior ribs are ttp w/o crepitus or bruising or rash. Reproduces pain.  Gastrointestinal: soft, flat abdomen, normal active bowel sounds, no palpable masses, no hepatosplenomegaly, no appreciated hernias, nontender RUQ  Skin:  Warm, no rashes  Commons side effects, risks, benefits, and alternatives for medications and treatment plan prescribed today were discussed, and the patient expressed understanding of the given instructions. Patient is instructed to call or message via MyChart if he/she has any questions or concerns regarding our treatment plan. No barriers to understanding were identified. We discussed Red Flag symptoms and signs in detail. Patient expressed understanding regarding what to do in case of urgent or emergency type symptoms.  Medication list was reconciled, printed and provided to the patient in AVS. Patient instructions and summary information was reviewed with the patient as documented in the AVS. This note was prepared with assistance of Dragon voice recognition software. Occasional wrong-word or sound-a-like substitutions may have occurred due to the inherent limitations of voice recognition software

## 2023-12-06 NOTE — Telephone Encounter (Signed)
  Chief Complaint: upper right abdominal pain Symptoms: above Frequency: pt has been off and on mild for 15 years.  3 days ago pain became severe and constant. Pertinent Negatives: Patient denies any other s/s Disposition: [] ED /[] Urgent Care (no appt availability in office) / [x] Appointment(In office/virtual)/ []  [place] [facility]/ [] Home Care/ [] Refused Recommended Disposition /[]  Mobile Bus/ []  Follow-up with PCP Additional Notes: Appt made for this morning with another provider.  Pt states pain is severe. Pt is also requesting refill of adderall.    Copied from CRM [id number]. Topic: Clinical - Red Word Triage >> Dec 06, 2023  8:24 AM Dondra Prader A wrote: Red Word that prompted transfer to Nurse Triage: Patient is in pain, thinks it's an issue with his gallbladder or kidney stones. Reason for Disposition  [1] MILD-MODERATE pain AND [2] constant AND [3] present > 2 hours  Answer Assessment - Initial Assessment Questions 1. LOCATION: "Where does it hurt?"      Upper right abdominal 2. RADIATION: "Does the pain shoot anywhere else?" (e.g., chest, back)     Back seems like it's tightening 3. ONSET: "When did the pain begin?" (Minutes, hours or days ago)      15 years - 3 days acute 4. SUDDEN: "Gradual or sudden onset?"     Now sharp pain -  5. PATTERN "Does the pain come and go, or is it constant?"    - If it comes and goes: "How long does it last?" "Do you have pain now?"     (Note: Comes and goes means the pain is intermittent. It goes away completely between bouts.)    - If constant: "Is it getting better, staying the same, or getting worse?"      (Note: Constant means the pain never goes away completely; most serious pain is constant and gets worse.)      constant 6. SEVERITY: "How bad is the pain?"  (e.g., Scale 1-10; mild, moderate, or severe)    - MILD (1-3): Doesn't interfere with normal activities, abdomen soft and not tender to touch.     - MODERATE (4-7):  Interferes with normal activities or awakens from sleep, abdomen tender to touch.     - SEVERE (8-10): Excruciating pain, doubled over, unable to do any normal activities.       severe 7. RECURRENT SYMPTOM: "Have you ever had this type of stomach pain before?" If Yes, ask: "When was the last time?" and "What happened that time?"      yes 8. CAUSE: "What do you think is causing the stomach pain?"     gallbladder 9. RELIEVING/AGGRAVATING FACTORS: "What makes it better or worse?" (e.g., antacids, bending or twisting motion, bowel movement)     no 10. OTHER SYMPTOMS: "Do you have any other symptoms?" (e.g., back pain, diarrhea, fever, urination pain, vomiting)       Dark urine - may not be urinating enough.  Protocols used: Abdominal Pain - Male-A-AH

## 2023-12-06 NOTE — Progress Notes (Signed)
 Please call patient:please let him know that his xrays of his ribs and lungs are normal.

## 2023-12-07 MED ORDER — AMPHETAMINE-DEXTROAMPHETAMINE 20 MG PO TABS: 20.0000 mg | ORAL_TABLET | Freq: Two times a day (BID) | ORAL | 0 refills | Status: DC

## 2023-12-08 ENCOUNTER — Ambulatory Visit: Payer: Self-pay

## 2023-12-08 ENCOUNTER — Telehealth: Payer: Self-pay

## 2023-12-08 MED ORDER — TIZANIDINE HCL 4 MG PO TABS: 4.0000 mg | ORAL_TABLET | Freq: Four times a day (QID) | ORAL | 0 refills | Status: DC | PRN

## 2023-12-08 NOTE — Telephone Encounter (Signed)
 Copied from CRM [id number]. Topic: Clinical - Red Word Triage >> Dec 08, 2023  1:56 PM Brittney F wrote: [facility] that prompted transfer to Nurse Triage:  Adominal pain; patient states the pain is constant  Chief Complaint: abd pain Symptoms: pain Frequency: comes and goes; states been suffering for years Pertinent Negatives: Patient denies fever, urinary symptoms Disposition: [] ED /[] Urgent Care (no appt availability in office) / [x] Appointment(In office/virtual)/ []  [place] [facility]/ [] Home Care/ [] Refused Recommended Disposition /[] [place] [facility]/ []  Follow-up with PCP Additional Notes: very angry that he has to come back in for an apt when he was seen on Monday for the same issue.  States medications are not working and the pain is still there.  Would like to talk to someone in the office regarding why this wasn't taken care of on Monday.  Care advice given, denies questions; instructed to go to ER if becomes worse.   Reason for Disposition  [1] MILD-MODERATE pain AND [2] constant AND [3] present > 2 hours  Answer Assessment - Initial Assessment Questions 1. LOCATION: "Where does it hurt?"      Below the right pec 2. RADIATION: "Does the pain shoot anywhere else?" (e.g., chest, back)     back 3. ONSET: "When did the pain begin?" (Minutes, hours or days ago)      Sat. 4. SUDDEN: "Gradual or sudden onset?"     suddenly 5. PATTERN "Does the pain come and go, or is it constant?"    - If it comes and goes: "How long does it last?" "Do you have pain now?"     (Note: Comes and goes means the pain is intermittent. It goes away completely between bouts.)    - If constant: "Is it getting better, staying the same, or getting worse?"      (Note: Constant means the pain never goes away completely; most serious pain is constant and gets worse.)      Has had this pain for years, comes and goes 6. SEVERITY: "How bad is the pain?"  (e.g., Scale 1-10; mild, moderate, or severe)    - MILD  (1-3): Doesn't interfere with normal activities, abdomen soft and not tender to touch.     - MODERATE (4-7): Interferes with normal activities or awakens from sleep, abdomen tender to touch.     - SEVERE (8-10): Excruciating pain, doubled over, unable to do any normal activities.       10/10 7. RECURRENT SYMPTOM: "Have you ever had this type of stomach pain before?" If Yes, ask: "When was the last time?" and "What happened that time?"      Yes, have had this for years 8. CAUSE: "What do you think is causing the stomach pain?"     unknown 9. RELIEVING/AGGRAVATING FACTORS: "What makes it better or worse?" (e.g., antacids, bending or twisting motion, bowel movement)     no 10. OTHER SYMPTOMS: "Do you have any other symptoms?" (e.g., back pain, diarrhea, fever, urination pain, vomiting)       Back pain,  Protocols used: Abdominal Pain - Male-A-AH

## 2023-12-08 NOTE — Telephone Encounter (Signed)
 Was sent over to our office forwarding to you to review.  Copied from CRM [id number]. Topic: Clinical - Medical Advice >> Dec 08, 2023 11:07 AM Deaijah H wrote: Reason for CRM: Patient called in stating he's Not doing any better and would like to what to do next or if he needs to see a sports medicine doctor or an MRI completed. Please call [phone]

## 2023-12-08 NOTE — Addendum Note (Signed)
 Addended by: Jomarie Longs on: 12/08/2023 04:46 PM   Modules accepted: Orders

## 2023-12-09 ENCOUNTER — Ambulatory Visit (INDEPENDENT_AMBULATORY_CARE_PROVIDER_SITE_OTHER)

## 2023-12-09 ENCOUNTER — Ambulatory Visit (INDEPENDENT_AMBULATORY_CARE_PROVIDER_SITE_OTHER): Admitting: Family Medicine

## 2023-12-09 ENCOUNTER — Encounter: Payer: Self-pay | Admitting: Family Medicine

## 2023-12-09 VITALS — BP 145/83 | HR 85 | Ht 72.0 in | Wt 243.0 lb

## 2023-12-09 DIAGNOSIS — R718 Other abnormality of red blood cells: Secondary | ICD-10-CM | POA: Diagnosis not present

## 2023-12-09 DIAGNOSIS — R932 Abnormal findings on diagnostic imaging of liver and biliary tract: Secondary | ICD-10-CM | POA: Diagnosis not present

## 2023-12-09 DIAGNOSIS — R1011 Right upper quadrant pain: Secondary | ICD-10-CM | POA: Diagnosis not present

## 2023-12-09 DIAGNOSIS — R0781 Pleurodynia: Secondary | ICD-10-CM | POA: Diagnosis not present

## 2023-12-09 DIAGNOSIS — K759 Inflammatory liver disease, unspecified: Secondary | ICD-10-CM | POA: Diagnosis not present

## 2023-12-09 DIAGNOSIS — Z944 Liver transplant status: Secondary | ICD-10-CM | POA: Diagnosis not present

## 2023-12-09 DIAGNOSIS — K709 Alcoholic liver disease, unspecified: Secondary | ICD-10-CM | POA: Diagnosis not present

## 2023-12-09 NOTE — Progress Notes (Signed)
 Acute Office Visit  Subjective:     Patient ID: Luke Wilkerson, male    DOB: 11-02-1985, 38 y.o.   MRN: [id number]  Chief Complaint  Patient presents with   Abdominal Pain    HPI Patient is in today for RUQ pain x 15 years ago.  Started again at 4:30 Sat am  sharp and stabbing and seem positional. Last te last night.  Having normal BMs.    He says that typically in the past when he would get the episodes of sharp right upper quadrant pain they would usually just last for couple minutes and then ease off he might go days without pain or it might happen multiple times in 1 day.  But this is the first time its lasted for multiple days in a row and has been constant.  He says it has been constant since Saturday at 4:30 in the morning so for almost 6 days.  He actually did have an office visit with a provider on Monday for the pain.  He says that he does feel a little bit nauseated with it no fever or chills.  In the past when he is had the pain it has been worse if he gets dehydrated or if he twists and rotates to reach back for something in his vehicle sometimes that will trigger it.  He describes the pain as sharp.  And debilitating when it happens.  In the past he is taken ibuprofen 800 mg and says sometimes that will take the edge off.  More recently he was given diclofenac and tramadol and says it did not really help so he is no longer taking it.  Hx of fatty liver. On Wegovy.    ROS      Objective:    BP (!) 151/82   Pulse (!) 105   Ht 6' (1.829 m)   Wt 243 lb (110.2 kg)   SpO2 98%   BMI 32.96 kg/m    Physical Exam Vitals and nursing note reviewed.  Constitutional:      Appearance: Normal appearance.  HENT:     Head: Normocephalic and atraumatic.     Mouth/Throat:     Mouth: Mucous membranes are moist.     Pharynx: Oropharynx is clear.  Eyes:     Conjunctiva/sclera: Conjunctivae normal.  Cardiovascular:     Rate and Rhythm: Normal rate and regular rhythm.  Pulmonary:      Effort: Pulmonary effort is normal.     Breath sounds: Normal breath sounds.  Chest:     Comments: He is tender just over the anterior distal ribs.  Mildly tender in the right upper quadrant but again most tender directly over the rib cage. Skin:    General: Skin is warm and dry.  Neurological:     Mental Status: He is alert.  Psychiatric:        Mood and Affect: Mood normal.     No results found for any visits on 12/09/23.      Assessment & Plan:   Problem List Items Addressed This Visit       Other   RUQ abdominal pain   Relevant Orders   US ABDOMEN COMPLETE W/ELASTOGRAPHY   CBC with Differential/Platelet   CMP14+EGFR   Other Visit Diagnoses       Rib pain on right side    -  Primary   Relevant Orders   US ABDOMEN COMPLETE W/ELASTOGRAPHY   CBC with Differential/Platelet   CMP14+EGFR  We discussed that it sounds like his pain is most consistent with musculoskeletal pain over the ribs it sounds like it is triggered by position change and it is worse if he is dehydrated he says it actually gets a little bit better if he drinks tonic water it is usually sharp and just last for couple minutes and then eases off.  I will do a little additional workup today to include labs and will check gallbladder only because his pain has been persistent for 5 days today which is never happened before so it is a little bit unusual.  No orders of the defined types were placed in this encounter.   No follow-ups on file.  Nani Gasser, MD

## 2023-12-10 ENCOUNTER — Other Ambulatory Visit: Payer: Self-pay | Admitting: Physician Assistant

## 2023-12-10 DIAGNOSIS — M25561 Pain in right knee: Secondary | ICD-10-CM

## 2023-12-10 DIAGNOSIS — M79671 Pain in right foot: Secondary | ICD-10-CM

## 2023-12-10 DIAGNOSIS — M7989 Other specified soft tissue disorders: Secondary | ICD-10-CM

## 2023-12-10 LAB — CBC WITH DIFFERENTIAL/PLATELET
Basophils Absolute: 0.1 10*3/uL (ref 0.0–0.2)
Basos: 1 %
EOS (ABSOLUTE): 0.2 10*3/uL (ref 0.0–0.4)
Eos: 1 %
Hematocrit: 45.4 % (ref 37.5–51.0)
Hemoglobin: 15.9 g/dL (ref 13.0–17.7)
Immature Grans (Abs): 0.1 10*3/uL (ref 0.0–0.1)
Immature Granulocytes: 1 %
Lymphocytes Absolute: 4.4 10*3/uL — ABNORMAL HIGH (ref 0.7–3.1)
Lymphs: 37 %
MCH: 34.6 pg — ABNORMAL HIGH (ref 26.6–33.0)
MCHC: 35 g/dL (ref 31.5–35.7)
MCV: 99 fL — ABNORMAL HIGH (ref 79–97)
Monocytes Absolute: 1 10*3/uL — ABNORMAL HIGH (ref 0.1–0.9)
Monocytes: 8 %
Neutrophils Absolute: 6 10*3/uL (ref 1.4–7.0)
Neutrophils: 52 %
Platelets: 258 10*3/uL (ref 150–450)
RBC: 4.59 x10E6/uL (ref 4.14–5.80)
RDW: 12.3 % (ref 11.6–15.4)
WBC: 11.6 10*3/uL — ABNORMAL HIGH (ref 3.4–10.8)

## 2023-12-10 LAB — CMP14+EGFR
ALT: 63 IU/L — ABNORMAL HIGH (ref 0–44)
AST: 39 IU/L (ref 0–40)
Albumin: 4.7 g/dL (ref 4.1–5.1)
Alkaline Phosphatase: 92 IU/L (ref 44–121)
BUN/Creatinine Ratio: 17 (ref 9–20)
BUN: 16 mg/dL (ref 6–20)
Bilirubin Total: 0.8 mg/dL (ref 0.0–1.2)
CO2: 23 mmol/L (ref 20–29)
Calcium: 9.9 mg/dL (ref 8.7–10.2)
Chloride: 99 mmol/L (ref 96–106)
Creatinine, Ser: 0.93 mg/dL (ref 0.76–1.27)
Globulin, Total: 2.4 g/dL (ref 1.5–4.5)
Glucose: 77 mg/dL (ref 70–99)
Potassium: 4.6 mmol/L (ref 3.5–5.2)
Sodium: 139 mmol/L (ref 134–144)
Total Protein: 7.1 g/dL (ref 6.0–8.5)
eGFR: 109 mL/min/{1.73_m2} (ref >59)

## 2023-12-10 NOTE — Progress Notes (Signed)
 Hi Wayman, no anemia but the size of your red blood cells are a little on the large side this can come from excess alcohol but will also call the lab and see if we can add a B12 and a folate level just to make sure that you are not deficient.  Your ALT liver enzyme is elevated, similar to in the past.   Please call lab and add B12, folate and vitamin B1

## 2023-12-10 NOTE — Progress Notes (Signed)
 Hi Luke Wilkerson, ultrasound results are back.  It does show fatty infiltration of the liver.  Over time this can lead to scarring otherwise known as cirrhosis.  So it is really important to work on eating healthy, work on weight loss and cut back significantly on your alcohol intake.  Did not see any lesions in the gallbladder looks good no concerning findings there.  So I do think this has more to do with your rib cage and that chest wall.  I think when it starts to bother you or hurt I would recommend taking your ibuprofen or Aleve over-the-counter and using heat or ice until you get relief.  If it continues after the weekend then let me know.

## 2023-12-10 NOTE — Telephone Encounter (Signed)
 Patient advised.

## 2023-12-13 NOTE — Progress Notes (Signed)
 Next option would be to consider CT of the chest for further workup would he like to do that?

## 2023-12-14 ENCOUNTER — Other Ambulatory Visit: Payer: Self-pay

## 2023-12-14 DIAGNOSIS — E538 Deficiency of other specified B group vitamins: Secondary | ICD-10-CM

## 2023-12-14 LAB — B12 AND FOLATE PANEL
Folate: 2.4 ng/mL — ABNORMAL LOW (ref >3.0)
Vitamin B-12: 514 pg/mL (ref 232–1245)

## 2023-12-14 LAB — SPECIMEN STATUS REPORT

## 2023-12-14 MED ORDER — IBUPROFEN 800 MG PO TABS: 800.0000 mg | ORAL_TABLET | Freq: Three times a day (TID) | ORAL | 0 refills | Status: DC | PRN

## 2023-12-14 NOTE — Addendum Note (Signed)
 Addended by: Araceli Knight on: 12/14/2023 04:02 PM   Modules accepted: Orders

## 2023-12-14 NOTE — Progress Notes (Signed)
 Hi Luke Wilkerson, your B12 level looks good but your folate is low.  I would recommend picking up a over-the-counter bottle of folate/folic acid and start taking 1 a day this is important for your red blood cell health.  Can recheck your folate level in about 2 months.

## 2023-12-23 ENCOUNTER — Other Ambulatory Visit: Payer: Self-pay | Admitting: Physician Assistant

## 2024-01-17 ENCOUNTER — Other Ambulatory Visit: Payer: Self-pay | Admitting: Physician Assistant

## 2024-01-17 DIAGNOSIS — F4010 Social phobia, unspecified: Secondary | ICD-10-CM

## 2024-01-18 NOTE — Telephone Encounter (Signed)
 Last OV: 12/09/23 (Dr. Greer Leak) Next OV: 03/20/24 Last RF: 06/28/23

## 2024-03-15 ENCOUNTER — Other Ambulatory Visit: Payer: Self-pay | Admitting: Physician Assistant

## 2024-03-15 DIAGNOSIS — I10 Essential (primary) hypertension: Secondary | ICD-10-CM

## 2024-03-20 ENCOUNTER — Ambulatory Visit: Admitting: Physician Assistant

## 2024-03-20 DIAGNOSIS — R1011 Right upper quadrant pain: Secondary | ICD-10-CM

## 2024-03-20 DIAGNOSIS — R0789 Other chest pain: Secondary | ICD-10-CM

## 2024-03-20 DIAGNOSIS — R0781 Pleurodynia: Secondary | ICD-10-CM

## 2024-03-31 ENCOUNTER — Other Ambulatory Visit: Payer: Self-pay | Admitting: Physician Assistant

## 2024-03-31 ENCOUNTER — Telehealth: Payer: Self-pay | Admitting: Physician Assistant

## 2024-03-31 DIAGNOSIS — F4010 Social phobia, unspecified: Secondary | ICD-10-CM

## 2024-03-31 DIAGNOSIS — F909 Attention-deficit hyperactivity disorder, unspecified type: Secondary | ICD-10-CM

## 2024-03-31 MED ORDER — CLONAZEPAM 0.5 MG PO TABS: ORAL_TABLET | ORAL | 0 refills | Status: DC

## 2024-03-31 MED ORDER — AMPHETAMINE-DEXTROAMPHETAMINE 20 MG PO TABS: 20.0000 mg | ORAL_TABLET | Freq: Two times a day (BID) | ORAL | 0 refills | Status: DC

## 2024-03-31 NOTE — Telephone Encounter (Signed)
 Copied from CRM [id number]. Topic: Clinical - Medication Refill >> Mar 31, 2024  9:34 AM Kevelyn M wrote: Medication: amphetamine -dextroamphetamine  (ADDERALL) 20 MG tablet, clonazePAM  (KLONOPIN ) 0.5 MG tablet   Has the patient contacted their pharmacy? Yes (Agent: If no, request that the patient contact the pharmacy for the refill. If patient does not wish to contact the pharmacy document the reason why and proceed with request.) (Agent: If yes, when and what did the pharmacy advise?)  This is the patient's preferred pharmacy:  [facility] [id number] [address] [place], [place] - [address] [place] [address] Phone: [phone] Fax: [phone]  Is this the correct pharmacy for this prescription? Yes If no, delete pharmacy and type the correct one.   Has the prescription been filled recently? Yes  Is the patient out of the medication? Yes  Has the patient been seen for an appointment in the last year OR does the patient have an upcoming appointment? Yes  Can we respond through MyChart? No  Agent: Please be advised that Rx refills may take up to 3 business days. We ask that you follow-up with your pharmacy.

## 2024-03-31 NOTE — Telephone Encounter (Signed)
 Copied from CRM [id number]. Topic: Clinical - Prescription Issue >> Mar 31, 2024  9:37 AM Kevelyn M wrote: Reason for CRM: Patient stated he called in about 2 days ago about his med refills for clonazePAM  (KLONOPIN ) 0.5 MG tablet  and amphetamine -dextroamphetamine  (ADDERALL) 20 MG tablet but there is not record of him calling.

## 2024-04-10 ENCOUNTER — Ambulatory Visit: Admitting: Physician Assistant

## 2024-04-10 ENCOUNTER — Encounter: Payer: Self-pay | Admitting: Physician Assistant

## 2024-04-10 VITALS — BP 158/98 | HR 77 | Wt 239.0 lb

## 2024-04-10 DIAGNOSIS — I10 Essential (primary) hypertension: Secondary | ICD-10-CM

## 2024-04-10 DIAGNOSIS — F4010 Social phobia, unspecified: Secondary | ICD-10-CM | POA: Diagnosis not present

## 2024-04-10 DIAGNOSIS — F909 Attention-deficit hyperactivity disorder, unspecified type: Secondary | ICD-10-CM

## 2024-04-10 DIAGNOSIS — R0789 Other chest pain: Secondary | ICD-10-CM

## 2024-04-10 DIAGNOSIS — F339 Major depressive disorder, recurrent, unspecified: Secondary | ICD-10-CM

## 2024-04-10 DIAGNOSIS — F411 Generalized anxiety disorder: Secondary | ICD-10-CM

## 2024-04-10 MED ORDER — AMPHETAMINE-DEXTROAMPHETAMINE 20 MG PO TABS: 20.0000 mg | ORAL_TABLET | Freq: Two times a day (BID) | ORAL | 0 refills | Status: DC

## 2024-04-10 MED ORDER — CLONAZEPAM 0.5 MG PO TABS
ORAL_TABLET | ORAL | 1 refills | Status: AC
Start: 2024-04-10 — End: ?

## 2024-04-10 MED ORDER — VORTIOXETINE HBR 5 MG PO TABS: 5.0000 mg | ORAL_TABLET | Freq: Every day | ORAL | 2 refills | Status: DC

## 2024-04-10 MED ORDER — LOSARTAN POTASSIUM-HCTZ 100-25 MG PO TABS: 1.0000 | ORAL_TABLET | Freq: Every day | ORAL | 0 refills | Status: DC

## 2024-04-10 NOTE — Patient Instructions (Signed)
 Start Trintellix  daily Klonapin only as needed Increased hyzaar to 100/25mg 

## 2024-04-10 NOTE — Progress Notes (Signed)
 Established Patient Office Visit  Subjective   Patient ID: Luke Wilkerson, male    DOB: 06/29/1986  Age: 38 y.o. MRN: [id number]  Chief Complaint  Patient presents with   Medical Management of Chronic Issues    HPI Pt is a 38 yo male with HTN, GAD, ADHD who presents to the clinic to follow up.   He has been really anxious lately. He is under a lot of stress. He is using his klonapin a few times a week. He is not checking his BP at home. He denies any SOB, CP, palpitations, headaches or vision changes. He needs refills of medication. He is ready to start something daily for depression and anxiety. He has failed zoloft /lexapro /prozac  in the past.    ROS See HPI.    Objective:     BP (!) 158/98   Pulse 77   Wt 239 lb (108.4 kg)   BMI 32.41 kg/m  BP Readings from Last 3 Encounters:  04/10/24 (!) 158/98  12/09/23 (!) 145/83  12/06/23 121/83   Wt Readings from Last 3 Encounters:  04/10/24 239 lb (108.4 kg)  12/09/23 243 lb (110.2 kg)  12/06/23 242 lb 12.8 oz (110.1 kg)    ..    04/10/2024    1:27 PM 12/06/2023    9:29 AM 06/28/2023    5:16 PM 11/09/2022    3:11 PM 11/09/2022    2:55 PM  Depression screen PHQ 2/9  Decreased Interest 1 0 0 0 0  Down, Depressed, Hopeless 1 0 0 0 0  PHQ - 2 Score 2 0 0 0 0  Altered sleeping 1  3 0   Tired, decreased energy 2  1 0   Change in appetite 2  1 0   Feeling bad or failure about yourself  1  0 0   Trouble concentrating 1  1 0   Moving slowly or fidgety/restless 1  0 0   Suicidal thoughts 0  0 0   PHQ-9 Score 10  6 0   Difficult doing work/chores Extremely dIfficult  Extremely dIfficult Not difficult at all    .SABRA    04/10/2024    1:27 PM 12/06/2023    9:29 AM 06/28/2023    5:16 PM 11/09/2022    3:11 PM  GAD 7 : Generalized Anxiety Score  Nervous, Anxious, on Edge 3 0 0 0  Control/stop worrying 3 0 1 0  Worry too much - different things 3 0 1 0  Trouble relaxing 3 0 1 0  Restless 3 0 1 0  Easily annoyed or irritable 3 0 3  0  Afraid - awful might happen 3 0 0 0  Total GAD 7 Score 21 0 7 0  Anxiety Difficulty Extremely difficult Not difficult at all Extremely difficult Not difficult at all      Physical Exam Constitutional:      Appearance: Normal appearance. He is obese.  HENT:     Head: Normocephalic.  Cardiovascular:     Rate and Rhythm: Normal rate and regular rhythm.  Pulmonary:     Effort: Pulmonary effort is normal.     Breath sounds: Normal breath sounds.  Musculoskeletal:     Right lower leg: No edema.     Left lower leg: No edema.  Neurological:     General: No focal deficit present.     Mental Status: He is alert and oriented to person, place, and time.  Psychiatric:  Mood and Affect: Mood normal.       Assessment & Plan:  SABRASABRAElzie was seen today for medical management of chronic issues.  Diagnoses and all orders for this visit:  Hypertension goal BP (blood pressure) < 130/80 -     losartan -hydrochlorothiazide  (HYZAAR) 100-25 MG tablet; Take 1 tablet by mouth daily.  Adult ADHD -     amphetamine -dextroamphetamine  (ADDERALL) 20 MG tablet; Take 1 tablet (20 mg total) by mouth 2 (two) times daily. -     amphetamine -dextroamphetamine  (ADDERALL) 20 MG tablet; Take 1 tablet (20 mg total) by mouth 2 (two) times daily. -     amphetamine -dextroamphetamine  (ADDERALL) 20 MG tablet; Take 1 tablet (20 mg total) by mouth 2 (two) times daily.  Social anxiety disorder -     clonazePAM  (KLONOPIN ) 0.5 MG tablet; TAKE ONE TABLET BY MOUTH TWICE A DAY AS NEEDED FOR ANXIETY  GAD (generalized anxiety disorder) -     vortioxetine  HBr (TRINTELLIX ) 5 MG TABS tablet; Take 1 tablet (5 mg total) by mouth daily.  Depression, recurrent (HCC) -     vortioxetine  HBr (TRINTELLIX ) 5 MG TABS tablet; Take 1 tablet (5 mg total) by mouth daily.   BP not to goal Increased Hyzaar to 100/25mg  daily Pt cannot tolerate norvasc  and does not want to be on 2 blood pressure medications Discussed importance of  him checking BP at home to keep a log Avoid alcohol and salt .SABRAPDMP reviewed during this encounter. Last filled klonapin 8/1 not due for refill yet, use sparingly PHQ/GAD not to goal Start trintellix  daily, failed zoloft , lexapro , prozac  per patient Refilled adderall    Return in about 4 weeks (around 05/08/2024).    Brinlyn Cena, PA-C

## 2024-04-11 ENCOUNTER — Encounter: Payer: Self-pay | Admitting: Physician Assistant

## 2024-04-11 DIAGNOSIS — F339 Major depressive disorder, recurrent, unspecified: Secondary | ICD-10-CM | POA: Insufficient documentation

## 2024-04-12 NOTE — Telephone Encounter (Signed)
 This has been addressed.

## 2024-05-04 ENCOUNTER — Encounter: Payer: Self-pay | Admitting: Sports Medicine

## 2024-06-01 ENCOUNTER — Other Ambulatory Visit: Payer: Self-pay | Admitting: Physician Assistant

## 2024-07-12 ENCOUNTER — Ambulatory Visit: Admitting: Physician Assistant

## 2024-07-12 DIAGNOSIS — F909 Attention-deficit hyperactivity disorder, unspecified type: Secondary | ICD-10-CM

## 2024-07-12 DIAGNOSIS — F411 Generalized anxiety disorder: Secondary | ICD-10-CM

## 2024-07-12 DIAGNOSIS — I10 Essential (primary) hypertension: Secondary | ICD-10-CM

## 2024-07-12 DIAGNOSIS — F4010 Social phobia, unspecified: Secondary | ICD-10-CM

## 2024-07-18 ENCOUNTER — Ambulatory Visit: Admitting: Physician Assistant

## 2024-08-01 ENCOUNTER — Ambulatory Visit: Admitting: Physician Assistant

## 2024-08-01 VITALS — BP 180/100 | HR 102 | Ht 72.0 in | Wt 240.0 lb

## 2024-08-01 DIAGNOSIS — F411 Generalized anxiety disorder: Secondary | ICD-10-CM | POA: Diagnosis not present

## 2024-08-01 DIAGNOSIS — F909 Attention-deficit hyperactivity disorder, unspecified type: Secondary | ICD-10-CM | POA: Diagnosis not present

## 2024-08-01 DIAGNOSIS — F1090 Alcohol use, unspecified, uncomplicated: Secondary | ICD-10-CM

## 2024-08-01 DIAGNOSIS — F4010 Social phobia, unspecified: Secondary | ICD-10-CM | POA: Diagnosis not present

## 2024-08-01 DIAGNOSIS — M79671 Pain in right foot: Secondary | ICD-10-CM

## 2024-08-01 DIAGNOSIS — I10 Essential (primary) hypertension: Secondary | ICD-10-CM

## 2024-08-01 DIAGNOSIS — G4700 Insomnia, unspecified: Secondary | ICD-10-CM

## 2024-08-01 DIAGNOSIS — F339 Major depressive disorder, recurrent, unspecified: Secondary | ICD-10-CM

## 2024-08-01 DIAGNOSIS — G4733 Obstructive sleep apnea (adult) (pediatric): Secondary | ICD-10-CM

## 2024-08-01 MED ORDER — VORTIOXETINE HBR 10 MG PO TABS: 10.0000 mg | ORAL_TABLET | Freq: Every day | ORAL | 1 refills | Status: AC

## 2024-08-01 MED ORDER — VALSARTAN-HYDROCHLOROTHIAZIDE 320-25 MG PO TABS: 1.0000 | ORAL_TABLET | Freq: Every day | ORAL | 0 refills | Status: AC

## 2024-08-01 MED ORDER — AMLODIPINE BESYLATE 5 MG PO TABS: 5.0000 mg | ORAL_TABLET | Freq: Every day | ORAL | 0 refills | Status: AC

## 2024-08-01 MED ORDER — TRAZODONE HCL 50 MG PO TABS: ORAL_TABLET | ORAL | 1 refills | Status: AC

## 2024-08-01 NOTE — Patient Instructions (Addendum)
 Stop Hyzaar and start Diovan /hydrochlorothiazide  and norvasc  5mg  daily.  Nurse visit BP check.  Increased Trintellix  10mg  daily.  Referral to sleep medicine to consider other options for OSA treatment.

## 2024-08-07 ENCOUNTER — Encounter: Payer: Self-pay | Admitting: Physician Assistant

## 2024-08-07 DIAGNOSIS — F1090 Alcohol use, unspecified, uncomplicated: Secondary | ICD-10-CM | POA: Insufficient documentation

## 2024-08-07 DIAGNOSIS — M79671 Pain in right foot: Secondary | ICD-10-CM | POA: Insufficient documentation

## 2024-08-07 NOTE — Progress Notes (Signed)
 Established Patient Office Visit  Subjective   Patient ID: Luke Wilkerson, male    DOB: 1985-12-22  Age: 38 y.o. MRN: [id number]  Chief Complaint  Patient presents with   Medical Management of Chronic Issues    HPI Discussed the use of AI scribe software for clinical note transcription with the patient, who gave verbal consent to proceed.  History of Present Illness Luke Wilkerson is a 38 year old male with hypertension who presents for blood pressure management.  Hypertension management - Hypertension managed with medication. - Previously used Norvasc  (amlodipine ), discontinued due to foot pain and swelling. - No chest pain.  Foot pain and swelling - Foot pain and swelling localized to one foot. - Pain exacerbated by pressure on certain areas. - Suspects injury from reaching in his truck, possibly a pulled ligament. - Significant discomfort resulting in days spent lying on the couch and applying ice. - Uses a large bandage for support. - Considering supportive shoes and orthotics for improved footwear.  Psychological stress and sleep disturbance - Significant stress following the recent death of his father, who had a history of stroke and poorly managed diabetes. - Stress has affected sleep and overall well-being. - Challenges with stress management, especially with a two and a half year old daughter. - Not sleeping well. - Discontinued CPAP use due to discomfort and a recent cold.  Alcohol use - Consumes alcohol in large quantities on occasion. - Acknowledges negative impact of alcohol on health.  Mood and medication adherence - Currently taking Trintellix  for mood, finds it ineffective. - Not taking trazodone  for sleep, prefers to stay awake until his daughter falls asleep. - Has set up phone reminders to improve medication adherence.     ROS See HPI.    Objective:     BP (!) 180/100   Pulse (!) 102   Ht 6' (1.829 m)   Wt 240 lb (108.9 kg)   SpO2 98%   BMI  32.55 kg/m  BP Readings from Last 3 Encounters:  08/01/24 (!) 180/100  04/10/24 (!) 158/98  12/09/23 (!) 145/83   Wt Readings from Last 3 Encounters:  08/01/24 240 lb (108.9 kg)  04/10/24 239 lb (108.4 kg)  12/09/23 243 lb (110.2 kg)      Physical Exam Constitutional:      Appearance: Normal appearance. He is obese.  HENT:     Head: Normocephalic.  Cardiovascular:     Rate and Rhythm: Normal rate and regular rhythm.  Pulmonary:     Effort: Pulmonary effort is normal.     Breath sounds: Normal breath sounds.  Musculoskeletal:     Right lower leg: No edema.     Left lower leg: No edema.  Neurological:     General: No focal deficit present.     Mental Status: He is alert and oriented to person, place, and time.  Psychiatric:        Mood and Affect: Mood normal.          Assessment & Plan:  Luke Wilkerson was seen today for medical management of chronic issues.  Diagnoses and all orders for this visit:  Uncontrolled hypertension -     amLODipine  (NORVASC ) 5 MG tablet; Take 1 tablet (5 mg total) by mouth daily. -     valsartan -hydrochlorothiazide  (DIOVAN -HCT) 320-25 MG tablet; Take 1 tablet by mouth daily.  Adult ADHD  Social anxiety disorder -     vortioxetine  HBr (TRINTELLIX ) 10 MG TABS tablet; Take 1 tablet (10 mg  total) by mouth daily.  GAD (generalized anxiety disorder) -     vortioxetine  HBr (TRINTELLIX ) 10 MG TABS tablet; Take 1 tablet (10 mg total) by mouth daily.  Hypertension goal BP (blood pressure) < 130/80 -     amLODipine  (NORVASC ) 5 MG tablet; Take 1 tablet (5 mg total) by mouth daily. -     valsartan -hydrochlorothiazide  (DIOVAN -HCT) 320-25 MG tablet; Take 1 tablet by mouth daily.  Depression, recurrent -     vortioxetine  HBr (TRINTELLIX ) 10 MG TABS tablet; Take 1 tablet (10 mg total) by mouth daily.  OSA (obstructive sleep apnea)  Insomnia, unspecified type -     traZODone  (DESYREL ) 50 MG tablet; Take 2-3 tablets at bedtime for  sleep.   Assessment & Plan Primary Hypertension Hypertension poorly controlled, likely exacerbated by stress and alcohol. Current regimen insufficient. - Discontinued Losartan -hydrochlorothiazide  (Hyzaar). - Initiated Diovan  (valsartan -hydrochlorothiazide ) in the morning. - Added amlodipine  5 mg at night. - Advised home blood pressure monitoring and reporting. - Instructed to limit salt intake and avoid alcohol. - Scheduled nurse visit in two weeks for blood pressure management.  ADHD - holding Adderall until we can get BP closer to goal  Obstructive Sleep Apnea Obstructive sleep apnea may contribute to uncontrolled hypertension. Previous CPAP use not tolerated. - Will consider Inspire technology for sleep apnea management. - Referral placed to sleep medicine.   Insomnia Insomnia persists, potentially exacerbated by stress and anxiety. Discussed trazodone  dosage and timing adjustments. - Consider adjusting trazodone  dosage and timing to improve sleep.  Depression Managed with Trintellix , but current dose may be insufficient due to recent stressors. - Increased Trintellix  (vilazodone) dosage.  Right Foot Pain (suspected plantar fasciitis/extensor tendon strain) Right foot pain likely due to plantar fasciitis or extensor tendon strain, exacerbated by pressure. - Recommended foam rollers and supportive shoes. - Will consider orthotics if pain persists.  Alcohol Use Excessive alcohol consumption may contribute to elevated blood pressure and health risks. - Advised to reduce alcohol consumption.     Return in about 2 weeks (around 08/15/2024) for nurse visit BP check.    Brandley Aldrete, PA-C

## 2024-08-15 ENCOUNTER — Ambulatory Visit

## 2024-08-16 ENCOUNTER — Ambulatory Visit

## 2024-08-16 VITALS — BP 134/85 | HR 96 | Resp 18 | Ht 72.0 in | Wt 240.0 lb

## 2024-08-16 DIAGNOSIS — F909 Attention-deficit hyperactivity disorder, unspecified type: Secondary | ICD-10-CM

## 2024-08-16 MED ORDER — TIZANIDINE HCL 4 MG PO TABS: 4.0000 mg | ORAL_TABLET | Freq: Four times a day (QID) | ORAL | 0 refills | Status: AC | PRN

## 2024-08-16 MED ORDER — AMPHETAMINE-DEXTROAMPHETAMINE 20 MG PO TABS: 20.0000 mg | ORAL_TABLET | Freq: Two times a day (BID) | ORAL | 0 refills | Status: AC

## 2024-08-16 NOTE — Addendum Note (Signed)
 Addended byBETHA ANTONIETTE VERMELL LITTIE on: 08/16/2024 02:50 PM   Modules accepted: Orders

## 2024-08-16 NOTE — Progress Notes (Signed)
° °  Subjective:    Patient ID: Luke Wilkerson, male    DOB: 1986/01/02, 38 y.o.   MRN: [id number]  HPI  Patient is here for a 2 wk BP recheck. Last OV it was 180/100. Patient currently takes amlodipine  4 mg, valsartan -hydrochlorothiazide  320-25mg . Patient is also asking for a refill of the tizanidine  Denies CP, SOB, vision changes, or medication problems.  Review of Systems     Objective:   Physical Exam        Assessment & Plan:   Patients first BP is 140/84 and his second 134/85. Reported to Jade who is satisfied with these readings. Advised patient that he could go back on his adderall since BP is controlled now. Pended today and sent to Jade. No change to the current regimen. Tizanidine  sent in today per [facility].

## 2024-08-20 ENCOUNTER — Ambulatory Visit
Admission: EM | Admit: 2024-08-20 | Discharge: 2024-08-20 | Disposition: A | Discharge status: Home / Self Care | Attending: Family Medicine | Admitting: Family Medicine

## 2024-08-20 ENCOUNTER — Ambulatory Visit

## 2024-08-20 ENCOUNTER — Other Ambulatory Visit: Payer: Self-pay

## 2024-08-20 DIAGNOSIS — M79672 Pain in left foot: Secondary | ICD-10-CM

## 2024-08-20 MED ORDER — CELECOXIB 200 MG PO CAPS: 200.0000 mg | ORAL_CAPSULE | Freq: Every day | ORAL | 0 refills | Status: AC

## 2024-08-20 NOTE — ED Triage Notes (Signed)
 Pt presenting with c/o pain to left foot. Pt stated that he hurt his foot a month ago unsure of exactly what happened and he is now experiencing increasing pain. Pt stated that he has been taking Ibuprofen  for the pain, last taken yesterday with minimal effectiveness.

## 2024-08-20 NOTE — ED Provider Notes (Signed)
 " [name]    CSN: [id number] Arrival date & time: 08/20/24  0801      History   Chief Complaint Chief Complaint  Patient presents with   Foot Pain    HPI Luke Wilkerson is a 38 y.o. male.   HPI 38 year old male presents with left foot pain for 1 month.  Patient is now experiencing increasing pain.  PMH significant for obesity, HTN, and hypertriglyceridemia.  Past Medical History:  Diagnosis Date   ADHD    Anxiety    Elevated ALT measurement 05/11/2017   AST:ALT 0.5   Hypertension    Hypertriglyceridemia 04/10/2019   Obesity    Pneumonia 2016    Patient Active Problem List   Diagnosis Date Noted   Alcohol use 08/07/2024   Right foot pain 08/07/2024   Uncontrolled hypertension 08/01/2024   Depression, recurrent 04/11/2024   Elevated fasting glucose 08/04/2023   Chronic pain of right knee 06/29/2023   Bilateral swelling of feet 06/29/2023   Bilateral foot pain 06/29/2023   Angular cheilitis 11/10/2022   Current smoker 11/10/2022   Class 2 obesity due to excess calories without serious comorbidity with body mass index (BMI) of 35.0 to 35.9 in adult 11/10/2022   Cellulitis of nasal tip 10/07/2022   GAD (generalized anxiety disorder) 07/27/2022   Insomnia 07/27/2022   OSA (obstructive sleep apnea) 07/14/2022   Right elbow pain 04/06/2022   Elevated ferritin 03/10/2021   Raised serum iron 03/10/2021   Folate deficiency 03/10/2021   Alcoholic fatty liver 03/06/2021   Tobacco dependence 03/05/2021   Elevated liver enzymes 03/05/2021   Alcohol abuse 03/05/2021   Fracture of right greater tuberosity and inferior glenoid 11/12/2020   Non-restorative sleep 10/20/2019   Frequent headaches 10/20/2019   Class 1 obesity due to excess calories with serious comorbidity and body mass index (BMI) of 32.0 to 32.9 in adult 10/20/2019   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 05/12/2019   Hypertriglyceridemia 04/10/2019   Perianal abscess 06/21/2017    Elevated ALT measurement 05/11/2017   RUQ abdominal pain 05/10/2017   Hypertension goal BP (blood pressure) < 130/80 04/09/2017   Alcohol consumption of more than two drinks per day 03/12/2017   Adult ADHD 03/12/2017   Elevated blood pressure reading 03/12/2017   Moderate episode of recurrent major depressive disorder (HCC) 03/12/2017   Social anxiety disorder 03/12/2017   Snoring 03/12/2017   At risk for obstructive sleep apnea 03/12/2017   Tobacco use 03/12/2017   Back pain 02/08/2014    Past Surgical History:  Procedure Laterality Date   EVALUATION UNDER ANESTHESIA WITH HEMORRHOIDECTOMY N/A 05/12/2019   Procedure: ANORECTAL EXAM UNDER ANESTHESIA WITH LIFT REPAIR;  Surgeon: Sheldon Standing, MD;  Location: [facility];  Service: General;  Laterality: N/A;   FRACTURE SURGERY Right 2008   Rt knee   FRACTURE SURGERY Right 2016   Rt wrist from old injury   LEG SURGERY     WRIST ARTHROSCOPY         Home Medications    Prior to Admission medications  Medication Sig Start Date End Date Taking? Authorizing Provider  celecoxib  (CELEBREX ) 200 MG capsule Take 1 capsule (200 mg total) by mouth daily for 15 days. 08/20/24 09/04/24 Yes Teddy Sharper, FNP  amLODipine  (NORVASC ) 5 MG tablet Take 1 tablet (5 mg total) by mouth daily. 08/01/24   Breeback, Jade L, PA-C  amphetamine -dextroamphetamine  (ADDERALL) 20 MG tablet Take 1 tablet (20 mg total) by mouth 2 (two) times daily. 08/16/24  Breeback, Jade L, PA-C  amphetamine -dextroamphetamine  (ADDERALL) 20 MG tablet Take 1 tablet (20 mg total) by mouth 2 (two) times daily. 09/15/24   Breeback, Jade L, PA-C  amphetamine -dextroamphetamine  (ADDERALL) 20 MG tablet Take 1 tablet (20 mg total) by mouth 2 (two) times daily. 10/15/24   Breeback, Jade L, PA-C  clonazePAM  (KLONOPIN ) 0.5 MG tablet TAKE ONE TABLET BY MOUTH TWICE A DAY AS NEEDED FOR ANXIETY 04/10/24   Breeback, Jade L, PA-C  tiZANidine  (ZANAFLEX ) 4 MG tablet Take 1 tablet (4 mg total) by mouth every 6  (six) hours as needed for muscle spasms. 08/16/24   Breeback, Jade L, PA-C  traZODone  (DESYREL ) 50 MG tablet Take 2-3 tablets at bedtime for sleep. 08/01/24   Breeback, Jade L, PA-C  valsartan -hydrochlorothiazide  (DIOVAN -HCT) 320-25 MG tablet Take 1 tablet by mouth daily. 08/01/24   Breeback, Jade L, PA-C  vortioxetine  HBr (TRINTELLIX ) 10 MG TABS tablet Take 1 tablet (10 mg total) by mouth daily. 08/01/24   Antoniette Vermell CROME, PA-C    Family History Family History  Problem Relation Age of Onset   Hypertension Mother    Hyperlipidemia Mother    Diabetes Father     Social History Social History[1]   Allergies   Norvasc  [amlodipine ]   Review of Systems Review of Systems  Musculoskeletal:        Left foot pain was 1 month     Physical Exam Triage Vital Signs ED Triage Vitals [08/20/24 0821]  Encounter Vitals Group     BP 138/89     Girls Systolic BP Percentile      Girls Diastolic BP Percentile      Boys Systolic BP Percentile      Boys Diastolic BP Percentile      Pulse Rate 84     Resp 18     Temp 98.2 F (36.8 C)     Temp Source Oral     SpO2 98 %     Weight      Height      Head Circumference      Peak Flow      Pain Score      Pain Loc      Pain Education      Exclude from Growth Chart    No data found.  Updated Vital Signs BP 138/89 (BP Location: Right Arm)   Pulse 84   Temp 98.2 F (36.8 C) (Oral)   Resp 18   Ht 5' 11 (1.803 m)   Wt 240 lb (108.9 kg)   SpO2 98%   BMI 33.47 kg/m   Visual Acuity Right Eye Distance:   Left Eye Distance:   Bilateral Distance:    Right Eye Near:   Left Eye Near:    Bilateral Near:     Physical Exam Vitals and nursing note reviewed.  Constitutional:      Appearance: Normal appearance. He is obese.  HENT:     Head: Normocephalic and atraumatic.     Mouth/Throat:     Mouth: Mucous membranes are moist.     Pharynx: Oropharynx is clear.  Eyes:     Extraocular Movements: Extraocular movements intact.      Conjunctiva/sclera: Conjunctivae normal.     Pupils: Pupils are equal, round, and reactive to light.  Cardiovascular:     Rate and Rhythm: Normal rate and regular rhythm.     Heart sounds: Normal heart sounds.  Pulmonary:     Effort: Pulmonary effort is normal.  Breath sounds: Normal breath sounds. No wheezing, rhonchi or rales.  Musculoskeletal:        General: Normal range of motion.     Comments: Left foot (dorsum): Patient complaining of pain/tenderness over superior 4th/5th metacarpals, no deformity noted  Skin:    General: Skin is warm and dry.  Neurological:     General: No focal deficit present.     Mental Status: He is alert and oriented to person, place, and time. Mental status is at baseline.  Psychiatric:        Mood and Affect: Mood normal.        Behavior: Behavior normal.      UC Treatments / Results  Labs (all labs ordered are listed, but only abnormal results are displayed) Labs Reviewed - No data to display  EKG   Radiology DG Foot Complete Left Result Date: 08/20/2024 EXAM: 3 OR MORE VIEW(S) XRAY OF THE LEFT FOOT 08/20/2024 08:44:29 AM COMPARISON: None available. CLINICAL HISTORY: Left foot pain for 1 month, currently using crutches. FINDINGS: BONES AND JOINTS: No acute fracture. No malalignment. Mild spurring of the first metatarsal phalangeal joint. SOFT TISSUES: The soft tissues are unremarkable. IMPRESSION: 1. No acute findings. 2. Mild spurring of the first metatarsophalangeal joint. Electronically signed by: Waddell Calk MD 08/20/2024 08:50 AM EST RP Workstation: [id number]    Procedures Procedures (including critical care time)  Medications Ordered in UC Medications - No data to display  Initial Impression / Assessment and Plan / UC Course  I have reviewed the triage vital signs and the nursing notes.  Pertinent labs & imaging results that were available during my care of the patient were reviewed by me and considered in my medical decision  making (see chart for details).     MDM: 1.  Foot pain, left-DG complete left foot revealed above, patient advised, Rx'd Celebrex  200 mg capsule: Take 1 capsule daily x 15 days. Advised patient of foot x-ray results with hardcopy and image provided.  Advised patient discontinue Advil  800 mg tablet and start Celebrex .  Advised take medication with food to completion.  Courage increase daily water intake to 64 ounces per day while taking this medication.  Advised if symptoms worsen and/or unresolved please follow-up with your PCP or here for further evaluation.  Advised if symptoms worsen and/or unresolved please follow-up with your PCP or [facility] podiatry for further evaluation of left foot pain.  Patient discharged home, hemodynamically stable. Final Clinical Impressions(s) / UC Diagnoses   Final diagnoses:  Foot pain, left     Discharge Instructions      Advised patient of foot x-ray results with hardcopy and image provided.  Advised patient discontinue Advil  800 mg tablet and start Celebrex .  Advised take medication with food to completion.  Courage increase daily water intake to 64 ounces per day while taking this medication.  Advised if symptoms worsen and/or unresolved please follow-up with your PCP or here for further evaluation.  Advised if symptoms worsen and/or unresolved please follow-up with your PCP or [facility] podiatry or orthopedics for further evaluation of left foot pain.     ED Prescriptions     Medication Sig Dispense Auth. Provider   celecoxib  (CELEBREX ) 200 MG capsule Take 1 capsule (200 mg total) by mouth daily for 15 days. 15 capsule Huong Luthi, FNP      I have reviewed the PDMP during this encounter.     [1]  Social History Tobacco Use   Smoking status: Every  Day    Current packs/day: 1.00    Average packs/day: 1 pack/day for 15.0 years (15.0 ttl pk-yrs)    Types: Cigarettes   Smokeless tobacco: Never  Vaping Use   Vaping status: Never Used   Substance Use Topics   Alcohol use: Yes    Alcohol/week: 7.0 standard drinks of alcohol    Types: 7 Cans of beer per week    Comment: sometimes 6 in a day   Drug use: No     Teddy Sharper, FNP 08/20/24 0911  "

## 2024-08-20 NOTE — Discharge Instructions (Addendum)
 Advised patient of foot x-ray results with hardcopy and image provided.  Advised patient discontinue Advil  800 mg tablet and start Celebrex .  Advised take medication with food to completion.  Courage increase daily water intake to 64 ounces per day while taking this medication.  Advised if symptoms worsen and/or unresolved please follow-up with your PCP or here for further evaluation.  Advised if symptoms worsen and/or unresolved please follow-up with your PCP or [facility] podiatry or orthopedics for further evaluation of left foot pain.

## 2024-08-21 ENCOUNTER — Ambulatory Visit: Admitting: Podiatry

## 2024-08-21 ENCOUNTER — Encounter: Payer: Self-pay | Admitting: Podiatry

## 2024-08-21 VITALS — Ht 71.0 in | Wt 240.0 lb

## 2024-08-21 DIAGNOSIS — M10072 Idiopathic gout, left ankle and foot: Secondary | ICD-10-CM | POA: Diagnosis not present

## 2024-08-21 MED ORDER — COLCHICINE 0.6 MG PO TABS: 0.6000 mg | ORAL_TABLET | Freq: Every day | ORAL | 1 refills | Status: AC

## 2024-08-21 MED ORDER — BETAMETHASONE SOD PHOS & ACET 6 (3-3) MG/ML IJ SUSP
3.0000 mg | Freq: Once | INTRAMUSCULAR | Status: AC
  Administered 2024-08-21: 3 mg via INTRA_ARTICULAR

## 2024-08-21 NOTE — Progress Notes (Signed)
" ° °  Chief Complaint  Patient presents with   Foot Pain    Pt is here due to left foot pain, states the pain began about a month no injuries that he can think of, was seen at urgent care yesterday he had x-ray done, states he can barely walk on the foot due to the pain.    HPI: 38 y.o. male presenting today as a new patient for evaluation of pain and tenderness associated to the left foot ongoing for about 1 month now.  He has noticed redness with swelling.  No history of injury.  Past Medical History:  Diagnosis Date   ADHD    Anxiety    Elevated ALT measurement 05/11/2017   AST:ALT 0.5   Hypertension    Hypertriglyceridemia 04/10/2019   Obesity    Pneumonia 2016    Past Surgical History:  Procedure Laterality Date   EVALUATION UNDER ANESTHESIA WITH HEMORRHOIDECTOMY N/A 05/12/2019   Procedure: ANORECTAL EXAM UNDER ANESTHESIA WITH LIFT REPAIR;  Surgeon: Sheldon Standing, MD;  Location: [facility];  Service: General;  Laterality: N/A;   FRACTURE SURGERY Right 2008   Rt knee   FRACTURE SURGERY Right 2016   Rt wrist from old injury   LEG SURGERY     WRIST ARTHROSCOPY      Allergies[1]   Physical Exam: General: The patient is alert and oriented x3 in no acute distress.  Dermatology: Skin is warm, dry and supple bilateral lower extremities.   Vascular: Palpable pedal pulses bilaterally. Capillary refill within normal limits.  Erythema and edema noted left foot  Neurological: Grossly intact via light touch  Musculoskeletal Exam: Diffuse pain and tenderness noted throughout the dorsal and lateral aspect of the left midfoot with associated erythema and edema consistent with gout  DG Foot Complete Left (Accession [id number]) (Order [id number]) Imaging Date: 08/20/2024 Department: [facility] Released By/Authorizing: Teddy Sharper, FNP (auto-released)  IMPRESSION: 1. No acute findings. 2. Mild spurring of the first metatarsophalangeal joint.  Assessment/Plan  of Care: 1.  Acute idiopathic gout left foot  -Patient evaluated.  X-rays reviewed -I do believe the patient is experiencing acute gout episode -Discussed the pathology and etiology of gout.  Avoid foods that are high in purines that can elicit or exacerbate gout attacks -Injection of 0.5 cc Celestone  Soluspan injected into the lateral aspect of the left midfoot -Prescription for colchicine  0.6 mg -Cam boot dispensed.  WBAT -RICE -Order placed for uric acid -Return to clinic 3 weeks      Thresa EMERSON Sar, DPM [facility]  Dr. Thresa EMERSON Sar, DPM    [address], [place] [address]                Office [phone]  Fax [phone]        [1]  Allergies Allergen Reactions   Norvasc  [Amlodipine ]     Feet burning    "
